# Patient Record
Sex: Male | Born: 1950 | Race: Black or African American | Hispanic: No | Marital: Married | Smoking: Never smoker
Health system: Southern US, Community
[De-identification: ages and names within clinical notes are randomized; demographics above are authoritative.]

## PROBLEM LIST (undated history)

## (undated) DIAGNOSIS — Z86010 Personal history of colonic polyps: Secondary | ICD-10-CM

## (undated) DIAGNOSIS — K5900 Constipation, unspecified: Secondary | ICD-10-CM

## (undated) DIAGNOSIS — I10 Essential (primary) hypertension: Secondary | ICD-10-CM

## (undated) DIAGNOSIS — M549 Dorsalgia, unspecified: Secondary | ICD-10-CM

## (undated) DIAGNOSIS — M1990 Unspecified osteoarthritis, unspecified site: Secondary | ICD-10-CM

## (undated) DIAGNOSIS — R011 Cardiac murmur, unspecified: Secondary | ICD-10-CM

## (undated) DIAGNOSIS — T4145XA Adverse effect of unspecified anesthetic, initial encounter: Secondary | ICD-10-CM

## (undated) DIAGNOSIS — Z860101 Personal history of adenomatous and serrated colon polyps: Secondary | ICD-10-CM

## (undated) DIAGNOSIS — M7989 Other specified soft tissue disorders: Secondary | ICD-10-CM

## (undated) DIAGNOSIS — T7840XA Allergy, unspecified, initial encounter: Secondary | ICD-10-CM

## (undated) DIAGNOSIS — T8859XA Other complications of anesthesia, initial encounter: Secondary | ICD-10-CM

## (undated) DIAGNOSIS — E739 Lactose intolerance, unspecified: Secondary | ICD-10-CM

## (undated) DIAGNOSIS — E785 Hyperlipidemia, unspecified: Secondary | ICD-10-CM

## (undated) DIAGNOSIS — M2550 Pain in unspecified joint: Secondary | ICD-10-CM

## (undated) HISTORY — DX: Essential (primary) hypertension: I10

## (undated) HISTORY — DX: Hyperlipidemia, unspecified: E78.5

## (undated) HISTORY — DX: Other specified soft tissue disorders: M79.89

## (undated) HISTORY — DX: Constipation, unspecified: K59.00

## (undated) HISTORY — DX: Unspecified osteoarthritis, unspecified site: M19.90

## (undated) HISTORY — DX: Lactose intolerance, unspecified: E73.9

## (undated) HISTORY — DX: Allergy, unspecified, initial encounter: T78.40XA

## (undated) HISTORY — DX: Dorsalgia, unspecified: M54.9

## (undated) HISTORY — DX: Pain in unspecified joint: M25.50

## (undated) HISTORY — PX: COLONOSCOPY: SHX174

---

## 2009-01-07 ENCOUNTER — Encounter (INDEPENDENT_AMBULATORY_CARE_PROVIDER_SITE_OTHER): Payer: Self-pay | Admitting: *Deleted

## 2009-01-23 ENCOUNTER — Ambulatory Visit: Payer: Self-pay | Admitting: Family Medicine

## 2009-01-23 DIAGNOSIS — E1165 Type 2 diabetes mellitus with hyperglycemia: Secondary | ICD-10-CM

## 2009-01-23 DIAGNOSIS — E1151 Type 2 diabetes mellitus with diabetic peripheral angiopathy without gangrene: Secondary | ICD-10-CM | POA: Insufficient documentation

## 2009-01-24 ENCOUNTER — Telehealth: Payer: Self-pay | Admitting: Family Medicine

## 2009-01-27 ENCOUNTER — Ambulatory Visit: Payer: Self-pay | Admitting: Family Medicine

## 2009-01-27 LAB — CONVERTED CEMR LAB
ALT: 20 units/L (ref 0–53)
AST: 21 units/L (ref 0–37)
Albumin: 3.7 g/dL (ref 3.5–5.2)
Alkaline Phosphatase: 55 units/L (ref 39–117)
BUN: 11 mg/dL (ref 6–23)
Basophils Absolute: 0.1 10*3/uL (ref 0.0–0.1)
Basophils Relative: 1.8 % (ref 0.0–3.0)
Bilirubin, Direct: 0 mg/dL (ref 0.0–0.3)
CO2: 28 meq/L (ref 19–32)
Calcium: 9.2 mg/dL (ref 8.4–10.5)
Chloride: 104 meq/L (ref 96–112)
Creatinine, Ser: 0.9 mg/dL (ref 0.4–1.5)
Creatinine,U: 126.3 mg/dL
Eosinophils Absolute: 0.1 10*3/uL (ref 0.0–0.7)
Eosinophils Relative: 1.8 % (ref 0.0–5.0)
GFR calc non Af Amer: 92.77 mL/min (ref >60)
Glucose, Bld: 163 mg/dL — ABNORMAL HIGH (ref 70–99)
HCT: 43.7 % (ref 39.0–52.0)
Hemoglobin: 14.4 g/dL (ref 13.0–17.0)
Hgb A1c MFr Bld: 8.3 % — ABNORMAL HIGH (ref 4.6–6.5)
Lymphocytes Relative: 19.2 % (ref 12.0–46.0)
Lymphs Abs: 1.5 10*3/uL (ref 0.7–4.0)
MCHC: 32.9 g/dL (ref 30.0–36.0)
MCV: 91.8 fL (ref 78.0–100.0)
Microalb Creat Ratio: 4 mg/g (ref 0.0–30.0)
Microalb, Ur: 0.5 mg/dL (ref 0.0–1.9)
Monocytes Absolute: 0.6 10*3/uL (ref 0.1–1.0)
Monocytes Relative: 7.3 % (ref 3.0–12.0)
Neutro Abs: 5.5 10*3/uL (ref 1.4–7.7)
Neutrophils Relative %: 69.9 % (ref 43.0–77.0)
PSA: 0.29 ng/mL (ref 0.10–4.00)
Platelets: 223 10*3/uL (ref 150.0–400.0)
Potassium: 4.3 meq/L (ref 3.5–5.1)
RBC: 4.76 M/uL (ref 4.22–5.81)
RDW: 13.4 % (ref 11.5–14.6)
Sodium: 139 meq/L (ref 135–145)
TSH: 0.75 microintl units/mL (ref 0.35–5.50)
Total Bilirubin: 0.6 mg/dL (ref 0.3–1.2)
Total Protein: 7.6 g/dL (ref 6.0–8.3)
WBC: 7.8 10*3/uL (ref 4.5–10.5)

## 2009-01-30 ENCOUNTER — Encounter (INDEPENDENT_AMBULATORY_CARE_PROVIDER_SITE_OTHER): Payer: Self-pay | Admitting: *Deleted

## 2009-01-30 ENCOUNTER — Telehealth (INDEPENDENT_AMBULATORY_CARE_PROVIDER_SITE_OTHER): Payer: Self-pay | Admitting: *Deleted

## 2009-02-03 ENCOUNTER — Encounter (INDEPENDENT_AMBULATORY_CARE_PROVIDER_SITE_OTHER): Payer: Self-pay | Admitting: *Deleted

## 2009-02-07 ENCOUNTER — Encounter: Payer: Self-pay | Admitting: Family Medicine

## 2009-02-13 ENCOUNTER — Ambulatory Visit: Payer: Self-pay | Admitting: Internal Medicine

## 2009-03-21 ENCOUNTER — Ambulatory Visit (HOSPITAL_COMMUNITY): Admission: RE | Admit: 2009-03-21 | Discharge: 2009-03-21 | Payer: Self-pay | Admitting: Internal Medicine

## 2009-03-21 ENCOUNTER — Ambulatory Visit: Payer: Self-pay | Admitting: Internal Medicine

## 2009-03-21 ENCOUNTER — Encounter: Payer: Self-pay | Admitting: Internal Medicine

## 2009-03-25 ENCOUNTER — Encounter: Payer: Self-pay | Admitting: Internal Medicine

## 2009-04-22 ENCOUNTER — Ambulatory Visit: Payer: Self-pay | Admitting: Family Medicine

## 2009-04-22 DIAGNOSIS — R12 Heartburn: Secondary | ICD-10-CM

## 2009-04-22 LAB — CONVERTED CEMR LAB
Bilirubin Urine: NEGATIVE
Blood Glucose, Fingerstick: 270
Blood in Urine, dipstick: NEGATIVE
Glucose, Urine, Semiquant: 250
Ketones, urine, test strip: NEGATIVE
Nitrite: NEGATIVE
Protein, U semiquant: NEGATIVE
Specific Gravity, Urine: 1.015
Urobilinogen, UA: 0.2
WBC Urine, dipstick: NEGATIVE
pH: 6.5

## 2009-05-01 ENCOUNTER — Telehealth: Payer: Self-pay | Admitting: Family Medicine

## 2009-05-05 ENCOUNTER — Ambulatory Visit: Payer: Self-pay | Admitting: Family Medicine

## 2009-05-05 DIAGNOSIS — E785 Hyperlipidemia, unspecified: Secondary | ICD-10-CM | POA: Insufficient documentation

## 2009-05-05 LAB — CONVERTED CEMR LAB
ALT: 26 units/L (ref 0–53)
AST: 22 units/L (ref 0–37)
Albumin: 3.8 g/dL (ref 3.5–5.2)
Alkaline Phosphatase: 69 units/L (ref 39–117)
BUN: 7 mg/dL (ref 6–23)
Bilirubin, Direct: 0 mg/dL (ref 0.0–0.3)
Blood Glucose, Fingerstick: 121
CO2: 28 meq/L (ref 19–32)
Calcium: 9.1 mg/dL (ref 8.4–10.5)
Chloride: 100 meq/L (ref 96–112)
Cholesterol: 188 mg/dL (ref 0–200)
Creatinine, Ser: 0.8 mg/dL (ref 0.4–1.5)
Creatinine,U: 126.6 mg/dL
GFR calc non Af Amer: 128.48 mL/min (ref >60)
Glucose, Bld: 245 mg/dL — ABNORMAL HIGH (ref 70–99)
HDL: 40.9 mg/dL (ref >39.00)
Hgb A1c MFr Bld: 10.2 % — ABNORMAL HIGH (ref 4.6–6.5)
LDL Cholesterol: 125 mg/dL — ABNORMAL HIGH (ref 0–99)
Microalb Creat Ratio: 3.9 mg/g (ref 0.0–30.0)
Microalb, Ur: 0.5 mg/dL (ref 0.0–1.9)
Potassium: 4.3 meq/L (ref 3.5–5.1)
Sodium: 136 meq/L (ref 135–145)
Total Bilirubin: 1 mg/dL (ref 0.3–1.2)
Total CHOL/HDL Ratio: 5
Total Protein: 7.6 g/dL (ref 6.0–8.3)
Triglycerides: 110 mg/dL (ref 0.0–149.0)
VLDL: 22 mg/dL (ref 0.0–40.0)

## 2009-06-18 ENCOUNTER — Encounter: Payer: Self-pay | Admitting: Family Medicine

## 2009-07-04 ENCOUNTER — Encounter: Payer: Self-pay | Admitting: Family Medicine

## 2009-07-04 DIAGNOSIS — I739 Peripheral vascular disease, unspecified: Secondary | ICD-10-CM | POA: Insufficient documentation

## 2009-07-08 ENCOUNTER — Encounter (INDEPENDENT_AMBULATORY_CARE_PROVIDER_SITE_OTHER): Payer: Self-pay | Admitting: *Deleted

## 2009-07-22 ENCOUNTER — Ambulatory Visit: Payer: Self-pay

## 2009-07-22 ENCOUNTER — Encounter: Payer: Self-pay | Admitting: Family Medicine

## 2009-08-06 ENCOUNTER — Telehealth (INDEPENDENT_AMBULATORY_CARE_PROVIDER_SITE_OTHER): Payer: Self-pay | Admitting: *Deleted

## 2009-08-06 ENCOUNTER — Ambulatory Visit: Payer: Self-pay | Admitting: Family Medicine

## 2009-08-06 LAB — CONVERTED CEMR LAB
ALT: 18 units/L (ref 0–53)
AST: 18 units/L (ref 0–37)
Albumin: 3.7 g/dL (ref 3.5–5.2)
Alkaline Phosphatase: 55 units/L (ref 39–117)
BUN: 11 mg/dL (ref 6–23)
Bilirubin, Direct: 0.1 mg/dL (ref 0.0–0.3)
CO2: 29 meq/L (ref 19–32)
Calcium: 9.1 mg/dL (ref 8.4–10.5)
Chloride: 107 meq/L (ref 96–112)
Cholesterol: 147 mg/dL (ref 0–200)
Creatinine, Ser: 0.9 mg/dL (ref 0.4–1.5)
Creatinine,U: 117.2 mg/dL
GFR calc non Af Amer: 112.04 mL/min (ref >60)
Glucose, Bld: 110 mg/dL — ABNORMAL HIGH (ref 70–99)
HDL: 47.5 mg/dL (ref >39.00)
Hgb A1c MFr Bld: 6.8 % — ABNORMAL HIGH (ref 4.6–6.5)
LDL Cholesterol: 85 mg/dL (ref 0–99)
Microalb Creat Ratio: 5.1 mg/g (ref 0.0–30.0)
Microalb, Ur: 0.6 mg/dL (ref 0.0–1.9)
Potassium: 4.3 meq/L (ref 3.5–5.1)
Sodium: 140 meq/L (ref 135–145)
Total Bilirubin: 0.4 mg/dL (ref 0.3–1.2)
Total CHOL/HDL Ratio: 3
Total Protein: 7.7 g/dL (ref 6.0–8.3)
Triglycerides: 75 mg/dL (ref 0.0–149.0)
VLDL: 15 mg/dL (ref 0.0–40.0)

## 2009-09-02 ENCOUNTER — Ambulatory Visit: Payer: Self-pay | Admitting: Family

## 2009-09-02 DIAGNOSIS — M5430 Sciatica, unspecified side: Secondary | ICD-10-CM

## 2009-09-22 ENCOUNTER — Telehealth (INDEPENDENT_AMBULATORY_CARE_PROVIDER_SITE_OTHER): Payer: Self-pay | Admitting: *Deleted

## 2009-11-04 ENCOUNTER — Telehealth (INDEPENDENT_AMBULATORY_CARE_PROVIDER_SITE_OTHER): Payer: Self-pay | Admitting: *Deleted

## 2009-11-19 ENCOUNTER — Encounter: Payer: Self-pay | Admitting: Family Medicine

## 2010-03-16 ENCOUNTER — Telehealth: Payer: Self-pay | Admitting: Family Medicine

## 2010-03-17 ENCOUNTER — Ambulatory Visit: Payer: Self-pay | Admitting: Family Medicine

## 2010-03-20 ENCOUNTER — Encounter: Payer: Self-pay | Admitting: Family Medicine

## 2010-03-20 LAB — CONVERTED CEMR LAB
ALT: 18 units/L (ref 0–53)
AST: 18 units/L (ref 0–37)
Albumin: 3.7 g/dL (ref 3.5–5.2)
Alkaline Phosphatase: 64 units/L (ref 39–117)
BUN: 15 mg/dL (ref 6–23)
Bilirubin, Direct: 0.1 mg/dL (ref 0.0–0.3)
CO2: 27 meq/L (ref 19–32)
Calcium: 9 mg/dL (ref 8.4–10.5)
Chloride: 106 meq/L (ref 96–112)
Cholesterol: 206 mg/dL — ABNORMAL HIGH (ref 0–200)
Creatinine, Ser: 1 mg/dL (ref 0.4–1.5)
Direct LDL: 148.7 mg/dL
GFR calc non Af Amer: 101.33 mL/min (ref >60)
Glucose, Bld: 147 mg/dL — ABNORMAL HIGH (ref 70–99)
HDL: 49.5 mg/dL (ref >39.00)
Hgb A1c MFr Bld: 8.3 % — ABNORMAL HIGH (ref 4.6–6.5)
Potassium: 4.4 meq/L (ref 3.5–5.1)
Sodium: 140 meq/L (ref 135–145)
Total Bilirubin: 0.5 mg/dL (ref 0.3–1.2)
Total CHOL/HDL Ratio: 4
Total Protein: 7.1 g/dL (ref 6.0–8.3)
Triglycerides: 108 mg/dL (ref 0.0–149.0)
VLDL: 21.6 mg/dL (ref 0.0–40.0)

## 2010-04-17 ENCOUNTER — Telehealth: Payer: Self-pay | Admitting: Family Medicine

## 2010-06-02 ENCOUNTER — Telehealth (INDEPENDENT_AMBULATORY_CARE_PROVIDER_SITE_OTHER): Payer: Self-pay | Admitting: *Deleted

## 2010-06-04 ENCOUNTER — Ambulatory Visit: Payer: Self-pay | Admitting: Family Medicine

## 2010-06-04 ENCOUNTER — Telehealth (INDEPENDENT_AMBULATORY_CARE_PROVIDER_SITE_OTHER): Payer: Self-pay | Admitting: *Deleted

## 2010-06-10 ENCOUNTER — Telehealth (INDEPENDENT_AMBULATORY_CARE_PROVIDER_SITE_OTHER): Payer: Self-pay | Admitting: *Deleted

## 2010-06-22 ENCOUNTER — Ambulatory Visit
Admission: RE | Admit: 2010-06-22 | Discharge: 2010-06-22 | Payer: Self-pay | Source: Home / Self Care | Attending: Family Medicine | Admitting: Family Medicine

## 2010-06-22 ENCOUNTER — Other Ambulatory Visit: Payer: Self-pay | Admitting: Family Medicine

## 2010-06-22 ENCOUNTER — Encounter: Payer: Self-pay | Admitting: Family Medicine

## 2010-06-22 LAB — BASIC METABOLIC PANEL
BUN: 9 mg/dL (ref 6–23)
CO2: 28 mEq/L (ref 19–32)
Calcium: 9 mg/dL (ref 8.4–10.5)
Chloride: 103 mEq/L (ref 96–112)
Creatinine, Ser: 0.9 mg/dL (ref 0.4–1.5)
GFR: 114.62 mL/min (ref >60.00)
Glucose, Bld: 146 mg/dL — ABNORMAL HIGH (ref 70–99)
Potassium: 4.3 mEq/L (ref 3.5–5.1)
Sodium: 138 mEq/L (ref 135–145)

## 2010-06-22 LAB — MICROALBUMIN / CREATININE URINE RATIO
Creatinine,U: 188.2 mg/dL
Microalb Creat Ratio: 0.8 mg/g (ref 0.0–30.0)
Microalb, Ur: 1.5 mg/dL (ref 0.0–1.9)

## 2010-07-10 ENCOUNTER — Ambulatory Visit
Admission: RE | Admit: 2010-07-10 | Discharge: 2010-07-10 | Payer: Self-pay | Source: Home / Self Care | Attending: Family Medicine | Admitting: Family Medicine

## 2010-07-10 DIAGNOSIS — S61209A Unspecified open wound of unspecified finger without damage to nail, initial encounter: Secondary | ICD-10-CM | POA: Insufficient documentation

## 2010-07-13 ENCOUNTER — Other Ambulatory Visit: Payer: Self-pay | Admitting: Family Medicine

## 2010-07-13 DIAGNOSIS — E119 Type 2 diabetes mellitus without complications: Secondary | ICD-10-CM

## 2010-07-14 NOTE — Letter (Signed)
Summary: Primary Care Consult Scheduled Letter  [facility] [place]  [address] [place], [place] [address]   Phone: [phone]  Fax: [phone]      07/08/2009 MRN: [id number]  [facility] [address] [place], [place]  [address]    Dear Richard [name],    We have scheduled an appointment for you.  At the recommendation of Dr. Loreen Freud, we have scheduled you with Selena Batten on 07-22-2009 at 10:00am for Lower Extremity Arterial Dopplers.  Their address is [address], [address]rd floor, [place] [address]. The office phone number is [phone].  If this appointment day and time is not convenient for you, please feel free to call the office of the doctor you are being referred to at the number listed above and reschedule the appointment.    It is important for you to keep your scheduled appointments. We are here to make sure you are given good patient care.   Thank you,    Renee, Patient Care Coordinator [facility] [place]    **IF YOU ARE UNABLE TO KEEP THIS APPOINTMENT, OR NEED TO RESCHEDULE, PLEASE GIVE  HEARTCARE A 24 HOUR NOTICE TO AVOID A $50 FEE**

## 2010-07-14 NOTE — Progress Notes (Signed)
Summary: Lab Results   Phone Note Outgoing Call   Call placed by: Army Fossa CMA,  August 06, 2009 3:42 PM Summary of Call: Regarding lab results, LMTCB:  Pravachol not strong enough------ change lipitor 20 mg  #30  1 by mouth at bedtime  2 refills------their are coupons in bin for $4 LDL goal < 70   recheck 3 ZOXWRU------045.40 272.4  lipid, hep, bmp, hgba1c   Signed by Loreen Freud DO on 08/06/2009 at 3:37 PM  Follow-up for Phone Call        Pt is aware. will pick up coupon. Army Fossa CMA  August 06, 2009 3:47 PM     New/Updated Medications: LIPITOR 20 MG TABS (ATORVASTATIN CALCIUM) 1 by mouth at beditme. Prescriptions: LIPITOR 20 MG TABS (ATORVASTATIN CALCIUM) 1 by mouth at beditme.  #30 x 2   Entered by:   Army Fossa CMA   Authorized by:   Loreen Freud DO   Signed by:   Army Fossa CMA on 08/06/2009   Method used:   Electronically to        [facility] #[id number]* (retail)       [address] [place], [place]  [address]       Ph: [phone]       Fax: [phone]   RxID:   [id number]

## 2010-07-14 NOTE — Miscellaneous (Signed)
Summary: Orders Update  Clinical Lists Changes  Problems: Added new problem of UNSPECIFIED PERIPHERAL VASCULAR DISEASE (ICD-443.9) Orders: Added new Test order of Arterial Duplex Lower Extremity (Arterial Duplex Low) - Signed 

## 2010-07-14 NOTE — Progress Notes (Signed)
Summary: refill  Phone Note Refill Request   Refills Requested: Medication #1:  VICTOZA 18 MG/3ML SOLN 1.2  subcutaneously daily  Medication #2:  METFORMIN HCL 500 MG XR24H-TAB 1 by mouth every day.  Medication #3:  CLOTRIMAZOLE-BETAMETHASONE 1-0.05 % CREA as directed [facility]......Marland KitchenFelecia Deloach CMA  March 16, 2010 4:30 PM    Follow-up for Phone Call        pls advise med never filled by you, last ov 09-02-09. pt has pending lab appt ...Marland KitchenMarland KitchenFelecia Deloach CMA  March 16, 2010 4:31 PM   Additional Follow-up for Phone Call Additional follow up Details #1::        ok to refill cream x1  1 refills Additional Follow-up by: Loreen Freud DO,  March 16, 2010 4:34 PM    Prescriptions: METFORMIN HCL 500 MG XR24H-TAB (METFORMIN HCL) 1 by mouth every day.  #30 x 0   Entered by:   Jeremy Johann CMA   Authorized by:   Loreen Freud DO   Signed by:   Jeremy Johann CMA on 03/16/2010   Method used:   Faxed to ...       [id number]* (retail)       [address] [place], [place]  [address]       Ph: [phone]       Fax: [phone]   RxID:   [id number] GMWNUUV 18 MG/3ML SOLN (LIRAGLUTIDE) 1.2  subcutaneously daily  #1 month x 0   Entered by:   Jeremy Johann CMA   Authorized by:   Loreen Freud DO   Signed by:   Jeremy Johann CMA on 03/16/2010   Method used:   Faxed to ...       [facility] #[id number]* (retail)       [address] [place], [place]  [address]       Ph: [phone]       Fax: [phone]   RxID:   [id number]

## 2010-07-14 NOTE — Miscellaneous (Signed)
  Clinical Lists Changes  Medications: Removed medication of NOVOLOG FLEXPEN 100 UNIT/ML SOLN (INSULIN ASPART) sliding scale

## 2010-07-14 NOTE — Assessment & Plan Note (Signed)
Summary: ROB--FOR INSU-PROCE-LABS--PH   Vital Signs:  Patient profile:   60 year old male Weight:      368 pounds Temp:     98.4 degrees F oral Pulse rate:   16 / minute Pulse rhythm:   regular BP sitting:   142 / 80  (left arm) Cuff size:   large  Vitals Entered By: Army Fossa CMA (August 06, 2009 9:10 AM) CC: Pt thinks he just needs labs- unsure of why appt was made.  Comments refills on victoza, pravastatin, metformin   History of Present Illness: pt was just supposed to be a lab visit ---  copay should be credited to accout  Allergies (verified): No Known Drug Allergies   Complete Medication List: 1)  Trital Dm 03-17-14 Mg/69ml Liqd (Phenylephrine-chlorphen-dm) .... Take 1 teasoopful q 4 hrs as needed for cough & congestion 2)  Glucophage Xr 500 Mg Xr24h-tab (Metformin hcl) .... 2 by mouth qpm 3)  Cyclobenzaprine Hcl 10 Mg Tabs (Cyclobenzaprine hcl) .... Take 1/2 tab by mouth two times a day for muscle spasm 4)  Silvadene 1 % Crea (Silver sulfadiazine) .... As directed 5)  Clotrimazole-betamethasone 1-0.05 % Crea (Clotrimazole-betamethasone) .... As directed 6)  Onetouch Ultra Test Strp (Glucose blood) .... Test two times a day 7)  Viagra 100 Mg Tabs (Sildenafil citrate) .... As directed 8)  Victoza 18 Mg/57ml Soln (Liraglutide) .... 1.2  subcutaneously daily 9)  Novolog Flexpen 100 Unit/ml Soln (Insulin aspart) .... Sliding scale 10)  Nexium 40 Mg Cpdr (Esomeprazole magnesium) .Marland Kitchen.. 1 by mouth once daily 11)  Metformin Hcl 500 Mg Xr24h-tab (Metformin hcl) .Marland Kitchen.. 1 by mouth every day. 12)  Novofine 32g X 6 Mm Misc (Insulin pen needle) .... Use as directed. 13)  Pravachol 40 Mg Tabs (Pravastatin sodium) .Marland Kitchen.. 1 by mouth at bedtime  Other Orders: Venipuncture ([address]) TLB-Lipid Panel (80061-LIPID) TLB-BMP (Basic Metabolic Panel-BMET) (80048-METABOL) TLB-Hepatic/Liver Function Pnl (80076-HEPATIC) TLB-A1C / Hgb A1C (Glycohemoglobin) (83036-A1C) TLB-Microalbumin/Creat  Ratio, Urine (82043-MALB) No Charge Patient Arrived (NCPA0) (NCPA0) Prescriptions: VICTOZA 18 MG/3ML SOLN (LIRAGLUTIDE) 1.2  subcutaneously daily  #1 month x 5   Entered and Authorized by:   Loreen Freud DO   Signed by:   Loreen Freud DO on 08/06/2009   Method used:   Electronically to        [facility] #[id number]* (retail)       [address] [place], [place]  [address]       Ph: [phone]       Fax: [phone]   RxID:   [id number] METFORMIN HCL 500 MG XR24H-TAB (METFORMIN HCL) 1 by mouth every day.  #30 x 5   Entered and Authorized by:   Loreen Freud DO   Signed by:   Loreen Freud DO on 08/06/2009   Method used:   Electronically to        [facility] #[id number]* (retail)       [address] [place], [place]  [address]       Ph: [phone]       Fax: [phone]   RxID:   [id number] PRAVACHOL 40 MG TABS (PRAVASTATIN SODIUM) 1 by mouth at bedtime  #30 x 5   Entered and Authorized by:   Loreen Freud DO   Signed by:   Loreen Freud DO on 08/06/2009   Method used:   Electronically to        [facility] [address] [id number]* (retail)       [address] [place], [place]  [address]       Ph: [phone]       Fax: [phone]   RxID:   [id number]

## 2010-07-14 NOTE — Letter (Signed)
Summary: [facility]  [facility]   Imported By: Lanelle Bal 11/27/2009 08:37:10  _____________________________________________________________________  External Attachment:    Type:   Image     Comment:   External Document

## 2010-07-14 NOTE — Assessment & Plan Note (Signed)
Summary: back spasms//lch   Vital Signs:  Patient profile:   60 year old male Weight:      371.0 pounds BMI:     53.43 Temp:     97.6 degrees F oral Pulse rate:   120 / minute Pulse rhythm:   regular Resp:     24 per minute BP sitting:   128 / 70  (left arm) Cuff size:   thigh  Vitals Entered By: Mervin Kung CMA (September 02, 2009 9:03 AM) CC: room 16   Back pain in lower back since yesterday. Pain now radiating down left leg.   Primary Care Provider:  Laury Axon  CC:  room 16   Back pain in lower back since yesterday. Pain now radiating down left leg.Marland Kitchen  History of Present Illness: Richard [name] is a 60 year old male who presents today with c/o low back pain radiating down his left leg.  Symptoms started yesterday, has history of the same about 5 years ago, but notes that he generally does not have low back pain.  Has taken advil without relief of his pain.  Tells me pain is 16/10.  Pain started after he picked some trash out of the trunk.  Allergies (verified): No Known Drug Allergies  Physical Exam  General:  Morbidly obese african [place] male who appears to be in a lot of pain.  Head:  Normocephalic and atraumatic without obvious abnormalities. No apparent alopecia or balding. Msk:  normal strength in bilateral extremities.  Exam limited by patient discomfort.   Extremities:  No clubbing, cyanosis, edema, or deformity noted with normal full range of motion of all joints.     Impression & Recommendations:  Problem # 1:  SCIATICA, LEFT (ICD-724.3) Assessment New Will plan to treat with flexeril, mobic, time.  I considered steroids, but given patient's history of insulin dependent diabetes, this would not be a good choice for him.  If no improvement consider referral to PT His updated medication list for this problem includes:    Cyclobenzaprine Hcl 10 Mg Tabs (Cyclobenzaprine hcl) .Marland Kitchen... Take 1/2 tab by mouth two times a day for muscle spasm    Flexeril 10 Mg Tab  (Cyclobenzaprine hcl) .Marland Kitchen... Take one tablet by mouth three times a day as needed    Mobic 7.5 Mg Tabs (Meloxicam) ..... One tablet by mouth daily as needed for back pain  Complete Medication List: 1)  Trital Dm 03-17-14 Mg/28ml Liqd (Phenylephrine-chlorphen-dm) .... Take 1 teasoopful q 4 hrs as needed for cough & congestion 2)  Glucophage Xr 500 Mg Xr24h-tab (Metformin hcl) .... 2 by mouth qpm 3)  Cyclobenzaprine Hcl 10 Mg Tabs (Cyclobenzaprine hcl) .... Take 1/2 tab by mouth two times a day for muscle spasm 4)  Silvadene 1 % Crea (Silver sulfadiazine) .... As directed 5)  Clotrimazole-betamethasone 1-0.05 % Crea (Clotrimazole-betamethasone) .... As directed 6)  Onetouch Ultra Test Strp (Glucose blood) .... Test two times a day 7)  Viagra 100 Mg Tabs (Sildenafil citrate) .... As directed 8)  Victoza 18 Mg/9ml Soln (Liraglutide) .... 1.2  subcutaneously daily 9)  Novolog Flexpen 100 Unit/ml Soln (Insulin aspart) .... Sliding scale 10)  Nexium 40 Mg Cpdr (Esomeprazole magnesium) .Marland Kitchen.. 1 by mouth once daily 11)  Metformin Hcl 500 Mg Xr24h-tab (Metformin hcl) .Marland Kitchen.. 1 by mouth every day. 12)  Novofine 32g X 6 Mm Misc (Insulin pen needle) .... Use as directed. 13)  Lipitor 20 Mg Tabs (Atorvastatin calcium) .Marland Kitchen.. 1 by mouth at beditme. 14)  Flexeril  10 Mg Tab (Cyclobenzaprine hcl) .... Take one tablet by mouth three times a day as needed 15)  Mobic 7.5 Mg Tabs (Meloxicam) .... One tablet by mouth daily as needed for back pain  Patient Instructions: 1)  Most patients (90%) with low back pain will improve with time (2-6 weeks). Keep active but avoid activities that are painful. Apply moist heat and/or ice to lower back several times a day. 2)  Please schedule a follow-up appointment in 2 weeks. Prescriptions: MOBIC 7.5 MG TABS (MELOXICAM) one tablet by mouth daily as needed for back pain  #30 x 0   Entered and Authorized by:   Lemont Fillers FNP   Signed by:   Lemont Fillers FNP on  09/02/2009   Method used:   Electronically to        [facility] #[id number]* (retail)       [address] [place], [place]  [address]       Ph: [phone]       Fax: [phone]   RxID:   [id number] FLEXERIL 10 MG TAB (CYCLOBENZAPRINE HCL) Take one tablet by mouth three times a day as needed  #30 x 0   Entered and Authorized by:   Lemont Fillers FNP   Signed by:   Lemont Fillers FNP on 09/02/2009   Method used:   Electronically to        [facility] #[id number]* (retail)       [address] [place], [place]  [address]       Ph: [phone]       Fax: [phone]   RxID:   [id number]   Current Allergies (reviewed today): No known allergies

## 2010-07-14 NOTE — Progress Notes (Signed)
Summary: quantity limitation on test strips  Phone Note Refill Request Message from:  Fax from Pharmacy on September 22, 2009 3:17 PM  Refills Requested: Medication #1:  ONETOUCH ULTRA TEST  STRP test two times a day prior author fax [phone] [address]   Method Requested: Fax to Local Pharmacy Next Appointment Scheduled: no appt Initial call taken by: Barb Merino,  September 22, 2009 3:18 PM  Follow-up for Phone Call        spoke with  [employer] who says NO PRIOR AUTH IS REQUIRED pt plan pays for a quantity of 153 in a 90 day period. patient has the option of paying out of pocket for the rest. pt test two times a day.  PHARMACIST INFORMED.Kandice Hams  September 22, 2009 4:03 PM  Follow-up by: Kandice Hams,  September 22, 2009 4:17 PM

## 2010-07-14 NOTE — Letter (Signed)
Summary: Letter Regarding Screening for Peripheral Arterial Disease/Activ  Letter Regarding Screening for Peripheral Arterial Disease/[employer]   Imported By: Lanelle Bal 07/01/2009 14:05:36  _____________________________________________________________________  External Attachment:    Type:   Image     Comment:   External Document  Appended Document: Orders Update Inform pt that ins is requesting ABI secondary to DM   Clinical Lists Changes  Orders: Added new Referral order of LE Arterial Doppler/ABI (Le arterial doppler) - Signed      Appended Document: Letter Regarding Screening for Peripheral Arterial Disease/Activ Pt states he no longer has insurance through [employer] anymore. Marland Kitchen

## 2010-07-14 NOTE — Progress Notes (Signed)
Summary: Refill Request  Phone Note Refill Request Message from:  Patient on April 17, 2010 4:20 PM  Refills Requested: Medication #1:  CLOTRIMAZOLE-BETAMETHASONE 1-0.05 % CREA as directed Never filled here, per pt he uses as needed twice a day.Marland KitchenMarland KitchenPlease advise   Method Requested: Electronic Initial call taken by: Almeta Monas CMA Duncan Dull),  April 17, 2010 4:20 PM  Follow-up for Phone Call        ok to fill 30 g tube  1 refill Follow-up by: Loreen Freud DO,  April 17, 2010 8:00 PM    Prescriptions: CLOTRIMAZOLE-BETAMETHASONE 1-0.05 % CREA (CLOTRIMAZOLE-BETAMETHASONE) as directed  #30gram x 1   Entered by:   Almeta Monas CMA (AAMA)   Authorized by:   Loreen Freud DO   Signed by:   Almeta Monas CMA (AAMA) on 04/20/2010   Method used:   Electronically to        [id number]* (retail)       [address] [place], [place]  [address]       Ph: [phone]       Fax: [phone]   RxID:   [id number]

## 2010-07-14 NOTE — Progress Notes (Signed)
Summary: prior auth denied  testing  changed  Phone Note Refill Request Message from:  Pharmacy on [facility] [address]  Refills Requested: Medication #1:  ONETOUCH ULTRA TEST  STRP test two times a day ph [phone] fax [phone] id:[id number].............Marland KitchenFelecia Deloach CMA  Nov 04, 2009 10:29 AM    Follow-up for Phone Call        prior Berkley Harvey can not be approved for two times a day testing due to pt not on insulin. ok to change direction to once daily testing. pls advise.Marti Sleigh Deloach CMA  Nov 04, 2009 10:28 AM   Additional Follow-up for Phone Call Additional follow up Details #1::        yes Additional Follow-up by: Loreen Freud DO,  Nov 04, 2009 10:52 AM    Additional Follow-up for Phone Call Additional follow up Details #2::    left message to call  office to inform pt on change in test.............Marland KitchenFelecia Deloach CMA  Nov 04, 2009 3:08 PM   DISCUSS WITH PATIENT new rx sent to pharmacy...........Marland KitchenFelecia Deloach CMA  Nov 05, 2009 10:40 AM   New/Updated Medications: ONETOUCH ULTRA TEST  STRP (GLUCOSE BLOOD) test once daily Prescriptions: ONETOUCH ULTRA TEST  STRP (GLUCOSE BLOOD) test once daily  #100 x 0   Entered by:   Jeremy Johann CMA   Authorized by:   Loreen Freud DO   Signed by:   Jeremy Johann CMA on 11/05/2009   Method used:   Faxed to ...       [facility] #[id number]* (retail)       [address] [place], [place]  [address]       Ph: [phone]       Fax: [phone]   RxID:   [id number]

## 2010-07-16 ENCOUNTER — Other Ambulatory Visit: Payer: Self-pay

## 2010-07-16 NOTE — Progress Notes (Signed)
Summary: needs chest xray 12/20  Phone Note Call from Patient   Caller: Patient Call For: Loreen Freud DO Details for Reason: Needs CXR Summary of Call: call from patient and he stated he needed to have a PPD done but has had positive results in the past and would like an order for a chest X-Ray. Pt stated he is due to start a new job and needs to have this done soon. c/b # [phone]. Please advise... Initial call taken by: Almeta Monas CMA Duncan Dull),  June 02, 2010 8:05 AM  Follow-up for Phone Call        order printed Follow-up by: Loreen Freud DO,  June 02, 2010 12:14 PM  Additional Follow-up for Phone Call Additional follow up Details #1::        Left message to call back.... Almeta Monas CMA Duncan Dull)  June 02, 2010 1:23 PM   New Problems: SCREENING EXAMINATION FOR PULMONARY TUBERCULOSIS (ICD-V74.1)   Additional Follow-up for Phone Call Additional follow up Details #2::    spoke with pt and Orders left upfront, He also requested a One touch Glucometer, I left that upfront as well.... Almeta Monas CMA Duncan Dull)  June 03, 2010 11:03 AM   New Problems: SCREENING EXAMINATION FOR PULMONARY TUBERCULOSIS (ICD-V74.1)

## 2010-07-16 NOTE — Progress Notes (Signed)
Summary: Sample request  Phone Note Call from Patient   Caller: Patient Call For: Loreen Freud DO Details for Reason: Wants sample of Victoza Summary of Call: Call from patient and he stated he was out of the Victoza and he will not have any money until the 3rd of Jan. I advised I have one sample avail. He stated he needed it because he did not have enough Victoza to last him this month. Stated he can come by and pick it up this morning. I left in the Fridge for patient. Initial call taken by: Almeta Monas CMA Duncan Dull),  June 10, 2010 9:01 AM

## 2010-07-16 NOTE — Progress Notes (Signed)
Summary: Questions about Victoza--12/30  Phone Note Call from Patient Call back at [phone]   Caller: Patient Call For: Loreen Freud DO Details for Reason: Victoza concerns Summary of Call: spoke with patient when he came in to pick up sample of Victoza. Stated he is not sure if the meds are working as good as they should and not sure if he needs to be on something different. Stating he is taking 1.8 of the Victoza in the morning and Metformin 500mg  in the morning as well. His blood sugars had been running from 135-145 in the am and usually in the evening it is lower normally about 100. He wanted to know if he needed to adjust the meds or start something new. Pt wanted to wait to hear from Dr.lowne and is aware she will not be back until Friday 12/30....  Please advise   Follow-up for Phone Call        He is due for labs in January---- recheck labs first then we will discuss  250.00  272.4  lipid, hep, bmp, hgba1c, microalbumin ----  ov 10-14 days after labs Follow-up by: Loreen Freud DO,  June 12, 2010 8:59 AM  Additional Follow-up for Phone Call Additional follow up Details #1::        Left message to call back... Almeta Monas CMA Duncan Dull)  June 12, 2010 11:26 AM  pt aware, lab appt scheduled already.... Almeta Monas CMA Duncan Dull)  June 12, 2010 11:34 AM'

## 2010-07-16 NOTE — Assessment & Plan Note (Signed)
Summary: Review [facility]//Kp   Vital Signs:  Patient profile:   60 year old male Height:      70 inches Weight:      384.6 pounds BMI:     55.38 BP sitting:   138 / 72  (left arm) Cuff size:   thigh  Vitals Entered By: Almeta Monas CMA Duncan Dull) (July 10, 2010 9:44 AM) CC: Review [facility]--Needs refills   History of Present Illness: Pt here to review labs .Marland Kitchen   He cut his L thumb this am at home on aluminum foil.  No other complaints.    Problems Prior to Update: 1)  Screening Examination For Pulmonary Tuberculosis  (ICD-V74.1) 2)  Sciatica, Left  (ICD-724.3) 3)  Unspecified Peripheral Vascular Disease  (ICD-443.9) 4)  Hyperlipidemia  (ICD-272.4) 5)  Heartburn  (ICD-787.1) 6)  Preventive Health Care  (ICD-V70.0) 7)  Diabetes Mellitus, Type II  (ICD-250.00)  Medications Prior to Update: 1)  Trital Dm 03-17-14 Mg/71ml Liqd (Phenylephrine-Chlorphen-Dm) .... Take 1 Teasoopful Q 4 Hrs As Needed For Cough & Congestion 2)  Glucophage Xr 500 Mg Xr24h-Tab (Metformin Hcl) .... 2 By Mouth Qpm 3)  Cyclobenzaprine Hcl 10 Mg Tabs (Cyclobenzaprine Hcl) .... Take 1/2 Tab By Mouth Two Times A Day For Muscle Spasm 4)  Silvadene 1 % Crea (Silver Sulfadiazine) .... As Directed 5)  Clotrimazole-Betamethasone 1-0.05 % Crea (Clotrimazole-Betamethasone) .... As Directed 6)  Onetouch Ultra Test  Strp (Glucose Blood) .... Test Once Daily 7)  Viagra 100 Mg Tabs (Sildenafil Citrate) .... As Directed 8)  Victoza 18 Mg/27ml Soln (Liraglutide) .... 1.8 Subcutaneously Daily 9)  Nexium 40 Mg Cpdr (Esomeprazole Magnesium) .Marland Kitchen.. 1 By Mouth Once Daily 10)  Metformin Hcl 500 Mg Xr24h-Tab (Metformin Hcl) .Marland Kitchen.. 1 By Mouth Every Day. 11)  Novofine 32g X 6 Mm Misc (Insulin Pen Needle) .... Use As Directed. 12)  Lipitor 40 Mg Tabs (Atorvastatin Calcium) .Marland Kitchen.. 1 By Mouth Daily At Bedtime. 13)  Flexeril 10 Mg Tab (Cyclobenzaprine Hcl) .... Take One Tablet By Mouth Three Times A Day As Needed 14)  Mobic  7.5 Mg Tabs (Meloxicam) .... One Tablet By Mouth Daily As Needed For Back Pain 15)  Onetouch Delica Lancets  Misc (Lancets) .... Use As Directed 16)  Onetouch Ultra Blue  Strp (Glucose Blood) .... Use As Directed  Current Medications (verified): 1)  Trital Dm 03-17-14 Mg/17ml Liqd (Phenylephrine-Chlorphen-Dm) .... Take 1 Teasoopful Q 4 Hrs As Needed For Cough & Congestion 2)  Glucophage Xr 500 Mg Xr24h-Tab (Metformin Hcl) .... 2 By Mouth Qpm 3)  Cyclobenzaprine Hcl 10 Mg Tabs (Cyclobenzaprine Hcl) .... Take 1/2 Tab By Mouth Two Times A Day For Muscle Spasm 4)  Silvadene 1 % Crea (Silver Sulfadiazine) .... As Directed 5)  Clotrimazole-Betamethasone 1-0.05 % Crea (Clotrimazole-Betamethasone) .... As Directed 6)  Onetouch Ultra Test  Strp (Glucose Blood) .... Test Once Daily 7)  Viagra 100 Mg Tabs (Sildenafil Citrate) .... As Directed 8)  Victoza 18 Mg/5ml Soln (Liraglutide) .... 1.8 Subcutaneously Daily 9)  Nexium 40 Mg Cpdr (Esomeprazole Magnesium) .Marland Kitchen.. 1 By Mouth Once Daily 10)  Novofine 32g X 6 Mm Misc (Insulin Pen Needle) .... Use As Directed. 11)  Lipitor 40 Mg Tabs (Atorvastatin Calcium) .Marland Kitchen.. 1 By Mouth Daily At Bedtime. 12)  Flexeril 10 Mg Tab (Cyclobenzaprine Hcl) .... Take One Tablet By Mouth Three Times A Day As Needed 13)  Mobic 7.5 Mg Tabs (Meloxicam) .... One Tablet By Mouth Daily As Needed For Back Pain 14)  Onetouch Delica Lancets  Misc (Lancets) .... Use As Directed 15)  Onetouch Ultra Blue  Strp (Glucose Blood) .... Use As Directed  Allergies (verified): No Known Drug Allergies  Past History:  Past Medical History: Last updated: 05/05/2009 Diabetes mellitus, type II Hyperlipidemia  Past Surgical History: Last updated: 01/23/2009 none  Family History: Last updated: 01/23/2009 DM- MOTHER, SISITER, BROTHER HTN-SISTER STROKE-SISTER  Social History: Last updated: 01/23/2009 Married Never Smoked Alcohol use-yes Drug use-no Regular exercise-no  Risk  Factors: Alcohol Use: <1 (01/23/2009) Caffeine Use: 2 CUPS A DAY (01/23/2009) Exercise: no (01/23/2009)  Risk Factors: Smoking Status: never (01/23/2009)  Family History: Reviewed history from 01/23/2009 and no changes required. DM- MOTHER, SISITER, BROTHER HTN-SISTER STROKE-SISTER  Social History: Reviewed history from 01/23/2009 and no changes required. Married Never Smoked Alcohol use-yes Drug use-no Regular exercise-no  Review of Systems      See HPI  Physical Exam  General:  Well-developed,well-nourished,in no acute distress; alert,appropriate and cooperative throughout examination Psych:  Oriented X3 and normally interactive.     Impression & Recommendations:  Problem # 1:  HYPERLIPIDEMIA (ICD-272.4) see [facility] --scanned in His updated medication list for this problem includes:    Lipitor 40 Mg Tabs (Atorvastatin calcium) .Marland Kitchen... 1 by mouth daily at bedtime.  Labs Reviewed: SGOT: 18 (03/17/2010)   SGPT: 18 (03/17/2010)   HDL:49.50 (03/17/2010), 47.50 (08/06/2009)  LDL:85 (08/06/2009), 125 (16/03/9603)  Chol:206 (03/17/2010), 147 (08/06/2009)  Trig:108.0 (03/17/2010), 75.0 (08/06/2009)  Problem # 2:  DIABETES MELLITUS, TYPE II (ICD-250.00) Increase glucophage 2 by mouth once daily.    The following medications were removed from the medication list:    Metformin Hcl 500 Mg Xr24h-tab (Metformin hcl) .Marland Kitchen... 1 by mouth every day. His updated medication list for this problem includes:    Glucophage Xr 500 Mg Xr24h-tab (Metformin hcl) .Marland Kitchen... 2 by mouth qpm    Victoza 18 Mg/51ml Soln (Liraglutide) .Marland Kitchen... 1.8 subcutaneously daily  Orders: Radiology Referral (Radiology)  Labs Reviewed: Creat: 0.9 (06/22/2010)    Reviewed HgBA1c results: 8.3 (03/17/2010)  6.8 (08/06/2009)  Problem # 3:  Family Hx of SOLITARY KIDNEY (ICD-753.0)  Orders: Radiology Referral (Radiology)  Problem # 4:  WOUND, FINGER (ICD-883.0)  Complete Medication List: 1)  Trital Dm  03-17-14 Mg/39ml Liqd (Phenylephrine-chlorphen-dm) .... Take 1 teasoopful q 4 hrs as needed for cough & congestion 2)  Glucophage Xr 500 Mg Xr24h-tab (Metformin hcl) .... 2 by mouth qpm 3)  Cyclobenzaprine Hcl 10 Mg Tabs (Cyclobenzaprine hcl) .... Take 1/2 tab by mouth two times a day for muscle spasm 4)  Silvadene 1 % Crea (Silver sulfadiazine) .... As directed 5)  Clotrimazole-betamethasone 1-0.05 % Crea (Clotrimazole-betamethasone) .... As directed 6)  Onetouch Ultra Test Strp (Glucose blood) .... Test once daily 7)  Viagra 100 Mg Tabs (Sildenafil citrate) .... As directed 8)  Victoza 18 Mg/3ml Soln (Liraglutide) .... 1.8 subcutaneously daily 9)  Nexium 40 Mg Cpdr (Esomeprazole magnesium) .Marland Kitchen.. 1 by mouth once daily 10)  Novofine 32g X 6 Mm Misc (Insulin pen needle) .... Use as directed. 11)  Lipitor 40 Mg Tabs (Atorvastatin calcium) .Marland Kitchen.. 1 by mouth daily at bedtime. 12)  Flexeril 10 Mg Tab (Cyclobenzaprine hcl) .... Take one tablet by mouth three times a day as needed 13)  Mobic 7.5 Mg Tabs (Meloxicam) .... One tablet by mouth daily as needed for back pain 14)  Onetouch Delica Lancets Misc (Lancets) .... Use as directed 15)  Onetouch Ultra Blue Strp (Glucose blood) .... Use as directed  Patient Instructions: 1)  f/u with dietician -- that is coming here 2)  recheck labs 3 months----  272.4  [facility], bmp Prescriptions: LIPITOR 40 MG TABS (ATORVASTATIN CALCIUM) 1 by mouth daily at bedtime.  #30 x 2   Entered by:   Almeta Monas CMA (AAMA)   Authorized by:   Loreen Freud DO   Signed by:   Almeta Monas CMA (AAMA) on 07/10/2010   Method used:   Faxed to ...       [facility] #[id number]* (retail)       [address] [place], [place]  [address]       Ph: [phone]       Fax: [phone]   RxID:   [id number] VOZDGUY 18 MG/3ML SOLN (LIRAGLUTIDE) 1.8 subcutaneously daily  #1 month x 2   Entered by:   Almeta Monas CMA (AAMA)   Authorized by:   Loreen Freud  DO   Signed by:   Almeta Monas CMA (AAMA) on 07/10/2010   Method used:   Faxed to ...       [facility] #[id number]* (retail)       [address] [place], [place]  [address]       Ph: [phone]       Fax: [phone]   RxID:   [id number] GLUCOPHAGE XR 500 MG XR24H-TAB (METFORMIN HCL) 2 by mouth qpm  #60 x 2   Entered and Authorized by:   Loreen Freud DO   Signed by:   Loreen Freud DO on 07/10/2010   Method used:   Electronically to        [facility] #[id number]* (retail)       [address] [place], [place]  [address]       Ph: [phone]       Fax: [phone]   RxID:   [id number]    Orders Added: 1)  Radiology Referral [Radiology] 2)  Est. Patient Level III [[address]]

## 2010-07-16 NOTE — Progress Notes (Signed)
Summary: Rx fof lancets and test strips  Phone Note Outgoing Call Call back at [phone] wife cell   Caller: Patient Call placed by: Almeta Monas CMA Duncan Dull),  June 04, 2010 3:14 PM Call placed to: Patient Summary of Call: Advise of chext Xray results.Marland KitchenMarland KitchenMarland KitchenPt voiced understanding. Stated that he needed lancet and test strips faxed to [facility] [address].    New/Updated Medications: ONETOUCH DELICA LANCETS  MISC (LANCETS) use as directed ONETOUCH ULTRA BLUE  STRP (GLUCOSE BLOOD) use as directed Prescriptions: ONETOUCH ULTRA BLUE  STRP (GLUCOSE BLOOD) use as directed  #100 x 1   Entered by:   Almeta Monas CMA (AAMA)   Authorized by:   Loreen Freud DO   Signed by:   Almeta Monas CMA (AAMA) on 06/04/2010   Method used:   Faxed to ...       [facility] #[id number]* (retail)       [address] [place], [place]  [address]       Ph: [phone]       Fax: [phone]   RxID:   [id number] Dola Argyle LANCETS  MISC (LANCETS) use as directed  #100 x 1   Entered by:   Almeta Monas CMA (AAMA)   Authorized by:   Loreen Freud DO   Signed by:   Almeta Monas CMA (AAMA) on 06/04/2010   Method used:   Faxed to ...       [facility] #[id number]* (retail)       [address] [place], [place]  [address]       Ph: [phone]       Fax: [phone]   RxID:   [id number]

## 2010-07-17 ENCOUNTER — Encounter: Payer: Self-pay | Admitting: Family Medicine

## 2010-07-17 ENCOUNTER — Ambulatory Visit
Admission: RE | Admit: 2010-07-17 | Discharge: 2010-07-17 | Disposition: A | Discharge status: Home / Self Care | Payer: BC Managed Care – PPO | Source: Ambulatory Visit | Attending: Family Medicine | Admitting: Family Medicine

## 2010-07-17 DIAGNOSIS — E119 Type 2 diabetes mellitus without complications: Secondary | ICD-10-CM

## 2010-08-17 ENCOUNTER — Ambulatory Visit (INDEPENDENT_AMBULATORY_CARE_PROVIDER_SITE_OTHER): Payer: BC Managed Care – PPO | Admitting: Family Medicine

## 2010-08-17 ENCOUNTER — Encounter: Payer: Self-pay | Admitting: Family Medicine

## 2010-08-17 DIAGNOSIS — J019 Acute sinusitis, unspecified: Secondary | ICD-10-CM

## 2010-08-17 DIAGNOSIS — J209 Acute bronchitis, unspecified: Secondary | ICD-10-CM

## 2010-08-25 NOTE — Assessment & Plan Note (Signed)
Summary: Sinus Infection//KP   Vital Signs:  Patient profile:   60 year old male Weight:      380.6 pounds Temp:     98.6 degrees F oral BP sitting:   130 / 70  (left arm) Cuff size:   thigh  Vitals Entered By: Almeta Monas CMA Duncan Dull) (August 17, 2010 10:23 AM) CC: x4days c/o sinus inf, URI symptoms   History of Present Illness:       This is a 60 year old man who presents with URI symptoms.  b/l sinus pressure.   Using dayquil and nyquil with no relief.  The patient complains of nasal congestion, purulent nasal discharge, productive cough, and sick contacts.  The patient also reports headache.  The patient denies itchy watery eyes, itchy throat, sneezing, seasonal symptoms, response to antihistamine, muscle aches, and severe fatigue.  The patient denies the following risk factors for Strep sinusitis: unilateral facial pain, unilateral nasal discharge, poor response to decongestant, double sickening, tooth pain, Strep exposure, tender adenopathy, and absence of cough.    Current Medications (verified): 1)  Trital Dm 03-17-14 Mg/57ml Liqd (Phenylephrine-Chlorphen-Dm) .... Take 1 Teasoopful Q 4 Hrs As Needed For Cough & Congestion 2)  Glucophage Xr 500 Mg Xr24h-Tab (Metformin Hcl) .... 2 By Mouth Qpm 3)  Cyclobenzaprine Hcl 10 Mg Tabs (Cyclobenzaprine Hcl) .... Take 1/2 Tab By Mouth Two Times A Day For Muscle Spasm 4)  Silvadene 1 % Crea (Silver Sulfadiazine) .... As Directed 5)  Clotrimazole-Betamethasone 1-0.05 % Crea (Clotrimazole-Betamethasone) .... As Directed 6)  Onetouch Ultra Test  Strp (Glucose Blood) .... Test Once Daily 7)  Viagra 100 Mg Tabs (Sildenafil Citrate) .... As Directed 8)  Victoza 18 Mg/35ml Soln (Liraglutide) .... 1.8 Subcutaneously Daily 9)  Nexium 40 Mg Cpdr (Esomeprazole Magnesium) .Marland Kitchen.. 1 By Mouth Once Daily 10)  Novofine 32g X 6 Mm Misc (Insulin Pen Needle) .... Use As Directed. 11)  Lipitor 40 Mg Tabs (Atorvastatin Calcium) .Marland Kitchen.. 1 By Mouth Daily At Bedtime. 12)   Flexeril 10 Mg Tab (Cyclobenzaprine Hcl) .... Take One Tablet By Mouth Three Times A Day As Needed 13)  Mobic 7.5 Mg Tabs (Meloxicam) .... One Tablet By Mouth Daily As Needed For Back Pain 14)  Onetouch Delica Lancets  Misc (Lancets) .... Use As Directed 15)  Onetouch Ultra Blue  Strp (Glucose Blood) .... Use As Directed  Allergies (verified): No Known Drug Allergies  Physical Exam  General:  Well-developed,well-nourished,in no acute distress; alert,appropriate and cooperative throughout examination Ears:  External ear exam shows no significant lesions or deformities.  Otoscopic examination reveals clear canals, tympanic membranes are intact bilaterally without bulging, retraction, inflammation or discharge. Hearing is grossly normal bilaterally. Nose:  L frontal sinus tenderness, L maxillary sinus tenderness, R frontal sinus tenderness, and R maxillary sinus tenderness.   Mouth:  Oral mucosa and oropharynx without lesions or exudates.  Teeth in good repair. Neck:  No deformities, masses, or tenderness noted. Lungs:  R decreased breath sounds and L decreased breath sounds.   Heart:  normal rate and no murmur.   Extremities:  No clubbing, cyanosis, edema, or deformity noted with normal full range of motion of all joints.   Psych:  Oriented X3 and good eye contact.     Impression & Recommendations:  Problem # 1:  SINUSITIS - ACUTE-NOS (ICD-461.9)  His updated medication list for this problem includes:    Trital Dm 03-17-14 Mg/14ml Liqd (Phenylephrine-chlorphen-dm) .Marland Kitchen... Take 1 teasoopful q 4 hrs as needed for  cough & congestion    Ceftin 500 Mg Tabs (Cefuroxime axetil) .Marland Kitchen... 1 by mouth two times a day    Cheratussin Ac 100-10 Mg/58ml Syrp (Guaifenesin-codeine) .Marland Kitchen... 1-2 tsp by mouth at bedtime as needed cough    Veramyst 27.5 Mcg/spray Susp (Fluticasone furoate) .Marland Kitchen... 2 sprays each nostril once daily  Problem # 2:  BRONCHITIS- ACUTE (ICD-466.0)  His updated medication list for this  problem includes:    Trital Dm 03-17-14 Mg/33ml Liqd (Phenylephrine-chlorphen-dm) .Marland Kitchen... Take 1 teasoopful q 4 hrs as needed for cough & congestion    Ceftin 500 Mg Tabs (Cefuroxime axetil) .Marland Kitchen... 1 by mouth two times a day    Cheratussin Ac 100-10 Mg/33ml Syrp (Guaifenesin-codeine) .Marland Kitchen... 1-2 tsp by mouth at bedtime as needed cough    Mucinex for daytime  Complete Medication List: 1)  Trital Dm 03-17-14 Mg/36ml Liqd (Phenylephrine-chlorphen-dm) .... Take 1 teasoopful q 4 hrs as needed for cough & congestion 2)  Glucophage Xr 500 Mg Xr24h-tab (Metformin hcl) .... 2 by mouth qpm 3)  Cyclobenzaprine Hcl 10 Mg Tabs (Cyclobenzaprine hcl) .... Take 1/2 tab by mouth two times a day for muscle spasm 4)  Silvadene 1 % Crea (Silver sulfadiazine) .... As directed 5)  Clotrimazole-betamethasone 1-0.05 % Crea (Clotrimazole-betamethasone) .... As directed 6)  Onetouch Ultra Test Strp (Glucose blood) .... Test once daily 7)  Viagra 100 Mg Tabs (Sildenafil citrate) .... As directed 8)  Victoza 18 Mg/78ml Soln (Liraglutide) .... 1.8 subcutaneously daily 9)  Nexium 40 Mg Cpdr (Esomeprazole magnesium) .Marland Kitchen.. 1 by mouth once daily 10)  Novofine 32g X 6 Mm Misc (Insulin pen needle) .... Use as directed. 11)  Lipitor 40 Mg Tabs (Atorvastatin calcium) .Marland Kitchen.. 1 by mouth daily at bedtime. 12)  Flexeril 10 Mg Tab (Cyclobenzaprine hcl) .... Take one tablet by mouth three times a day as needed 13)  Mobic 7.5 Mg Tabs (Meloxicam) .... One tablet by mouth daily as needed for back pain 14)  Onetouch Delica Lancets Misc (Lancets) .... Use as directed 15)  Onetouch Ultra Blue Strp (Glucose blood) .... Use as directed 16)  Ceftin 500 Mg Tabs (Cefuroxime axetil) .Marland Kitchen.. 1 by mouth two times a day 17)  Cheratussin Ac 100-10 Mg/79ml Syrp (Guaifenesin-codeine) .Marland Kitchen.. 1-2 tsp by mouth at bedtime as needed cough 18)  Veramyst 27.5 Mcg/spray Susp (Fluticasone furoate) .... 2 sprays each nostril once daily Prescriptions: CHERATUSSIN AC 100-10 MG/5ML  SYRP (GUAIFENESIN-CODEINE) 1-2 tsp by mouth at bedtime as needed cough  #6 oz x 0   Entered and Authorized by:   Loreen Freud DO   Signed by:   Loreen Freud DO on 08/17/2010   Method used:   Print then Give to Patient   RxID:   [id number] CEFTIN 500 MG TABS (CEFUROXIME AXETIL) 1 by mouth two times a day  #20 x 0   Entered and Authorized by:   Loreen Freud DO   Signed by:   Loreen Freud DO on 08/17/2010   Method used:   Electronically to        [facility] #[id number]* (retail)       [address] [place], [place]  [address]       Ph: [phone]       Fax: [phone]   RxID:   [id number]    Orders Added: 1)  Est. Patient Level III [[address]]

## 2010-09-02 ENCOUNTER — Telehealth (INDEPENDENT_AMBULATORY_CARE_PROVIDER_SITE_OTHER): Payer: Self-pay | Admitting: *Deleted

## 2010-09-04 ENCOUNTER — Other Ambulatory Visit: Payer: Self-pay | Admitting: *Deleted

## 2010-09-04 MED ORDER — MOMETASONE FUROATE 50 MCG/ACT NA SUSP: 2.0000 | Freq: Every day | NASAL | Status: DC

## 2010-09-04 MED ORDER — FLUTICASONE FUROATE 27.5 MCG/SPRAY NA SUSP: 2.0000 | Freq: Every day | NASAL | Status: DC

## 2010-09-04 NOTE — Telephone Encounter (Signed)
Preferred medications fluticasone, flunisolide, nasonex, triamcinolone. Please advise on prior auth or med changed.Felecia Gian Ybarra CMA

## 2010-09-04 NOTE — Telephone Encounter (Signed)
Left message to call office to inform Pt that insurance will not pay for veramyst. Per dr Laury Axon ok to change to nasonex.Felecia Elnore Cosens CMA

## 2010-09-04 NOTE — Telephone Encounter (Signed)
Spoke with patient and I advised his Rx was denied because not covered under ins and advised he will need to try something else. Ok with trying Nasonex again.Marland KitchenMarland KitchenMarland KitchenFaxed to [facility] [phone]    KP

## 2010-09-10 NOTE — Progress Notes (Signed)
Summary: Veramyst refill  Phone Note Refill Request Message from:  Patient on September 02, 2010 8:12 AM  Refills Requested: Medication #1:  VERAMYST 27.5 MCG/SPRAY SUSP 2 sprays each nostril once daily.  Method Requested: Electronic Initial call taken by: Almeta Monas CMA Duncan Dull),  September 02, 2010 8:12 AM    Prescriptions: VERAMYST 27.5 MCG/SPRAY SUSP (FLUTICASONE FUROATE) 2 sprays each nostril once daily  #1 x 2   Entered by:   Almeta Monas CMA (AAMA)   Authorized by:   Loreen Freud DO   Signed by:   Almeta Monas CMA (AAMA) on 09/02/2010   Method used:   Faxed to ...       [facility] #[id number]* (retail)       [address] [place], [place]  [address]       Ph: [phone]       Fax: [phone]   RxID:   [id number]

## 2010-09-17 LAB — GLUCOSE, CAPILLARY: Glucose-Capillary: 165 mg/dL — ABNORMAL HIGH (ref 70–99)

## 2010-10-07 ENCOUNTER — Other Ambulatory Visit: Payer: Self-pay

## 2010-10-07 MED ORDER — METFORMIN HCL ER 500 MG PO TB24: 1000.0000 mg | ORAL_TABLET | Freq: Every evening | ORAL | Status: DC

## 2010-10-07 MED ORDER — ATORVASTATIN CALCIUM 40 MG PO TABS: 40.0000 mg | ORAL_TABLET | Freq: Every day | ORAL | Status: DC

## 2010-10-07 MED ORDER — LIRAGLUTIDE 18 MG/3ML ~~LOC~~ SOLN: 1.8000 mg | Freq: Every day | SUBCUTANEOUS | Status: DC

## 2010-10-15 ENCOUNTER — Other Ambulatory Visit (INDEPENDENT_AMBULATORY_CARE_PROVIDER_SITE_OTHER): Payer: BC Managed Care – PPO

## 2010-10-15 DIAGNOSIS — E785 Hyperlipidemia, unspecified: Secondary | ICD-10-CM

## 2010-10-15 LAB — BASIC METABOLIC PANEL
CO2: 26 mEq/L (ref 19–32)
Calcium: 9.2 mg/dL (ref 8.4–10.5)
Chloride: 102 mEq/L (ref 96–112)
Creatinine, Ser: 1 mg/dL (ref 0.4–1.5)
Glucose, Bld: 106 mg/dL — ABNORMAL HIGH (ref 70–99)

## 2010-11-10 ENCOUNTER — Other Ambulatory Visit: Payer: Self-pay

## 2010-11-10 MED ORDER — GLUCOSE BLOOD VI STRP: ORAL_STRIP | Status: DC

## 2010-11-10 MED ORDER — LIRAGLUTIDE 18 MG/3ML ~~LOC~~ SOLN: 1.8000 mg | Freq: Every day | SUBCUTANEOUS | Status: DC

## 2010-11-11 ENCOUNTER — Ambulatory Visit (INDEPENDENT_AMBULATORY_CARE_PROVIDER_SITE_OTHER): Payer: BC Managed Care – PPO | Admitting: Family Medicine

## 2010-11-11 ENCOUNTER — Encounter: Payer: Self-pay | Admitting: Family Medicine

## 2010-11-11 DIAGNOSIS — E119 Type 2 diabetes mellitus without complications: Secondary | ICD-10-CM

## 2010-11-11 DIAGNOSIS — E785 Hyperlipidemia, unspecified: Secondary | ICD-10-CM

## 2010-11-11 NOTE — Assessment & Plan Note (Signed)
con't meds con't diet and exercise

## 2010-11-11 NOTE — Assessment & Plan Note (Signed)
Controlled con't meds Recheck labs 3 months

## 2010-11-11 NOTE — Progress Notes (Signed)
  Subjective:    Patient ID: Richard [name], male    DOB: [birth year], 60 y.o.   MRN: [id number]  HPI Pt here review [facility].  No new complaints.    Review of Systems As above    Objective:   Physical Exam  Constitutional: He appears well-developed and well-nourished.  Psychiatric: He has a normal mood and affect. His behavior is normal.          Assessment & Plan:

## 2010-11-12 ENCOUNTER — Encounter: Payer: Self-pay | Admitting: Family Medicine

## 2010-12-06 ENCOUNTER — Other Ambulatory Visit: Payer: Self-pay | Admitting: Family Medicine

## 2010-12-11 ENCOUNTER — Ambulatory Visit (INDEPENDENT_AMBULATORY_CARE_PROVIDER_SITE_OTHER): Payer: BC Managed Care – PPO | Admitting: Family Medicine

## 2010-12-11 ENCOUNTER — Other Ambulatory Visit: Payer: Self-pay | Admitting: Family Medicine

## 2010-12-11 ENCOUNTER — Encounter: Payer: Self-pay | Admitting: Family Medicine

## 2010-12-11 VITALS — BP 140/96 | HR 83 | Temp 97.9°F | Wt 379.0 lb

## 2010-12-11 DIAGNOSIS — S91209A Unspecified open wound of unspecified toe(s) with damage to nail, initial encounter: Secondary | ICD-10-CM | POA: Insufficient documentation

## 2010-12-11 DIAGNOSIS — S91109A Unspecified open wound of unspecified toe(s) without damage to nail, initial encounter: Secondary | ICD-10-CM

## 2010-12-11 MED ORDER — HYDROCODONE-ACETAMINOPHEN 7.5-750 MG PO TABS: 30.0000 | ORAL_TABLET | Freq: Four times a day (QID) | ORAL | Status: DC | PRN

## 2010-12-11 MED ORDER — HYDROCODONE-ACETAMINOPHEN 7.5-750 MG PO TABS: 1.0000 | ORAL_TABLET | Freq: Four times a day (QID) | ORAL | Status: DC | PRN

## 2010-12-11 NOTE — Progress Notes (Signed)
  Subjective:    Patient ID: Richard [name], male    DOB: 28-Apr-1951, 60 y.o.   MRN: [id number]  HPI  Pt here c/o toenail hanging off after stubbing toe this am.  No other complaints.  Review of Systems    as above Objective:   Physical Exam  Constitutional: He appears well-developed and well-nourished.  Skin:         in place before rubber band cut off.  Post op shoe also given to pt.      Assessment & Plan:

## 2010-12-11 NOTE — Assessment & Plan Note (Signed)
Keep bandage on 24 hours then may remove Can clean with H2O2 if needed.  rto mon or tues to check toe

## 2010-12-11 NOTE — Patient Instructions (Signed)
Nail Avulsion Injury You have lost a fingernail or toenail. The nail will usually grow back normally in 1-3 months. If your injury damaged the growth center of the nail, the nail may be deformed, split, or not stuck to the nail bed. Sometimes the avulsed nail is stitched back in place. This provides temporary protection to the nail bed until the new nail grows in.  HOME CARE INSTRUCTIONS  Keep your injury elevated as much as possible.   Protect the injury covered with dressings or splints as recommended.   Change dressings as instructed.  SEEK MEDICAL CARE IF:  There is increasing pain, redness, or swelling.   You have can not move your fingers or toes.  Document Released: 07/08/2004 Document Re-Released: 05/19/2009 [facility] Patient Information 2011 [place], [place].

## 2010-12-14 ENCOUNTER — Ambulatory Visit: Payer: BC Managed Care – PPO | Admitting: Family Medicine

## 2010-12-14 ENCOUNTER — Encounter: Payer: Self-pay | Admitting: Family Medicine

## 2010-12-14 DIAGNOSIS — S91209A Unspecified open wound of unspecified toe(s) with damage to nail, initial encounter: Secondary | ICD-10-CM

## 2010-12-14 NOTE — Progress Notes (Signed)
  Subjective:    Patient ID: Richard [name], male    DOB: 27-Apr-1951, 60 y.o.   MRN: [id number]  HPI  Pt here to have [place] look at his toe.  No complaints.    Review of Systems As above.    Objective:   Physical Exam  Constitutional: He is oriented to person, place, and time. He appears well-developed and well-nourished.  Musculoskeletal:       L big toe--- still weeping Bandage put back in place  Neurological: He is alert and oriented to person, place, and time.          Assessment & Plan:

## 2010-12-14 NOTE — Assessment & Plan Note (Signed)
Wound check----  Keflex qid for 10 days rto 1 week

## 2010-12-17 ENCOUNTER — Encounter: Payer: Self-pay | Admitting: Family Medicine

## 2010-12-17 ENCOUNTER — Ambulatory Visit (INDEPENDENT_AMBULATORY_CARE_PROVIDER_SITE_OTHER): Payer: BC Managed Care – PPO | Admitting: Family Medicine

## 2010-12-17 VITALS — BP 130/70 | HR 85 | Temp 98.9°F | Wt 381.4 lb

## 2010-12-17 DIAGNOSIS — S91109A Unspecified open wound of unspecified toe(s) without damage to nail, initial encounter: Secondary | ICD-10-CM

## 2010-12-17 DIAGNOSIS — S91209A Unspecified open wound of unspecified toe(s) with damage to nail, initial encounter: Secondary | ICD-10-CM

## 2010-12-17 MED ORDER — CEPHALEXIN 500 MG PO CAPS: 500.0000 mg | ORAL_CAPSULE | Freq: Four times a day (QID) | ORAL | Status: AC

## 2010-12-17 NOTE — Assessment & Plan Note (Signed)
Finish abx Keep covered Will refer to podiatry if symptoms persist

## 2010-12-17 NOTE — Progress Notes (Signed)
  Subjective:    Patient ID: Richard [name], male    DOB: 22-Apr-1951, 60 y.o.   MRN: [id number]  HPI Pt here c/o toe starting to bleed Tuesday and he had difficulty getting it to stop --it did finally.  Pt also c/o heaviness in toe at night only.  Also Abx were not at pharmacy when he got there.    Review of Systems    as above Objective:   Physical Exam  Constitutional: He is oriented to person, place, and time. He appears well-developed and well-nourished.  Musculoskeletal:       L first toe---  No oozing today,  Still slightly red No swelling  Neurological: He is alert and oriented to person, place, and time.  Psychiatric: He has a normal mood and affect. His behavior is normal. Judgment and thought content normal.          Assessment & Plan:

## 2011-01-14 ENCOUNTER — Other Ambulatory Visit: Payer: BC Managed Care – PPO

## 2011-01-18 ENCOUNTER — Other Ambulatory Visit: Payer: Self-pay | Admitting: Family Medicine

## 2011-01-18 DIAGNOSIS — E785 Hyperlipidemia, unspecified: Secondary | ICD-10-CM

## 2011-01-18 DIAGNOSIS — E119 Type 2 diabetes mellitus without complications: Secondary | ICD-10-CM

## 2011-01-19 ENCOUNTER — Other Ambulatory Visit (INDEPENDENT_AMBULATORY_CARE_PROVIDER_SITE_OTHER): Payer: BC Managed Care – PPO

## 2011-01-19 DIAGNOSIS — E785 Hyperlipidemia, unspecified: Secondary | ICD-10-CM

## 2011-01-19 DIAGNOSIS — E119 Type 2 diabetes mellitus without complications: Secondary | ICD-10-CM

## 2011-01-19 LAB — BASIC METABOLIC PANEL
BUN: 14 mg/dL (ref 6–23)
Calcium: 8.9 mg/dL (ref 8.4–10.5)
Creatinine, Ser: 0.9 mg/dL (ref 0.4–1.5)
GFR: 115.91 mL/min (ref >60.00)
Glucose, Bld: 118 mg/dL — ABNORMAL HIGH (ref 70–99)

## 2011-01-19 LAB — HEPATIC FUNCTION PANEL
ALT: 16 U/L (ref 0–53)
AST: 18 U/L (ref 0–37)
Alkaline Phosphatase: 60 U/L (ref 39–117)
Bilirubin, Direct: 0 mg/dL (ref 0.0–0.3)
Total Bilirubin: 0.6 mg/dL (ref 0.3–1.2)

## 2011-01-19 LAB — LIPID PANEL: HDL: 48.7 mg/dL (ref >39.00)

## 2011-01-19 NOTE — Progress Notes (Signed)
Labs only

## 2011-01-25 ENCOUNTER — Telehealth: Payer: Self-pay | Admitting: *Deleted

## 2011-01-25 DIAGNOSIS — E785 Hyperlipidemia, unspecified: Secondary | ICD-10-CM

## 2011-01-25 NOTE — Telephone Encounter (Signed)
Pt aware of results. And orders in epic.

## 2011-01-28 ENCOUNTER — Telehealth: Payer: Self-pay | Admitting: *Deleted

## 2011-01-28 MED ORDER — METFORMIN HCL ER 750 MG PO TB24: ORAL_TABLET | ORAL | Status: DC

## 2011-01-28 NOTE — Telephone Encounter (Signed)
Discuss with patient rx sent, Letter Mail

## 2011-01-28 NOTE — Telephone Encounter (Signed)
Message copied by Verdene Rio on Thu Jan 28, 2011  4:04 PM ------      Message from: Lelon Perla      Created: Wed Jan 20, 2011  6:15 PM       DM not controlled------ should increase victoza to 1.8 mg -----if already doing that -----increase Glucophage xr 750 mg  2 po qd  #60,  2 refills      Cholesterol is great--con't all other meds      Repeat 3 months    250.00  272.4  Lipid, hep, bmp, hgba1c

## 2011-02-08 ENCOUNTER — Telehealth: Payer: Self-pay

## 2011-02-08 MED ORDER — LIRAGLUTIDE 18 MG/3ML ~~LOC~~ SOLN: 1.8000 mg | Freq: Every day | SUBCUTANEOUS | Status: DC

## 2011-02-08 MED ORDER — ATORVASTATIN CALCIUM 40 MG PO TABS: 40.0000 mg | ORAL_TABLET | Freq: Every day | ORAL | Status: DC

## 2011-02-08 NOTE — Telephone Encounter (Signed)
He can have the one ---tell him we have not been getting much and we can call in a rx

## 2011-02-08 NOTE — Telephone Encounter (Signed)
Patient called and requested a samples of Viagra. Only 1 sample left...Marland KitchenMarland KitchenPlease advise      KP

## 2011-02-08 NOTE — Telephone Encounter (Signed)
Refill called in and sample given to patient     KP

## 2011-04-30 ENCOUNTER — Other Ambulatory Visit: Payer: Self-pay

## 2011-04-30 MED ORDER — METFORMIN HCL ER 750 MG PO TB24: ORAL_TABLET | ORAL | Status: DC

## 2011-04-30 NOTE — Telephone Encounter (Signed)
Refill request----Rx in the system             KP

## 2011-05-10 ENCOUNTER — Other Ambulatory Visit: Payer: Self-pay | Admitting: Family Medicine

## 2011-05-10 DIAGNOSIS — E785 Hyperlipidemia, unspecified: Secondary | ICD-10-CM

## 2011-05-10 DIAGNOSIS — E119 Type 2 diabetes mellitus without complications: Secondary | ICD-10-CM

## 2011-05-11 ENCOUNTER — Other Ambulatory Visit (INDEPENDENT_AMBULATORY_CARE_PROVIDER_SITE_OTHER): Payer: BC Managed Care – PPO

## 2011-05-11 ENCOUNTER — Other Ambulatory Visit: Payer: Self-pay

## 2011-05-11 DIAGNOSIS — E785 Hyperlipidemia, unspecified: Secondary | ICD-10-CM

## 2011-05-11 DIAGNOSIS — E119 Type 2 diabetes mellitus without complications: Secondary | ICD-10-CM

## 2011-05-11 LAB — BASIC METABOLIC PANEL
Chloride: 104 mEq/L (ref 96–112)
Potassium: 3.9 mEq/L (ref 3.5–5.1)
Sodium: 138 mEq/L (ref 135–145)

## 2011-05-11 LAB — LIPID PANEL
HDL: 42.7 mg/dL (ref >39.00)
LDL Cholesterol: 59 mg/dL (ref 0–99)
Total CHOL/HDL Ratio: 3
Triglycerides: 99 mg/dL (ref 0.0–149.0)

## 2011-05-11 LAB — HEPATIC FUNCTION PANEL
Albumin: 3.7 g/dL (ref 3.5–5.2)
Bilirubin, Direct: 0 mg/dL (ref 0.0–0.3)
Total Protein: 7.5 g/dL (ref 6.0–8.3)

## 2011-05-11 MED ORDER — SILDENAFIL CITRATE 100 MG PO TABS: 100.0000 mg | ORAL_TABLET | ORAL | Status: DC | PRN

## 2011-05-11 NOTE — Telephone Encounter (Signed)
Patient requested Viagra 100 mg Hard script so he can take it to the pharmacy. Please advise    KP

## 2011-05-11 NOTE — Telephone Encounter (Signed)
Ok to refill 

## 2011-05-11 NOTE — Telephone Encounter (Signed)
Patient aware Rx ready for pick up.      KP 

## 2011-05-11 NOTE — Progress Notes (Signed)
12  

## 2011-05-24 ENCOUNTER — Other Ambulatory Visit: Payer: Self-pay

## 2011-05-24 MED ORDER — INSULIN PEN NEEDLE 32G X 6 MM MISC: 1.0000 "pen " | Freq: Every day | Status: DC

## 2011-05-24 MED ORDER — ATORVASTATIN CALCIUM 40 MG PO TABS: 40.0000 mg | ORAL_TABLET | Freq: Every day | ORAL | Status: DC

## 2011-05-24 MED ORDER — LIRAGLUTIDE 18 MG/3ML ~~LOC~~ SOLN: 1.8000 mg | Freq: Every day | SUBCUTANEOUS | Status: DC

## 2011-05-24 MED ORDER — METFORMIN HCL ER 750 MG PO TB24: ORAL_TABLET | ORAL | Status: DC

## 2011-05-24 MED ORDER — GLUCOSE BLOOD VI STRP: ORAL_STRIP | Status: AC

## 2011-06-27 ENCOUNTER — Other Ambulatory Visit: Payer: Self-pay | Admitting: Family Medicine

## 2011-06-28 ENCOUNTER — Other Ambulatory Visit: Payer: Self-pay

## 2011-06-28 MED ORDER — LIRAGLUTIDE 18 MG/3ML ~~LOC~~ SOLN: 1.8000 mg | Freq: Every day | SUBCUTANEOUS | Status: DC

## 2011-06-28 NOTE — Telephone Encounter (Signed)
RX filled on 05/24/11 with 5 refills.  Verified with Megan at pharmacy.  RX denied.

## 2011-08-10 ENCOUNTER — Encounter: Payer: Self-pay | Admitting: Family Medicine

## 2011-08-10 ENCOUNTER — Ambulatory Visit (INDEPENDENT_AMBULATORY_CARE_PROVIDER_SITE_OTHER): Payer: BC Managed Care – PPO | Admitting: Family Medicine

## 2011-08-10 VITALS — BP 132/74 | HR 76 | Temp 98.4°F | Wt 375.0 lb

## 2011-08-10 DIAGNOSIS — J329 Chronic sinusitis, unspecified: Secondary | ICD-10-CM

## 2011-08-10 MED ORDER — GUAIFENESIN-CODEINE 100-10 MG/5ML PO SYRP: ORAL_SOLUTION | ORAL | Status: DC

## 2011-08-10 MED ORDER — CLOTRIMAZOLE-BETAMETHASONE 1-0.05 % EX CREA: TOPICAL_CREAM | Freq: Two times a day (BID) | CUTANEOUS | Status: DC

## 2011-08-10 MED ORDER — CEFUROXIME AXETIL 500 MG PO TABS: 500.0000 mg | ORAL_TABLET | Freq: Two times a day (BID) | ORAL | Status: AC

## 2011-08-10 NOTE — Progress Notes (Signed)
  Subjective:     Richard [name] is a 61 y.o. male who presents for evaluation of sinus pain. Symptoms include: congestion, cough, nasal congestion, sinus pressure and sore throat. Onset of symptoms was 4 days ago. Symptoms have been gradually worsening since that time. Past history is significant for no history of pneumonia or bronchitis. Patient is a non-smoker.  The following portions of the patient's history were reviewed and updated as appropriate: allergies, current medications, past family history, past medical history, past social history, past surgical history and problem list.  Review of Systems Pertinent items are noted in HPI.   Objective:    BP 132/74  Pulse 76  Temp(Src) 98.4 F (36.9 C) (Oral)  Wt 375 lb (170.099 kg)  SpO2 97% General appearance: alert, cooperative, appears stated age and no distress Ears: normal TM's and external ear canals both ears Nose: green discharge, mild congestion, sinus tenderness bilateral Throat: abnormal findings: mild oropharyngeal erythema and PND Neck: mild anterior cervical adenopathy and thyroid not enlarged, symmetric, no tenderness/mass/nodules Lungs: clear to auscultation bilaterally Heart: S1, S2 normal Extremities: extremities normal, atraumatic, no cyanosis or edema Lymph nodes: Cervical adenopathy: b/l    Assessment:    Acute bacterial sinusitis.    Plan:    Nasal steroids per medication orders. Ceftin per medication orders. f/u prn

## 2011-08-10 NOTE — Patient Instructions (Signed)

## 2011-12-02 ENCOUNTER — Other Ambulatory Visit: Payer: Self-pay

## 2011-12-02 MED ORDER — LIRAGLUTIDE 18 MG/3ML ~~LOC~~ SOLN: 1.8000 mg | Freq: Every day | SUBCUTANEOUS | Status: DC

## 2011-12-03 ENCOUNTER — Other Ambulatory Visit (INDEPENDENT_AMBULATORY_CARE_PROVIDER_SITE_OTHER): Payer: BC Managed Care – PPO

## 2011-12-03 DIAGNOSIS — IMO0002 Reserved for concepts with insufficient information to code with codable children: Secondary | ICD-10-CM

## 2011-12-03 DIAGNOSIS — E785 Hyperlipidemia, unspecified: Secondary | ICD-10-CM

## 2011-12-03 LAB — LIPID PANEL
Cholesterol: 111 mg/dL (ref 0–200)
LDL Cholesterol: 51 mg/dL (ref 0–99)
Triglycerides: 91 mg/dL (ref 0.0–149.0)
VLDL: 18.2 mg/dL (ref 0.0–40.0)

## 2011-12-03 LAB — HEPATIC FUNCTION PANEL
ALT: 15 U/L (ref 0–53)
Albumin: 3.7 g/dL (ref 3.5–5.2)
Alkaline Phosphatase: 64 U/L (ref 39–117)
Bilirubin, Direct: 0 mg/dL (ref 0.0–0.3)
Total Protein: 7.2 g/dL (ref 6.0–8.3)

## 2011-12-03 LAB — HEMOGLOBIN A1C: Hgb A1c MFr Bld: 6.8 % — ABNORMAL HIGH (ref 4.6–6.5)

## 2011-12-03 LAB — BASIC METABOLIC PANEL
BUN: 13 mg/dL (ref 6–23)
Chloride: 104 mEq/L (ref 96–112)
Creatinine, Ser: 1.1 mg/dL (ref 0.4–1.5)
GFR: 91.01 mL/min (ref >60.00)

## 2011-12-03 LAB — MICROALBUMIN / CREATININE URINE RATIO: Microalb, Ur: 0.8 mg/dL (ref 0.0–1.9)

## 2011-12-06 MED ORDER — ATORVASTATIN CALCIUM 40 MG PO TABS: 40.0000 mg | ORAL_TABLET | Freq: Every day | ORAL | Status: DC

## 2011-12-06 MED ORDER — METFORMIN HCL ER 750 MG PO TB24: ORAL_TABLET | ORAL | Status: DC

## 2011-12-24 ENCOUNTER — Other Ambulatory Visit: Payer: Self-pay

## 2011-12-24 MED ORDER — SILDENAFIL CITRATE 100 MG PO TABS: 100.0000 mg | ORAL_TABLET | ORAL | Status: DC | PRN

## 2011-12-24 MED ORDER — METFORMIN HCL ER 750 MG PO TB24: ORAL_TABLET | ORAL | Status: DC

## 2011-12-24 MED ORDER — ATORVASTATIN CALCIUM 40 MG PO TABS: 40.0000 mg | ORAL_TABLET | Freq: Every day | ORAL | Status: DC

## 2011-12-24 NOTE — Telephone Encounter (Signed)
Patient is aware to pick up the Rx today.   KP

## 2011-12-24 NOTE — Telephone Encounter (Signed)
patient would like an RX for Viagra with refills. Please advise     KP

## 2011-12-24 NOTE — Telephone Encounter (Signed)
Message copied by Arnette Norris on Fri Dec 24, 2011  8:41 AM ------      Message from: Lelon Perla      Created: Fri Dec 24, 2011  8:29 AM       Ok to put 3 refills on viagra

## 2012-01-27 ENCOUNTER — Ambulatory Visit (INDEPENDENT_AMBULATORY_CARE_PROVIDER_SITE_OTHER): Payer: BC Managed Care – PPO | Admitting: Family Medicine

## 2012-01-27 ENCOUNTER — Encounter: Payer: Self-pay | Admitting: Family Medicine

## 2012-01-27 VITALS — BP 120/72 | HR 88 | Temp 98.3°F | Wt 375.0 lb

## 2012-01-27 DIAGNOSIS — J329 Chronic sinusitis, unspecified: Secondary | ICD-10-CM

## 2012-01-27 DIAGNOSIS — J019 Acute sinusitis, unspecified: Secondary | ICD-10-CM

## 2012-01-27 DIAGNOSIS — L853 Xerosis cutis: Secondary | ICD-10-CM

## 2012-01-27 DIAGNOSIS — J40 Bronchitis, not specified as acute or chronic: Secondary | ICD-10-CM

## 2012-01-27 MED ORDER — AMMONIUM LACTATE 12 % EX CREA: TOPICAL_CREAM | CUTANEOUS | Status: AC | PRN

## 2012-01-27 MED ORDER — GUAIFENESIN-CODEINE 100-10 MG/5ML PO SYRP
ORAL_SOLUTION | ORAL | Status: DC
Start: 2012-01-27 — End: 2013-02-06

## 2012-01-27 MED ORDER — AMOXICILLIN-POT CLAVULANATE 875-125 MG PO TABS
1.0000 | ORAL_TABLET | Freq: Two times a day (BID) | ORAL | Status: AC
Start: 2012-01-27 — End: 2012-02-06

## 2012-01-27 MED ORDER — PREDNISONE 10 MG PO TABS: ORAL_TABLET | ORAL | Status: DC

## 2012-01-27 NOTE — Patient Instructions (Signed)
Sinusitis  Sinuses are air pockets within the bones of your face. The growth of bacteria within a sinus leads to infection. The infection prevents the sinuses from draining. This infection is called sinusitis.  SYMPTOMS   There will be different areas of pain depending on which sinuses have become infected.   The maxillary sinuses often produce pain beneath the eyes.   Frontal sinusitis may cause pain in the middle of the forehead and above the eyes.  Other problems (symptoms) include:   Toothaches.   Colored, pus-like (purulent) drainage from the nose.   Swelling, warmth, and tenderness over the sinus areas may be signs of infection.  TREATMENT   Sinusitis is most often determined by an exam.X-rays may be taken. If x-rays have been taken, make sure you obtain your results or find out how you are to obtain them. Your caregiver may give you medications (antibiotics). These are medications that will help kill the bacteria causing the infection. You may also be given a medication (decongestant) that helps to reduce sinus swelling.   HOME CARE INSTRUCTIONS    Only take over-the-counter or prescription medicines for pain, discomfort, or fever as directed by your caregiver.   Drink extra fluids. Fluids help thin the mucus so your sinuses can drain more easily.   Applying either moist heat or ice packs to the sinus areas may help relieve discomfort.   Use saline nasal sprays to help moisten your sinuses. The sprays can be found at your local drugstore.  SEEK IMMEDIATE MEDICAL CARE IF:   You have a fever.   You have increasing pain, severe headaches, or toothache.   You have nausea, vomiting, or drowsiness.   You develop unusual swelling around the face or trouble seeing.  MAKE SURE YOU:    Understand these instructions.   Will watch your condition.   Will get help right away if you are not doing well or get worse.  Document Released: 05/31/2005 Document Revised: 05/20/2011 Document Reviewed:  12/28/2006  ExitCare Patient Information 2012 [employer]    Bronchitis  Bronchitis is the body's way of reacting to injury and/or infection (inflammation) of the bronchi. Bronchi are the air tubes that extend from the windpipe into the lungs. If the inflammation becomes severe, it may cause shortness of breath.  CAUSES   Inflammation may be caused by:   A virus.   Germs (bacteria).   Dust.   Allergens.   Pollutants and many other irritants.  The cells lining the bronchial tree are covered with tiny hairs (cilia). These constantly beat upward, away from the lungs, toward the mouth. This keeps the lungs free of pollutants. When these cells become too irritated and are unable to do their job, mucus begins to develop. This causes the characteristic cough of bronchitis. The cough clears the lungs when the cilia are unable to do their job. Without either of these protective mechanisms, the mucus would settle in the lungs. Then you would develop pneumonia.  Smoking is a common cause of bronchitis and can contribute to pneumonia. Stopping this habit is the single most important thing you can do to help yourself.  TREATMENT    Your caregiver may prescribe an antibiotic if the cough is caused by bacteria. Also, medicines that open up your airways make it easier to breathe. Your caregiver may also recommend or prescribe an expectorant. It will loosen the mucus to be coughed up. Only take over-the-counter or prescription medicines for pain, discomfort, or fever as directed   by your caregiver.   Removing whatever causes the problem (smoking, for example) is critical to preventing the problem from getting worse.   Cough suppressants may be prescribed for relief of cough symptoms.   Inhaled medicines may be prescribed to help with symptoms now and to help prevent problems from returning.   For those with recurrent (chronic) bronchitis, there may be a need for steroid medicines.  SEEK IMMEDIATE MEDICAL CARE IF:     During treatment, you develop more pus-like mucus (purulent sputum).   You have a fever.   Your baby is older than 3 months with a rectal temperature of 102 F (38.9 C) or higher.   Your baby is 3 months old or younger with a rectal temperature of 100.4 F (38 C) or higher.   You become progressively more ill.   You have increased difficulty breathing, wheezing, or shortness of breath.  It is necessary to seek immediate medical care if you are elderly or sick from any other disease.  MAKE SURE YOU:    Understand these instructions.   Will watch your condition.   Will get help right away if you are not doing well or get worse.  Document Released: 05/31/2005 Document Revised: 05/20/2011 Document Reviewed: 04/09/2008  [employer] Patient Information 2012 [employer]

## 2012-01-27 NOTE — Progress Notes (Signed)
  Subjective:     Richard [name] is a 61 y.o. male who presents for evaluation of sinus pain. Symptoms include: congestion, fevers, nasal congestion and sinus pressure and prod cough. Onset of symptoms was 6 days ago. Symptoms have been gradually worsening since that time. Past history is significant for no history of pneumonia or bronchitis. Patient is a non-smoker. Took cheratussin and mucinex with some relief.    The following portions of the patient's history were reviewed and updated as appropriate: allergies, current medications, past family history, past medical history, past social history, past surgical history and problem list.  Review of Systems Pertinent items are noted in HPI.   Objective:    BP 120/72  Pulse 88  Temp 98.3 F (36.8 C) (Oral)  Wt 375 lb (170.099 kg)  SpO2 95% General appearance: alert, cooperative, appears stated age and no distress Ears: normal TM's and external ear canals both ears Nose: green discharge, moderate congestion, turbinates red, swollen, sinus tenderness bilateral Throat: lips, mucosa, and tongue normal; teeth and gums normal Neck: mild anterior cervical adenopathy, supple, symmetrical, trachea midline and thyroid not enlarged, symmetric, no tenderness/mass/nodules Lungs: diminished breath sounds bilaterally Heart: S1, S2 normal    Assessment:    Acute bacterial sinusitis.    Plan:    Nasal steroids per medication orders. Augmentin per medication orders. pred taper--- reg insulin sliding scale given to be used prn  cheratussin prn

## 2012-02-24 ENCOUNTER — Ambulatory Visit (INDEPENDENT_AMBULATORY_CARE_PROVIDER_SITE_OTHER): Payer: BC Managed Care – PPO | Admitting: Family Medicine

## 2012-02-24 ENCOUNTER — Encounter: Payer: Self-pay | Admitting: Family Medicine

## 2012-02-24 VITALS — BP 126/92 | HR 103 | Temp 98.4°F | Wt 371.4 lb

## 2012-02-24 DIAGNOSIS — S139XXA Sprain of joints and ligaments of unspecified parts of neck, initial encounter: Secondary | ICD-10-CM

## 2012-02-24 DIAGNOSIS — S161XXA Strain of muscle, fascia and tendon at neck level, initial encounter: Secondary | ICD-10-CM

## 2012-02-24 MED ORDER — NONFORMULARY OR COMPOUNDED ITEM: Status: DC

## 2012-02-24 NOTE — Patient Instructions (Addendum)
Cervical Sprain  A cervical sprain is when the ligaments in the neck stretch or tear. The ligaments are the tissues that hold the neck bones in place.  HOME CARE    Put ice on the injured area.   Put ice in a plastic bag.   Place a towel between your skin and the bag.   Leave the ice on for 15 to 20 minutes, 3 to 4 times a day.   Only take medicine as told by your doctor.   Keep all doctor visits as told.   Keep all physical therapy visits as told.   If your doctor gives you a neck collar, wear it as told.   Do not drive while wearing a neck collar.   Adjust your work station so that you have good posture while you work.   Avoid positions and activities that make your problems worse.   Warm up and stretch before being active.  GET HELP RIGHT AWAY IF:    You are bleeding or your stomach is upset.   You have an allergic reaction to your medicine.   Your problems (symptoms) get worse.   You develop new problems.   You lose feeling (numbness) or you cannot move (paralysis) any part of your body.   You have tingling or weakness in any part of your body.   Your pain is not controlled with medicine.   You cannot take less pain medicine over time as planned.   Your activity level does not improve as expected.  MAKE SURE YOU:    Understand these instructions.   Will watch your condition.   Will get help right away if you are not doing well or get worse.  Document Released: 11/17/2007 Document Revised: 05/20/2011 Document Reviewed: 03/04/2011  [employer] Patient Information 2012 [employer]

## 2012-02-25 ENCOUNTER — Other Ambulatory Visit: Payer: Self-pay

## 2012-02-25 MED ORDER — CYCLOBENZAPRINE HCL 10 MG PO TABS: 10.0000 mg | ORAL_TABLET | Freq: Three times a day (TID) | ORAL | Status: AC | PRN

## 2012-02-25 NOTE — Telephone Encounter (Signed)
Call from patient and the flexeril did not make it to the pharmacy. Rx refaxed       KP

## 2012-02-27 ENCOUNTER — Encounter: Payer: Self-pay | Admitting: Family Medicine

## 2012-02-27 NOTE — Progress Notes (Signed)
  Subjective:     Richard [name] is a 61 y.o. male who presents for evaluation of neck pain. Event that precipitated these symptoms: not sure. Onset of sy ago, and have been unchanged since that time.  . Patient denies numbness tingling, cp, sob.. Patient has had no prior problems.. Previous treatments: none   Review of Systems as above    Objective:    BP 126/92  Pulse 103  Temp 98.4 F (36.9 C) (Oral)  Wt 371 lb 6.4 oz (168.466 kg)  SpO2 97% General:   AAOx3  NAD  External Deformity:  absent  ROM Cervical Spine:   full ROM  Midline Tenderness:  none  Paraspinous tenderness:  R side  UE Neurologic Exam:  normal   X-ray of the cervical spine:not donel    Assessment:     cervical strain/sprain  Plan:     warm compresses Muscle relaxer Chiropractor rto prn

## 2012-03-15 IMAGING — US US RENAL
1 series · 14 of 25 positions shown · non-contrast
Comparison: None.

CLINICAL DATA: Diabetes, family history of solitary kidney into
brothers

RENAL/URINARY TRACT ULTRASOUND COMPLETE

[Series 1: us renal · 0.35mm/px · 14 of 27 slices shown]
[im 1/27]
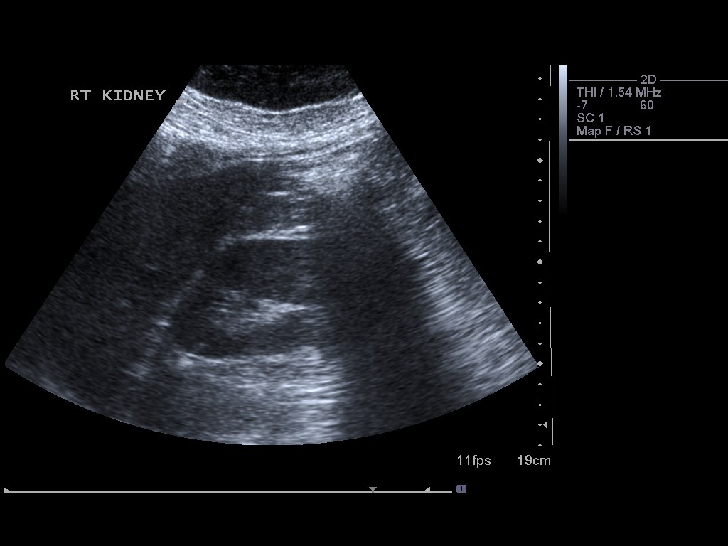
[im 3/27]
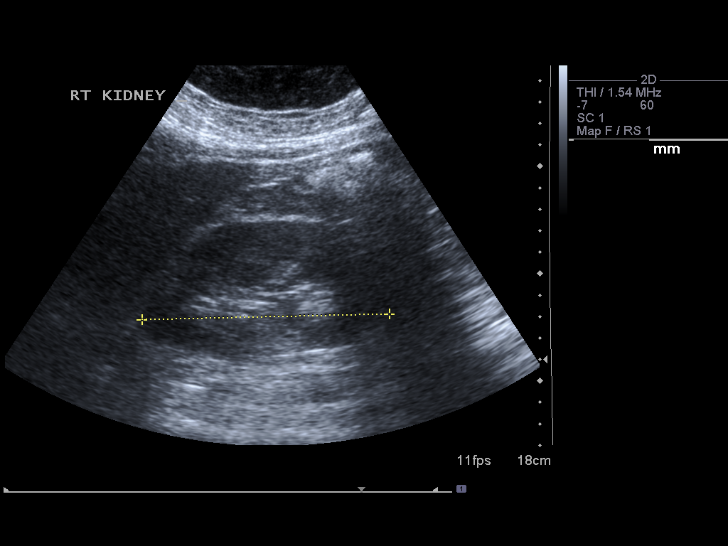
[im 5/27]
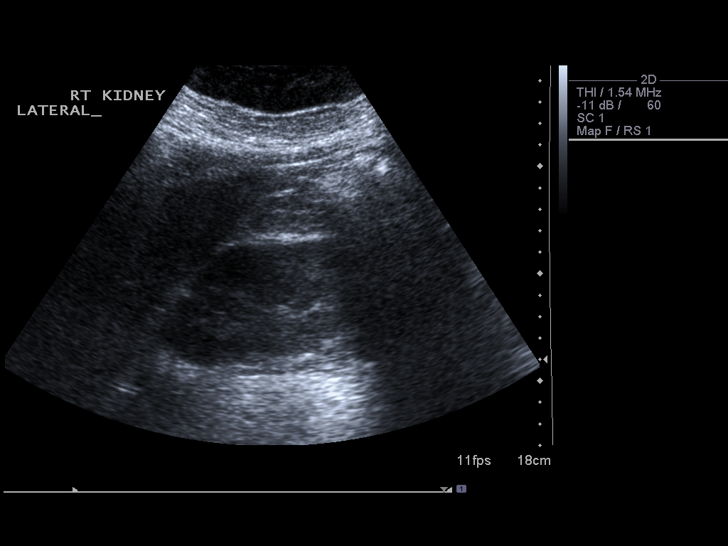
[im 7/27]
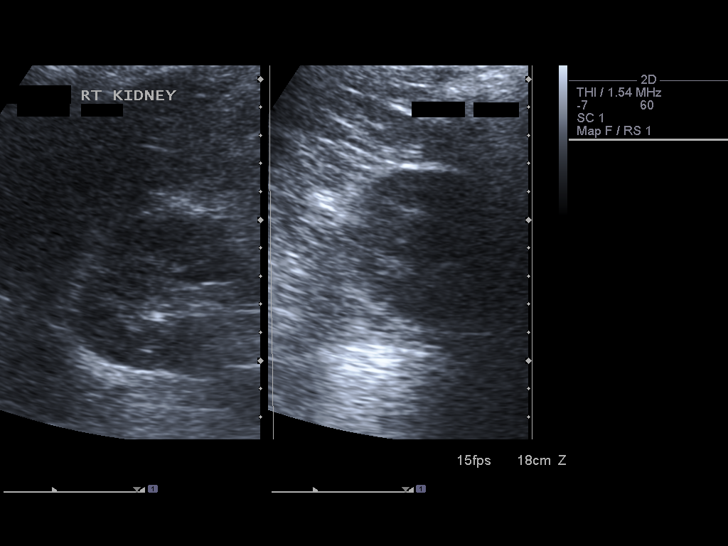
[im 9/27]
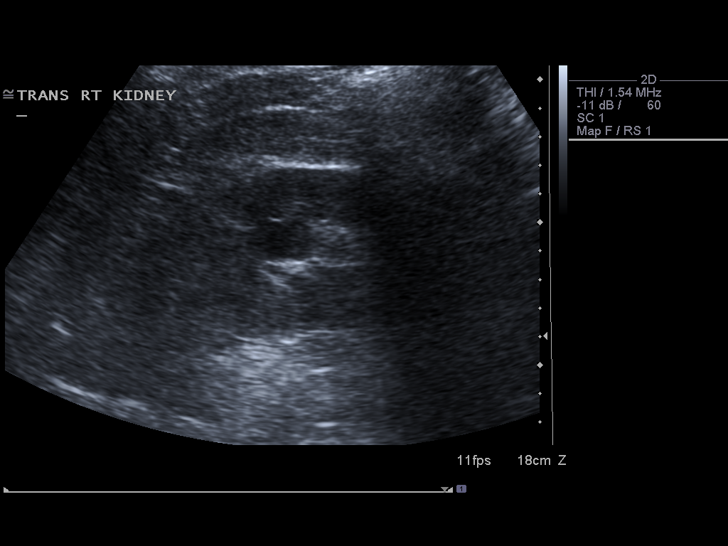
[im 10/27]
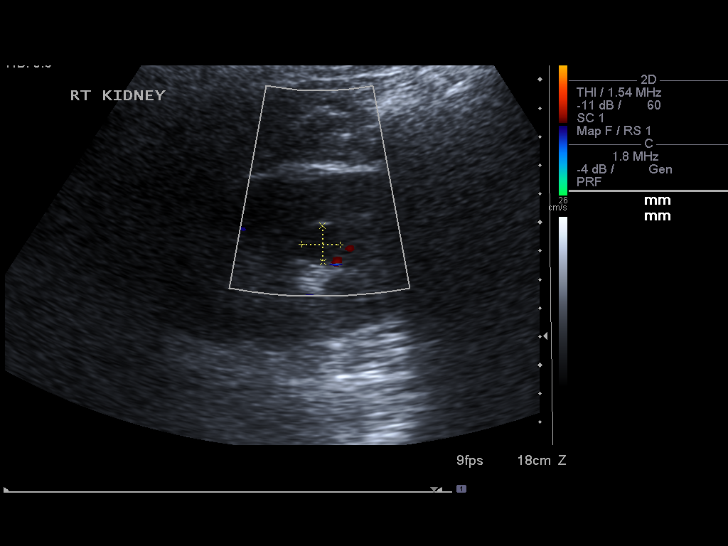
[im 12/27]
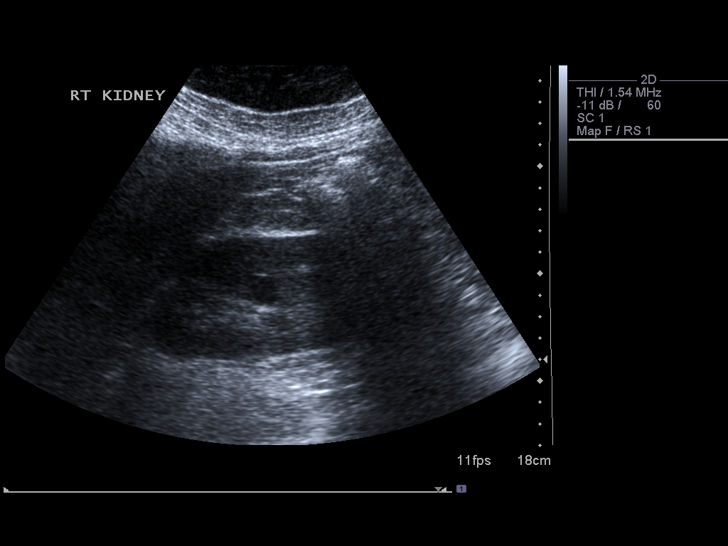
[im 15/27]
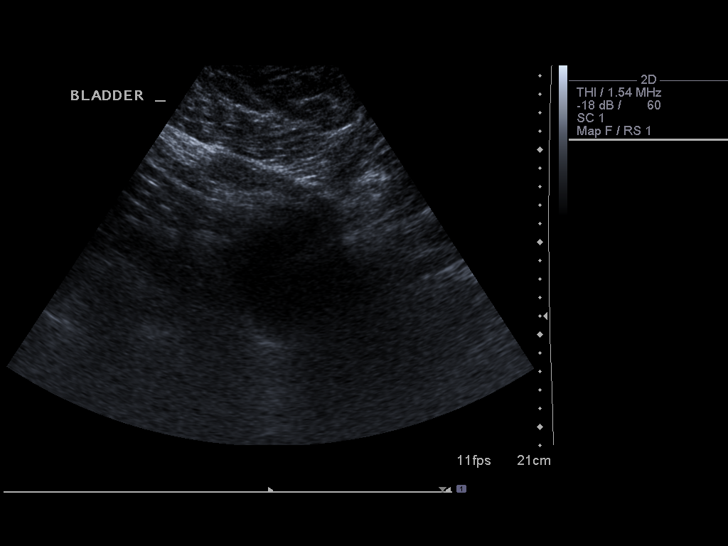
[im 17/27]
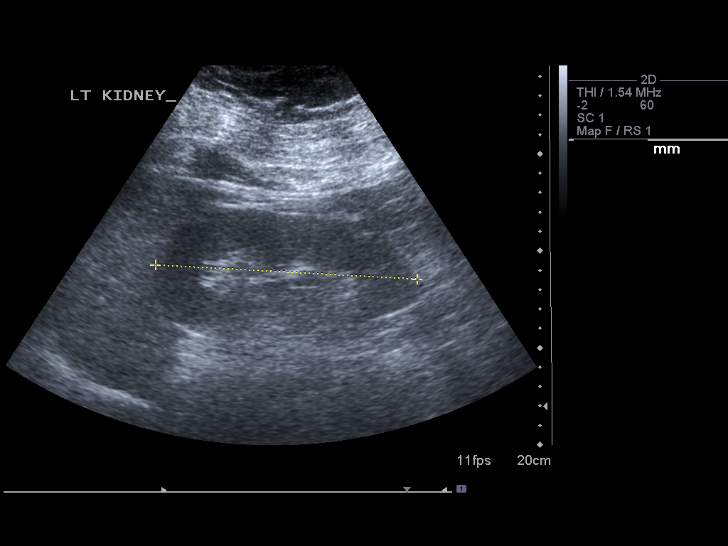
[im 18/27]
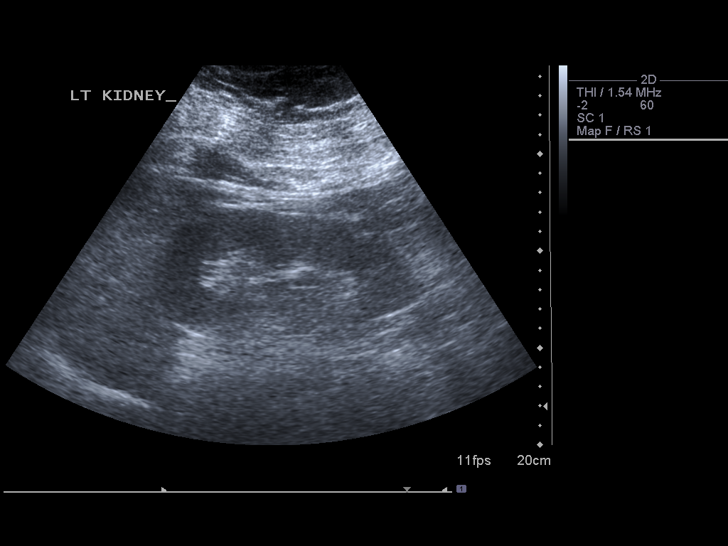
[im 20/27]
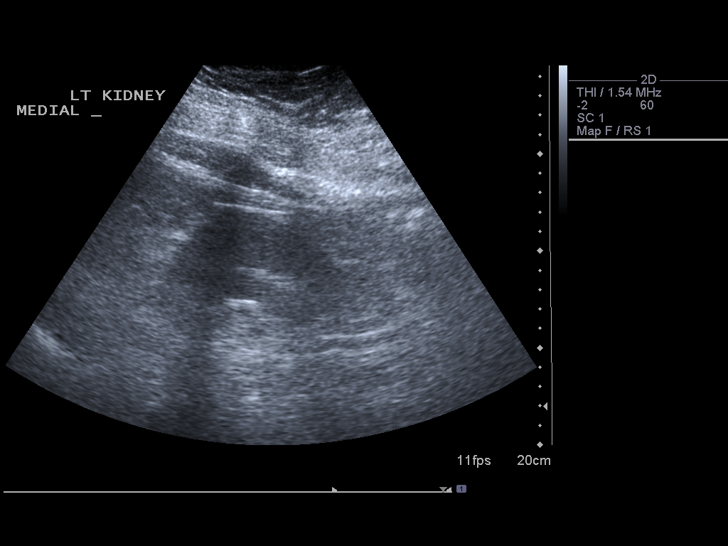
[im 22/27]
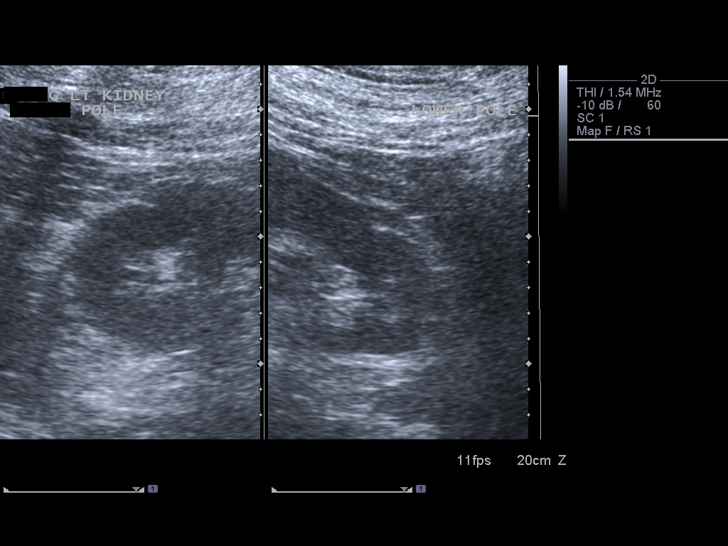
[im 24/27]
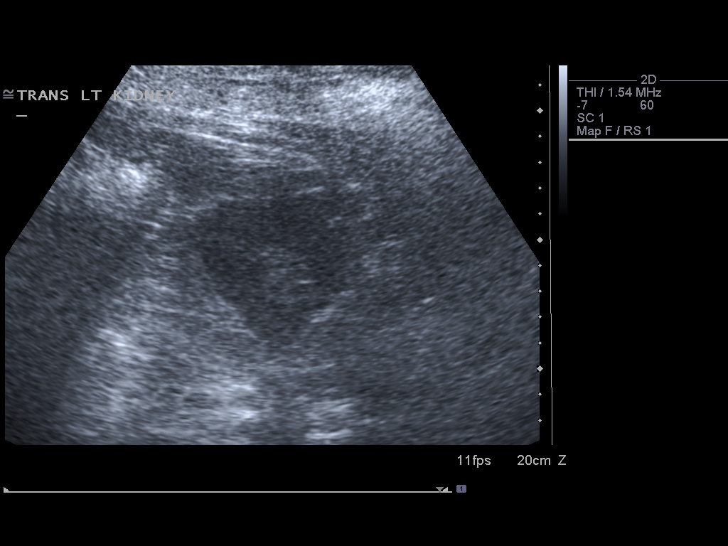
[im 27/27]
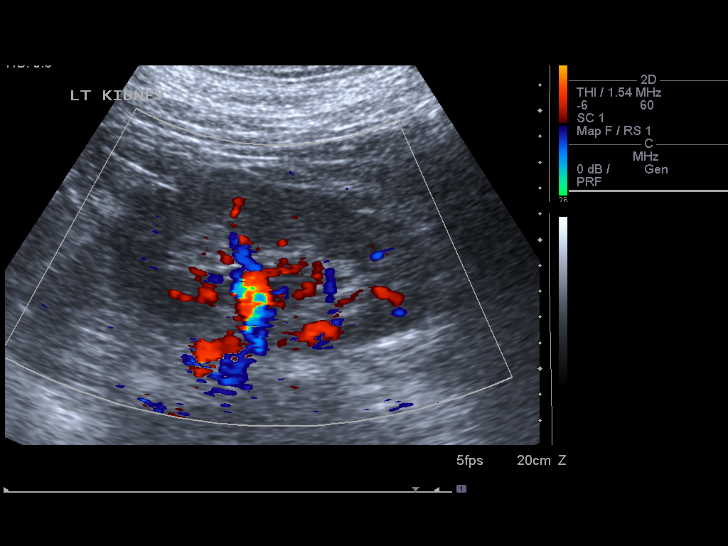

[14 of 25 positions shown; findings below may reference images not displayed]

FINDINGS: Right Kidney:  No hydronephrosis is seen.  The right kidney
measures 11.5 cm sagittally.  There does appear to be a parapelvic
cyst of 6 x 13 x 13 mm.  Assessment is somewhat limited due to body
habitus.

Left Kidney:  No hydronephrosis.  The left kidney measures 13.5 cm.
Evaluation is again limited by body habitus.

Bladder:  The urinary bladder is not well seen and cannot be
assessed.
IMPRESSION: 1.  No hydronephrosis.
2.  Two kidneys are present with the left being slightly larger
than the right, both of which are partially obscured by body
habitus.

## 2012-06-12 ENCOUNTER — Encounter: Payer: Self-pay | Admitting: Family Medicine

## 2012-06-12 ENCOUNTER — Ambulatory Visit (INDEPENDENT_AMBULATORY_CARE_PROVIDER_SITE_OTHER): Payer: BC Managed Care – PPO | Admitting: Family Medicine

## 2012-06-12 VITALS — BP 116/60 | HR 97 | Temp 98.8°F | Wt 377.8 lb

## 2012-06-12 DIAGNOSIS — E119 Type 2 diabetes mellitus without complications: Secondary | ICD-10-CM

## 2012-06-12 DIAGNOSIS — J40 Bronchitis, not specified as acute or chronic: Secondary | ICD-10-CM

## 2012-06-12 DIAGNOSIS — J329 Chronic sinusitis, unspecified: Secondary | ICD-10-CM

## 2012-06-12 MED ORDER — GLUCOSE BLOOD VI STRP: ORAL_STRIP | Status: DC

## 2012-06-12 MED ORDER — AMOXICILLIN-POT CLAVULANATE 875-125 MG PO TABS: 1.0000 | ORAL_TABLET | Freq: Two times a day (BID) | ORAL | Status: DC

## 2012-06-12 MED ORDER — PREDNISONE 10 MG PO TABS: ORAL_TABLET | ORAL | Status: DC

## 2012-06-12 MED ORDER — GUAIFENESIN-CODEINE 100-10 MG/5ML PO SYRP: ORAL_SOLUTION | ORAL | Status: DC

## 2012-06-12 NOTE — Progress Notes (Signed)
  Subjective:     Richard [name] is a 61 y.o. male who presents for evaluation of sinus pain. Symptoms include: congestion, facial pain, nasal congestion and sinus pressure. Onset of symptoms was 1 week ago. Symptoms have been gradually worsening since that time. Past history is significant for no history of pneumonia or bronchitis. Patient is a non-smoker.  The following portions of the patient's history were reviewed and updated as appropriate: allergies, current medications, past family history, past medical history, past social history, past surgical history and problem list.  Review of Systems Pertinent items are noted in HPI.   Objective:    BP 116/60  Pulse 97  Temp 98.8 F (37.1 C) (Oral)  Wt 377 lb 12.8 oz (171.369 kg)  SpO2 96% General appearance: alert, cooperative, appears stated age and no distress Ears: normal TM's and external ear canals both ears Nose: green discharge, moderate congestion, turbinates red, swollen, sinus tenderness bilateral Throat: abnormal findings: pnd  Neck: mild anterior cervical adenopathy, supple, symmetrical, trachea midline and thyroid not enlarged, symmetric, no tenderness/mass/nodules Lungs: clear to auscultation bilaterally Heart: S1, S2 normal    Assessment:    Acute bacterial sinusitis.    Plan:    Nasal steroids per medication orders. Antihistamines per medication orders. Augmentin per medication orders.  pred taper Cough meds rto prn Pt has insulin pen and sliding scale if needed

## 2012-06-12 NOTE — Patient Instructions (Addendum)

## 2012-06-29 ENCOUNTER — Other Ambulatory Visit: Payer: Self-pay

## 2012-06-29 MED ORDER — LIRAGLUTIDE 18 MG/3ML ~~LOC~~ SOLN: 1.8000 mg | Freq: Every day | SUBCUTANEOUS | Status: DC

## 2012-06-30 ENCOUNTER — Other Ambulatory Visit (INDEPENDENT_AMBULATORY_CARE_PROVIDER_SITE_OTHER): Payer: BC Managed Care – PPO

## 2012-06-30 DIAGNOSIS — E785 Hyperlipidemia, unspecified: Secondary | ICD-10-CM

## 2012-06-30 DIAGNOSIS — E119 Type 2 diabetes mellitus without complications: Secondary | ICD-10-CM

## 2012-06-30 LAB — HEPATIC FUNCTION PANEL
ALT: 19 U/L (ref 0–53)
Bilirubin, Direct: 0 mg/dL (ref 0.0–0.3)
Total Bilirubin: 0.7 mg/dL (ref 0.3–1.2)

## 2012-06-30 LAB — LIPID PANEL
LDL Cholesterol: 123 mg/dL — ABNORMAL HIGH (ref 0–99)
Total CHOL/HDL Ratio: 5
VLDL: 23.4 mg/dL (ref 0.0–40.0)

## 2012-06-30 LAB — HEMOGLOBIN A1C: Hgb A1c MFr Bld: 7.1 % — ABNORMAL HIGH (ref 4.6–6.5)

## 2012-06-30 LAB — MICROALBUMIN / CREATININE URINE RATIO: Microalb Creat Ratio: 0.4 mg/g (ref 0.0–30.0)

## 2012-07-20 MED ORDER — METFORMIN HCL ER 750 MG PO TB24: ORAL_TABLET | ORAL | Status: DC

## 2012-08-30 ENCOUNTER — Other Ambulatory Visit: Payer: Self-pay | Admitting: *Deleted

## 2012-08-30 MED ORDER — CLOTRIMAZOLE-BETAMETHASONE 1-0.05 % EX CREA: TOPICAL_CREAM | Freq: Two times a day (BID) | CUTANEOUS | Status: DC

## 2012-08-30 MED ORDER — INSULIN PEN NEEDLE 32G X 6 MM MISC: Status: DC

## 2012-08-30 MED ORDER — METFORMIN HCL ER 750 MG PO TB24: ORAL_TABLET | ORAL | Status: DC

## 2012-08-30 NOTE — Telephone Encounter (Signed)
Rx sent 

## 2012-09-20 ENCOUNTER — Telehealth: Payer: Self-pay

## 2012-09-20 NOTE — Telephone Encounter (Signed)
2000mg  is the max dose -----hes taking 1500

## 2012-09-20 NOTE — Telephone Encounter (Signed)
DM uncontrolled---- glucophage xr 500 mg 2 po bid #120 5 refills Cholesterol--- LDL goal < 100, HDL >40, TG < 150. Diet and exercise will increase HDL and decrease LDL and TG. Fish, Fish Oil, Flaxseed oil will also help increase the HDL and decrease Triglycerides. Recheck labs in 3 months--------[id number] [id number] 250.00 Lipid hep, bmp, hgba1   Rx was sent to the pharmacy for Metformin XR 750 2 tab by mouth twice a day #120 with 5 refills instead of the above new Rx. Rx was sent for the incorrect dose and the patient is concerned about the dose being too much. I advised for now just take the medication Metformin XR 750 2 tab once daily until I gave him a call back. Would you like for him to take it once a day as directed in the past since he already has the medication?  Please advise   KP

## 2012-09-21 MED ORDER — METFORMIN HCL ER 500 MG PO TB24: 1000.0000 mg | ORAL_TABLET | Freq: Two times a day (BID) | ORAL | Status: DC

## 2012-09-21 NOTE — Addendum Note (Signed)
Addended by: Arnette Norris on: 09/21/2012 08:58 AM   Modules accepted: Orders, Medications

## 2012-09-21 NOTE — Telephone Encounter (Signed)
Discussed with patient and he will continue taking 750 XR 2 po qd and change therapy when the medication is complete. I have sent the new Rx to the pharmacy.     KP

## 2012-09-21 NOTE — Telephone Encounter (Signed)
The wrong Rx was sent to he pharmacy it was sent for 750 Xr 2 tab twice a day. The patient was previously on 750 xr 2 once a day. He already has the medication and did not want to change. Is it okay for him to continue the 750 xr 2 once daily until he has completed his Rx. Please advise     KP

## 2012-09-21 NOTE — Telephone Encounter (Signed)
Rx was sent to the pharmacy for Metformin XR 750 2 tab by mouth twice a day #120 with 5 refills instead of the above new Rx. Rx was sent for the incorrect dose and the patient is concerned about the dose being too much. I advised for now just take the medication Metformin XR 750 2 tab once daily until I gave him a call back. Would you like for him to take it once a day as directed in the past since he already has the medication? Please advise KP

## 2012-09-21 NOTE — Telephone Encounter (Signed)
See below---2000mg  is max dose---- I wrote 500 mg 2 tab bid

## 2012-10-13 ENCOUNTER — Telehealth: Payer: Self-pay | Admitting: General Practice

## 2012-10-13 ENCOUNTER — Other Ambulatory Visit: Payer: Self-pay | Admitting: General Practice

## 2012-10-13 MED ORDER — LIRAGLUTIDE 18 MG/3ML ~~LOC~~ SOPN: 0.3000 mL | PEN_INJECTOR | Freq: Every day | SUBCUTANEOUS | Status: DC

## 2012-10-13 NOTE — Telephone Encounter (Signed)
Per [facility] pt does not need a PA for Victoza pens and there are not any limitations to this rx. JLT

## 2013-01-29 ENCOUNTER — Telehealth: Payer: Self-pay

## 2013-01-29 DIAGNOSIS — L602 Onychogryphosis: Secondary | ICD-10-CM

## 2013-01-29 DIAGNOSIS — E119 Type 2 diabetes mellitus without complications: Secondary | ICD-10-CM

## 2013-01-29 MED ORDER — ATORVASTATIN CALCIUM 40 MG PO TABS: 40.0000 mg | ORAL_TABLET | Freq: Every day | ORAL | Status: DC

## 2013-01-29 NOTE — Telephone Encounter (Signed)
Spoke with patient and he stated he needed his toenails cut. He has DM and he said they were long. Per Dr.Lowne ok to do a Podiatry referral.      KP

## 2013-02-01 ENCOUNTER — Ambulatory Visit: Payer: BC Managed Care – PPO | Admitting: Family Medicine

## 2013-02-06 ENCOUNTER — Ambulatory Visit (INDEPENDENT_AMBULATORY_CARE_PROVIDER_SITE_OTHER): Payer: BC Managed Care – PPO | Admitting: Family Medicine

## 2013-02-06 ENCOUNTER — Encounter: Payer: Self-pay | Admitting: Family Medicine

## 2013-02-06 VITALS — BP 132/80 | HR 114 | Temp 97.8°F | Wt 378.4 lb

## 2013-02-06 DIAGNOSIS — M65341 Trigger finger, right ring finger: Secondary | ICD-10-CM

## 2013-02-06 DIAGNOSIS — E119 Type 2 diabetes mellitus without complications: Secondary | ICD-10-CM

## 2013-02-06 DIAGNOSIS — M6530 Trigger finger, unspecified finger: Secondary | ICD-10-CM

## 2013-02-06 DIAGNOSIS — E785 Hyperlipidemia, unspecified: Secondary | ICD-10-CM

## 2013-02-06 DIAGNOSIS — E1159 Type 2 diabetes mellitus with other circulatory complications: Secondary | ICD-10-CM

## 2013-02-06 DIAGNOSIS — I10 Essential (primary) hypertension: Secondary | ICD-10-CM

## 2013-02-06 DIAGNOSIS — D492 Neoplasm of unspecified behavior of bone, soft tissue, and skin: Secondary | ICD-10-CM

## 2013-02-06 DIAGNOSIS — D229 Melanocytic nevi, unspecified: Secondary | ICD-10-CM

## 2013-02-06 LAB — MICROALBUMIN / CREATININE URINE RATIO: Microalb, Ur: 1.6 mg/dL (ref 0.0–1.9)

## 2013-02-06 LAB — CBC WITH DIFFERENTIAL/PLATELET
Basophils Absolute: 0 10*3/uL (ref 0.0–0.1)
Hemoglobin: 15.4 g/dL (ref 13.0–17.0)
Lymphocytes Relative: 21.8 % (ref 12.0–46.0)
Monocytes Relative: 8.8 % (ref 3.0–12.0)
Neutro Abs: 4.8 10*3/uL (ref 1.4–7.7)
Neutrophils Relative %: 68.1 % (ref 43.0–77.0)
RBC: 5.05 Mil/uL (ref 4.22–5.81)
RDW: 13.9 % (ref 11.5–14.6)

## 2013-02-06 LAB — HEPATIC FUNCTION PANEL
Alkaline Phosphatase: 58 U/L (ref 39–117)
Bilirubin, Direct: 0 mg/dL (ref 0.0–0.3)
Total Bilirubin: 0.6 mg/dL (ref 0.3–1.2)
Total Protein: 7.5 g/dL (ref 6.0–8.3)

## 2013-02-06 LAB — LIPID PANEL
HDL: 43.7 mg/dL (ref >39.00)
LDL Cholesterol: 83 mg/dL (ref 0–99)
Total CHOL/HDL Ratio: 3
Triglycerides: 85 mg/dL (ref 0.0–149.0)
VLDL: 17 mg/dL (ref 0.0–40.0)

## 2013-02-06 LAB — POCT URINALYSIS DIPSTICK
Bilirubin, UA: NEGATIVE
Blood, UA: NEGATIVE
Ketones, UA: NEGATIVE
Protein, UA: 0.2
Spec Grav, UA: 1.02
pH, UA: 6

## 2013-02-06 LAB — BASIC METABOLIC PANEL
Calcium: 9 mg/dL (ref 8.4–10.5)
Creatinine, Ser: 0.8 mg/dL (ref 0.4–1.5)
GFR: 123.23 mL/min (ref >60.00)
Sodium: 136 mEq/L (ref 135–145)

## 2013-02-06 NOTE — Assessment & Plan Note (Signed)
Refer to hand surgery

## 2013-02-06 NOTE — Assessment & Plan Note (Signed)
Check labs Cont' meds 

## 2013-02-06 NOTE — Patient Instructions (Signed)
Trigger Finger  Trigger finger (digital tendinitis and stenosing tenosynovitis) is a common disorder that causes an often painful catching of the fingers or thumb. It occurs as a clicking, snapping or locking of a finger in the palm of the hand. The reason for this is that there is a problem with the tendons which flex the fingers sliding smoothly through their sheaths. The cause of this may be inflammation of the tendon and sheath, or from a thickening or nodule in the tendon. The condition may occur in any finger or a couple fingers at the same time. The cause may be overuse while doing the same activity over and over again with your hands.   Tendons are the tough cords that connect the muscles to bones. Muscles and tendons are part of the system which allows your body to move. When muscles contract in the forearm on the palm side, they pull the tendons toward the elbow and cause the fingers and thumb to bend (flex) toward the palm. These are the flexor tendons. The tendons slide through a slippery smooth membrane (synovium) which is called the tendon sheath. The sheaths have areas of tough fibrous tissues surrounding them which hold the tendons close to the bone. These are called pulleys because they work like a pulley. The first pulley is in the palm of the hand near the crease which runs across your palm. If the area of the tendon thickening is near the pulley, the tendon cannot slide smoothly through the pulley and this causes the trigger finger.  The finger may lock with the finger curled or suddenly straighten out with a snap. This is more common in patients with rheumatoid arthritis and diabetes. Left untreated, the condition may get worse to the point where the finger becomes locked in flexion, like making a fist, or less commonly locked with the finger straightened out.  DIAGNOSIS   Your caregiver will easily make this diagnosis on examination.  TREATMENT   · Splinting for 6 to 8 weeks of time may be  helpful. Use the splints as your caregiver suggests.  · Heat used for twenty minutes at least four times a day followed by ice packs for twenty minutes unless directed otherwise by your caregiver may be helpful. If you find either heat or cold seems to be making the problem worse, quit using them and ask your caregiver for directions.  · Cortisone injections along with splinting may speed up recovery. Several injections may be required. Cortisone may give relief after one injection.  · Only take over-the-counter or prescription medicines for pain, discomfort, or fever as directed by your caregiver.  · Surgery is another treatment that may be used if conservative treatments using injection and splinting does not work. Surgery can be minor without incisions (a cut does not have to be made) and can be done with a needle through the skin. No stitches are needed and most patients may return to work the same day.  · Other surgical choices involve an open procedure where the surgeon opens the hand through a small incision (cut) and cuts the pulley so the tendon can again slide smoothly. Your hand will still work fine. This small operation requires stitches and the recovery will be a little longer and the incisions will need to be protected until completely healed. You may have to limit your activities for up to 6 months.  · Occupational or hand therapy may be required if there is stiffness remaining in the finger.    RISKS AND COMPLICATIONS  Complications are uncommon but some problems that may occur are:  · Recurrence of the trigger finger. This does not mean that the surgery was not well done. It simply means that you may have formed scar tissue following surgery that causes the problem to reoccur.  · Infection which could ruin the results of the surgery and can result in a finger which is frozen and can not move normally.  · Nerve injury is possible which could result in permanent numbness of one or more fingers.  CARE  AFTER SURGERY  · Elevate your hand above your heart and use ice as instructed.  · Follow instructions regarding finger motion/exercise.  · Keep the surgical wound dry for at least 48 hrs or longer if instructed.  · Keep your follow-up appointments.  · Return to work and normal activities as instructed.  SEEK IMMEDIATE MEDICAL CARE IF:   Your problems are getting worse or you do not obtain relief from the treatment.  Document Released: 03/20/2004 Document Revised: 08/23/2011 Document Reviewed: 11/12/2008  [employer]® Patient Information ©2014 [employer].

## 2013-02-06 NOTE — Progress Notes (Signed)
  Subjective:    Patient ID: Richard [name], male    DOB: 1950/10/31, 62 y.o.   MRN: [id number]  HPI   DIABETES Disease Monitoring Blood Sugar ranges-90-118 Polyuria- no New Visual problems- no Medications Compliance- no Hypoglycemic symptoms- no   HYPERLIPIDEMIA Disease Monitoring See symptoms for Hypertension Medications Compliance- good RUQ pain- no  Muscle aches- no  Pt also c/o lesion on L ear that will itch and he will scratch it off and then it goes away.  Pt uses alcohol to dry it up when it does flare.  There is a small bump there now. Pt also c/o trigger finger r hand ring finger --  Pt states it is starting to bother him and would like some relief. ROS See HPI above   PMH Smoking Status noted     Review of Systems As above    Objective:   Physical Exam  BP 132/80  Pulse 114  Temp(Src) 97.8 F (36.6 C) (Oral)  Wt 378 lb 6.4 oz (171.641 kg)  BMI 54.29 kg/m2  SpO2 95% General appearance: alert, cooperative, appears stated age and no distress Ears: TMI b/l  , L ear- + papule ext sup ear lope,  no drainage Nose: Nares normal. Septum midline. Mucosa normal. No drainage or sinus tenderness. Throat: lips, mucosa, and tongue normal; teeth and gums normal Neck: no adenopathy, no carotid bruit, no JVD, supple, symmetrical, trachea midline and thyroid not enlarged, symmetric, no tenderness/mass/nodules Lungs: clear to auscultation bilaterally Heart: S1, S2 normal Extremities: extremities normal, atraumatic, no cyanosis or edema   Sensory exam of the foot is normal, tested with the monofilament. Good pulses, no lesions or ulcers, good peripheral pulses.   L ring finger--- + trigger finger       Assessment & Plan:

## 2013-02-06 NOTE — Assessment & Plan Note (Signed)
con't meds  Check labs 

## 2013-02-06 NOTE — Assessment & Plan Note (Signed)
almosts completely gone now Pt to rto if it flares again

## 2013-03-26 ENCOUNTER — Encounter: Payer: Self-pay | Admitting: Family Medicine

## 2013-03-26 ENCOUNTER — Ambulatory Visit (INDEPENDENT_AMBULATORY_CARE_PROVIDER_SITE_OTHER): Payer: BC Managed Care – PPO | Admitting: Family Medicine

## 2013-03-26 VITALS — BP 136/62 | HR 77 | Temp 98.9°F | Wt 383.0 lb

## 2013-03-26 DIAGNOSIS — J019 Acute sinusitis, unspecified: Secondary | ICD-10-CM

## 2013-03-26 MED ORDER — ATORVASTATIN CALCIUM 40 MG PO TABS: 40.0000 mg | ORAL_TABLET | Freq: Every day | ORAL | Status: DC

## 2013-03-26 MED ORDER — CEFUROXIME AXETIL 500 MG PO TABS: 500.0000 mg | ORAL_TABLET | Freq: Two times a day (BID) | ORAL | Status: AC

## 2013-03-26 MED ORDER — AZELASTINE-FLUTICASONE 137-50 MCG/ACT NA SUSP: 1.0000 | Freq: Two times a day (BID) | NASAL | Status: DC

## 2013-03-26 NOTE — Patient Instructions (Signed)

## 2013-03-26 NOTE — Progress Notes (Signed)
  Subjective:     Richard [name] is a 62 y.o. male who presents for evaluation of sinus pain. Symptoms include: congestion, facial pain, nasal congestion, post nasal drip, sinus pressure and sore throat. Onset of symptoms was 6 days ago. Symptoms have been gradually worsening since that time. Past history is significant for no history of pneumonia or bronchitis. Patient is a non-smoker.  The following portions of the patient's history were reviewed and updated as appropriate: allergies, current medications, past family history, past medical history, past social history, past surgical history and problem list.  Review of Systems Pertinent items are noted in HPI.   Objective:    BP 136/62  Pulse 77  Temp(Src) 98.9 F (37.2 C) (Oral)  Wt 383 lb (173.728 kg)  BMI 54.95 kg/m2  SpO2 95% General appearance: alert, cooperative, appears stated age and no distress Ears: + dull, L bulging Nose: green discharge, moderate congestion, turbinates red, swollen, sinus tenderness bilateral Throat: abnormal findings: mild oropharyngeal erythema and pnd Neck: moderate anterior cervical adenopathy, supple, symmetrical, trachea midline and thyroid not enlarged, symmetric, no tenderness/mass/nodules Lungs: clear to auscultation bilaterally Heart: S1, S2 normal Extremities: extremities normal, atraumatic, no cyanosis or edema    Assessment:    Acute bacterial sinusitis.    Plan:    Nasal steroids per medication orders. Antihistamines per medication orders. Ceftin per medication orders. f/u prn

## 2013-06-21 ENCOUNTER — Other Ambulatory Visit: Payer: Self-pay

## 2013-06-21 DIAGNOSIS — E119 Type 2 diabetes mellitus without complications: Secondary | ICD-10-CM

## 2013-06-21 DIAGNOSIS — E785 Hyperlipidemia, unspecified: Secondary | ICD-10-CM

## 2013-06-21 MED ORDER — LIRAGLUTIDE 18 MG/3ML ~~LOC~~ SOPN: 0.3000 mL | PEN_INJECTOR | Freq: Every day | SUBCUTANEOUS | Status: DC

## 2013-06-21 MED ORDER — METFORMIN HCL ER 500 MG PO TB24: 1000.0000 mg | ORAL_TABLET | Freq: Two times a day (BID) | ORAL | Status: DC

## 2013-06-21 NOTE — Telephone Encounter (Signed)
Med's sent.      KP 

## 2013-06-22 ENCOUNTER — Other Ambulatory Visit: Payer: Self-pay

## 2013-06-22 ENCOUNTER — Other Ambulatory Visit (INDEPENDENT_AMBULATORY_CARE_PROVIDER_SITE_OTHER): Payer: BC Managed Care – PPO

## 2013-06-22 DIAGNOSIS — E119 Type 2 diabetes mellitus without complications: Secondary | ICD-10-CM

## 2013-06-22 DIAGNOSIS — E785 Hyperlipidemia, unspecified: Secondary | ICD-10-CM

## 2013-06-22 LAB — LIPID PANEL
CHOL/HDL RATIO: 5
Cholesterol: 179 mg/dL (ref 0–200)
HDL: 39.3 mg/dL (ref >39.00)
LDL CALC: 122 mg/dL — AB (ref 0–99)
TRIGLYCERIDES: 88 mg/dL (ref 0.0–149.0)
VLDL: 17.6 mg/dL (ref 0.0–40.0)

## 2013-06-22 LAB — HEPATIC FUNCTION PANEL
ALBUMIN: 3.7 g/dL (ref 3.5–5.2)
ALT: 20 U/L (ref 0–53)
AST: 19 U/L (ref 0–37)
Alkaline Phosphatase: 55 U/L (ref 39–117)
Bilirubin, Direct: 0 mg/dL (ref 0.0–0.3)
Total Bilirubin: 0.7 mg/dL (ref 0.3–1.2)
Total Protein: 7.4 g/dL (ref 6.0–8.3)

## 2013-06-22 LAB — BASIC METABOLIC PANEL
BUN: 10 mg/dL (ref 6–23)
CHLORIDE: 102 meq/L (ref 96–112)
CO2: 26 mEq/L (ref 19–32)
Calcium: 9.2 mg/dL (ref 8.4–10.5)
Creatinine, Ser: 0.9 mg/dL (ref 0.4–1.5)
GFR: 118.07 mL/min (ref >60.00)
Glucose, Bld: 120 mg/dL — ABNORMAL HIGH (ref 70–99)
Potassium: 4.2 mEq/L (ref 3.5–5.1)
SODIUM: 136 meq/L (ref 135–145)

## 2013-06-22 LAB — HEMOGLOBIN A1C: Hgb A1c MFr Bld: 8.9 % — ABNORMAL HIGH (ref 4.6–6.5)

## 2013-06-22 MED ORDER — INSULIN PEN NEEDLE 32G X 6 MM MISC: 1.0000 "pen " | Freq: Every day | Status: DC

## 2013-06-25 DIAGNOSIS — E119 Type 2 diabetes mellitus without complications: Secondary | ICD-10-CM

## 2013-07-24 ENCOUNTER — Telehealth: Payer: Self-pay

## 2013-07-24 NOTE — Telephone Encounter (Signed)
Diabetes not controlled-----refer to Endo Cholesterol--- LDL goal < 70, HDL >40, TG < 150. Diet and exercise will increase HDL and decrease LDL and TG. Fish, Fish Oil, Flaxseed oil will also help increase the HDL and decrease Triglycerides. Recheck labs in 3 months------ 272.4 250.01 Lipid, hep, bmp, hgba1c.    Spoke with patient and he was concerned about going to the Endocrinologist. I explained the reason along with discuss his history and advised the regimen Dr.Lowne is doing for him is no longer working for him and it is time that he see's a specialist. He wanted to know why he was not sent in the past and I made him aware we were able to manage it however the jump over the last 5 months shows [place] that what we are doing is not enough. He voiced understanding and thanked me for discussing with him.       KP

## 2013-07-27 ENCOUNTER — Encounter: Payer: Self-pay | Admitting: Family Medicine

## 2013-07-27 ENCOUNTER — Ambulatory Visit (INDEPENDENT_AMBULATORY_CARE_PROVIDER_SITE_OTHER): Payer: BC Managed Care – PPO | Admitting: Family Medicine

## 2013-07-27 VITALS — BP 124/72 | HR 76 | Temp 98.3°F | Wt 376.0 lb

## 2013-07-27 DIAGNOSIS — J019 Acute sinusitis, unspecified: Secondary | ICD-10-CM

## 2013-07-27 MED ORDER — ATORVASTATIN CALCIUM 40 MG PO TABS: 40.0000 mg | ORAL_TABLET | Freq: Every day | ORAL | Status: DC

## 2013-07-27 MED ORDER — CEFUROXIME AXETIL 500 MG PO TABS: 500.0000 mg | ORAL_TABLET | Freq: Two times a day (BID) | ORAL | Status: AC

## 2013-07-27 MED ORDER — LIRAGLUTIDE 18 MG/3ML ~~LOC~~ SOPN: 0.3000 mL | PEN_INJECTOR | Freq: Every day | SUBCUTANEOUS | Status: DC

## 2013-07-27 MED ORDER — METFORMIN HCL ER 500 MG PO TB24: 1000.0000 mg | ORAL_TABLET | Freq: Two times a day (BID) | ORAL | Status: DC

## 2013-07-27 NOTE — Progress Notes (Signed)
Pre visit review using our clinic review tool, if applicable. No additional management support is needed unless otherwise documented below in the visit note. 

## 2013-07-27 NOTE — Patient Instructions (Signed)

## 2013-07-27 NOTE — Progress Notes (Signed)
  Subjective:     Richard [name] is a 63 y.o. male who presents for evaluation of sinus pain. Symptoms include: congestion, cough, facial pain, nasal congestion, purulent rhinorrhea and sinus pressure. Onset of symptoms was 3 days ago. Symptoms have been gradually worsening since that time. Past history is significant for no history of pneumonia or bronchitis. Patient is a non-smoker.  The following portions of the patient's history were reviewed and updated as appropriate: allergies, current medications, past family history, past medical history, past social history, past surgical history and problem list.  Review of Systems Pertinent items are noted in HPI.   Objective:    BP 124/72  Pulse 76  Temp(Src) 98.3 F (36.8 C) (Oral)  Wt 376 lb (170.552 kg)  SpO2 96% General appearance: alert, cooperative, appears stated age and no distress Ears: normal TM's and external ear canals both ears Nose: green discharge, moderate congestion, turbinates red, swollen, sinus tenderness bilateral Throat: abnormal findings: moderate oropharyngeal erythema Neck: moderate anterior cervical adenopathy, supple, symmetrical, trachea midline and thyroid not enlarged, symmetric, no tenderness/mass/nodules Lungs: clear to auscultation bilaterally Heart: S1, S2 normal Extremities: extremities normal, atraumatic, no cyanosis or edema    Assessment:    Acute bacterial sinusitis.    Plan:    Nasal steroids per medication orders. Antihistamines per medication orders. Ceftin per medication orders.

## 2013-08-20 ENCOUNTER — Encounter: Payer: Self-pay | Admitting: Endocrinology

## 2013-08-20 ENCOUNTER — Ambulatory Visit (INDEPENDENT_AMBULATORY_CARE_PROVIDER_SITE_OTHER): Payer: BC Managed Care – PPO | Admitting: Endocrinology

## 2013-08-20 VITALS — BP 138/78 | HR 110 | Temp 98.7°F | Resp 16 | Ht 71.75 in | Wt 370.6 lb

## 2013-08-20 DIAGNOSIS — E669 Obesity, unspecified: Secondary | ICD-10-CM

## 2013-08-20 DIAGNOSIS — E1165 Type 2 diabetes mellitus with hyperglycemia: Principal | ICD-10-CM

## 2013-08-20 DIAGNOSIS — IMO0001 Reserved for inherently not codable concepts without codable children: Secondary | ICD-10-CM

## 2013-08-20 DIAGNOSIS — E785 Hyperlipidemia, unspecified: Secondary | ICD-10-CM

## 2013-08-20 NOTE — Progress Notes (Signed)
Patient ID: Richard [name], male   DOB: 07/19/1950, 63 y.o.   MRN: [id number]   Reason for Appointment : Consultation for Type 2 Diabetes  History of Present Illness          Diagnosis: Type 2 diabetes mellitus, date of diagnosis: 2005       Past history: His initial symptoms at diagnosis were excessive thirst and his glucose was 414. It is not known what medications he was treated with initially but he thinks he was started on metformin about 5 years ago He was subsequently put on Victoza about 2-3 years ago for improved control. He thinks he may have lost about 6 pounds initially with this but subsequently did not lose any weight  Recent history:  With his regimen of metformin and Victoza his A1c has been around 7% in the last year. However he thinks his blood sugars have previously been fairly close to normal He has been referred for evaluation of his diabetes since his A1c had increased to 8.9% in 1/15  Review of his home glucose readings from November and December 2014 shows that he had readings as high as 212 but was also checking very sporadically in December. At that time he was not following any diet and eating a lot of sweets and desserts Since January he has been more particular about his diet and has eliminated desserts that he was having Recently his blood sugars at home are excellent although he is usually not checking readings after dinner        Oral hypoglycemic drugs the patient is taking are: Metformin ER 2 g daily     Side effects from medications have been: None   Glucose monitoring:  done one time a day         Glucometer: One Touch.      Blood Glucose readings from meter download:   PREMEAL Breakfast Lunch  afternoon   evening  Overall  Glucose range:  83-134  69-110   58-169   92, 85    Mean 109     100    Hypoglycemia: He will occasionally have readings in the 60s accompanied by a feeling of hunger and a strange feeling, does not feel weak or shaky but is  relieved by simple sugars. Mostly having the symptoms in the afternoons  Glycemic control:  Lab Results  Component Value Date   HGBA1C 8.9* 06/22/2013   HGBA1C 7.2* 02/06/2013   HGBA1C 7.1* 06/30/2012   Lab Results  Component Value Date   MICROALBUR 1.6 02/06/2013   LDLCALC 122* 06/22/2013   CREATININE 0.9 06/22/2013    Self-care: Compliance with the medical regimen: Inconsistent The diet that the patient has been following is: tries to limit carbohydrates.      Meals: 3 meals per day. Smaller lunch usually. Usually has some meat at each meal, not restricting fat intake consistently          Exercise: none, not motivated. He does have a membership for a gym        Dietician visit: Most recent: 2005.               Retinal exam: Most recent: 7/14 .    Weight history: Wt Readings from Last 3 Encounters:  08/20/13 370 lb 9.6 oz (168.103 kg)  07/27/13 376 lb (170.552 kg)  03/26/13 383 lb (173.728 kg)      Medication List       This list is accurate as of: 08/20/13 11:59  PM.  Always use your most recent med list.               atorvastatin 40 MG tablet  Commonly known as:  LIPITOR  Take 1 tablet (40 mg total) by mouth at bedtime.     glucose blood test strip  Use as instructed     Insulin Pen Needle 32G X 6 MM Misc  Commonly known as:  NOVOFINE  Inject 1 pen into the skin daily.     Liraglutide 18 MG/3ML Sopn  Commonly known as:  VICTOZA  Inject 0.3 mLs into the skin daily.     metFORMIN 500 MG 24 hr tablet  Commonly known as:  GLUCOPHAGE XR  Take 2 tablets (1,000 mg total) by mouth 2 (two) times daily.        Allergies:  Allergies  Allergen Reactions  . Vicodin [Hydrocodone-Acetaminophen]     Past Medical History  Diagnosis Date  . Hyperlipidemia   . Diabetes mellitus     No past surgical history on file.  Family History  Problem Relation Age of Onset  . Diabetes Mother   . Diabetes Sister   . Hypertension Sister   . Stroke Sister   . Diabetes  Brother     Social History:  reports that he has never smoked. He has never used smokeless tobacco. He reports that he drinks alcohol. He reports that he does not use illicit drugs.    Review of Systems       Lipids:  On Rx 1 year with Lipitor. However with not taking the medication regularly when Lipitor done in 1/15  Lab Results  Component Value Date   CHOL 179 06/22/2013   HDL 39.30 06/22/2013   LDLCALC 122* 06/22/2013   LDLDIRECT 148.7 03/17/2010   TRIG 88.0 06/22/2013   CHOLHDL 5 06/22/2013        No unusual headaches.                  Skin: No rash or infections     Thyroid:  No  unusual fatigue.     The blood pressure has been normal without any medications      No swelling of feet.     No shortness of breath on exertion.     Bowel habits: Normal.       No frequency of urination or nocturia       No joint  pains.         No history of Numbness, tingling or burning in  feet     LABS:  No visits with results within 1 Week(s) from this visit. Latest known visit with results is:  Appointment on 06/22/2013  Component Date Value Ref Range Status  . Cholesterol 06/22/2013 179  0 - 200 mg/dL Final   ATP III Classification       Desirable:  < 200 mg/dL               Borderline High:  200 - 239 mg/dL          High:  > = 240 mg/dL  . Triglycerides 06/22/2013 88.0  0.0 - 149.0 mg/dL Final   Normal:  <150 mg/dLBorderline High:  150 - 199 mg/dL  . HDL 06/22/2013 39.30  >39.00 mg/dL Final  . VLDL 06/22/2013 17.6  0.0 - 40.0 mg/dL Final  . LDL Cholesterol 06/22/2013 122* 0 - 99 mg/dL Final  . Total CHOL/HDL Ratio 06/22/2013 5   Final  Men          Women1/2 Average Risk     3.4          3.3Average Risk          5.0          4.42X Average Risk          9.6          7.13X Average Risk          15.0          11.0                      . Total Bilirubin 06/22/2013 0.7  0.3 - 1.2 mg/dL Final  . Bilirubin, Direct 06/22/2013 0.0  0.0 - 0.3 mg/dL Final  . Alkaline  Phosphatase 06/22/2013 55  39 - 117 U/L Final  . AST 06/22/2013 19  0 - 37 U/L Final  . ALT 06/22/2013 20  0 - 53 U/L Final  . Total Protein 06/22/2013 7.4  6.0 - 8.3 g/dL Final  . Albumin 06/22/2013 3.7  3.5 - 5.2 g/dL Final  . Sodium 06/22/2013 136  135 - 145 mEq/L Final  . Potassium 06/22/2013 4.2  3.5 - 5.1 mEq/L Final  . Chloride 06/22/2013 102  96 - 112 mEq/L Final  . CO2 06/22/2013 26  19 - 32 mEq/L Final  . Glucose, Bld 06/22/2013 120* 70 - 99 mg/dL Final  . BUN 06/22/2013 10  6 - 23 mg/dL Final  . Creatinine, Ser 06/22/2013 0.9  0.4 - 1.5 mg/dL Final  . Calcium 06/22/2013 9.2  8.4 - 10.5 mg/dL Final  . GFR 06/22/2013 118.07  >60.00 mL/min Final  . Hemoglobin A1C 06/22/2013 8.9* 4.6 - 6.5 % Final   Glycemic Control Guidelines for People with Diabetes:Non Diabetic:  <6%Goal of Therapy: <7%Additional Action Suggested:  >8%     Physical Examination:  BP 138/78  Pulse 110  Temp(Src) 98.7 F (37.1 C)  Resp 16  Ht 5' 11.75" (1.822 m)  Wt 370 lb 9.6 oz (168.103 kg)  BMI 50.64 kg/m2  SpO2 95%  GENERAL:         Patient has marked generalized obesity.   HEENT:         Eye exam shows normal external appearance. Fundus exam shows no retinopathy. Oral exam shows normal mucosa .  NECK:         General:  Neck exam shows no lymphadenopathy. Carotids are normal to palpation and no bruit heard.  Thyroid is not enlarged and no nodules felt.   LUNGS:         Chest is symmetrical. Lungs are clear to auscultation.Marland Kitchen   HEART:         Heart sounds:  S1 and S2 are normal. No murmurs or clicks heard., no S3 or S4.   ABDOMEN:         General:  There is no distention present. Liver and spleen are not palpable. No other mass or tenderness present.  EXTREMITIES:     There is no edema. No skin lesions present.Marland Kitchen  NEUROLOGICAL:        Vibration sense is mildly reduced in toes. Ankle jerks are absent bilaterally.          Diabetic foot exam:  as in the foot exam section MUSCULOSKELETAL:       There is  no enlargement or deformity of the joints. Spine is normal to inspection.Marland Kitchen   PEDAL pulses:  Normal SKIN:       No rash or lesions of concern.        ASSESSMENT/PLAN:   Diabetes type 2 His blood sugars recently are excellent and his high A1c in January was related to poor diet in the prior 2 months Although he has excellent readings during the day he has not checked readings consistently after dinner and not clear if he may be still have high readings Discussed day-to-day management of diabetes and the importance of weight loss He has not been motivated to exercise and stressed the importance of regular aerobic activity He does have a membership to a gym and encouraged him to start going there at least 3 days a week  He will also check more readings after dinner Because of his tendency to mild hypoglycemia in the afternoons he can reduce his morning metformin to 500 mg Will need to have an A1c done in the next couple of months to confirm that he has better control  Complications: None evident, discussed importance of regular complete eye exams and also regular foot exams  Hypercholesterolemia:  He has been reminded to be regular with his Lipitor for hypercholesterolemia and will have followup done with PCP Stressed importance of taking statin drugs because of his diabetes and history of high lipids   Saylah Ketner 08/21/2013, 11:43 AM

## 2013-08-20 NOTE — Patient Instructions (Addendum)
Gradually Work up exercise to 4 times a week, 30-40 min each time  Reduce am Metformin to 1  Low fat and low carb high fiber diet

## 2013-09-03 ENCOUNTER — Other Ambulatory Visit: Payer: Self-pay | Admitting: Family Medicine

## 2013-09-11 ENCOUNTER — Ambulatory Visit (INDEPENDENT_AMBULATORY_CARE_PROVIDER_SITE_OTHER): Payer: BC Managed Care – PPO | Admitting: Family Medicine

## 2013-09-11 ENCOUNTER — Encounter: Payer: Self-pay | Admitting: Family Medicine

## 2013-09-11 VITALS — BP 132/68 | HR 105 | Temp 98.4°F | Wt 370.4 lb

## 2013-09-11 DIAGNOSIS — R52 Pain, unspecified: Secondary | ICD-10-CM

## 2013-09-11 DIAGNOSIS — J111 Influenza due to unidentified influenza virus with other respiratory manifestations: Secondary | ICD-10-CM

## 2013-09-11 DIAGNOSIS — J189 Pneumonia, unspecified organism: Secondary | ICD-10-CM

## 2013-09-11 LAB — POCT INFLUENZA A/B
Influenza A, POC: POSITIVE
Influenza B, POC: NEGATIVE

## 2013-09-11 MED ORDER — CLARITHROMYCIN ER 500 MG PO TB24: 1000.0000 mg | ORAL_TABLET | Freq: Every day | ORAL | Status: AC

## 2013-09-11 MED ORDER — GUAIFENESIN-CODEINE 100-10 MG/5ML PO SYRP: ORAL_SOLUTION | ORAL | Status: DC

## 2013-09-11 MED ORDER — OSELTAMIVIR PHOSPHATE 75 MG PO CAPS: 75.0000 mg | ORAL_CAPSULE | Freq: Two times a day (BID) | ORAL | Status: DC

## 2013-09-11 NOTE — Patient Instructions (Signed)

## 2013-09-11 NOTE — Progress Notes (Signed)
Pre visit review using our clinic review tool, if applicable. No additional management support is needed unless otherwise documented below in the visit note. 

## 2013-09-11 NOTE — Progress Notes (Signed)
  Subjective:     Richard [name] is a 63 y.o. male who presents for evaluation of symptoms of a URI. Symptoms include achiness, congestion, fever 101, nasal congestion, post nasal drip, productive cough with  green colored sputum, shortness of breath and sinus pressure. Onset of symptoms was 3 days ago, and has been gradually worsening since that time. Treatment to date: none.  The following portions of the patient's history were reviewed and updated as appropriate: allergies, current medications, past family history, past medical history, past social history, past surgical history and problem list.  Review of Systems Pertinent items are noted in HPI.   Objective:    BP 132/68  Pulse 105  Temp(Src) 98.4 F (36.9 C) (Oral)  Wt 370 lb 6.4 oz (168.012 kg)  SpO2 95% General appearance: alert, cooperative, appears stated age and no distress Ears: normal TM's and external ear canals both ears Nose: Nares normal. Septum midline. Mucosa normal. No drainage or sinus tenderness. Throat: lips, mucosa, and tongue normal; teeth and gums normal Neck: moderate anterior cervical adenopathy, no JVD, supple, symmetrical, trachea midline and thyroid not enlarged, symmetric, no tenderness/mass/nodules Lungs: diminished breath sounds bilaterally Heart: S1, S2 normal Extremities: extremities normal, atraumatic, no cyanosis or edema   Assessment:    bronchitis and influenza   Plan:    Suggested symptomatic OTC remedies. Nasal saline spray for congestion. biaxin per orders. Nasal steroids per orders. Follow up as needed. tamiflu for 5 days  F/u prn

## 2013-09-12 ENCOUNTER — Ambulatory Visit
Admission: RE | Admit: 2013-09-12 | Discharge: 2013-09-12 | Disposition: A | Discharge status: Home / Self Care | Payer: BC Managed Care – PPO | Source: Ambulatory Visit | Attending: Family Medicine | Admitting: Family Medicine

## 2013-09-12 DIAGNOSIS — J189 Pneumonia, unspecified organism: Secondary | ICD-10-CM

## 2013-10-11 ENCOUNTER — Telehealth: Payer: Self-pay

## 2013-10-11 DIAGNOSIS — E109 Type 1 diabetes mellitus without complications: Secondary | ICD-10-CM

## 2013-10-11 DIAGNOSIS — E785 Hyperlipidemia, unspecified: Secondary | ICD-10-CM

## 2013-10-11 NOTE — Telephone Encounter (Signed)
272.4 250.01 Lipid, hep, bmp, hgba1c. Patient called to have labs scheduled. He stated that he also had congestion and I advised to take the allegra-d for 3 days and if still symptomatic he needs an appointment. He asked for pain med's for his tooth and I advised he would need apt. Apt scheduled for tomorrow at 1pm.     KP

## 2013-10-12 ENCOUNTER — Encounter: Payer: Self-pay | Admitting: Family Medicine

## 2013-10-12 ENCOUNTER — Other Ambulatory Visit (INDEPENDENT_AMBULATORY_CARE_PROVIDER_SITE_OTHER): Payer: BC Managed Care – PPO

## 2013-10-12 ENCOUNTER — Ambulatory Visit (INDEPENDENT_AMBULATORY_CARE_PROVIDER_SITE_OTHER): Payer: BC Managed Care – PPO | Admitting: Family Medicine

## 2013-10-12 VITALS — BP 144/82 | HR 63 | Temp 98.3°F | Wt 367.0 lb

## 2013-10-12 DIAGNOSIS — E109 Type 1 diabetes mellitus without complications: Secondary | ICD-10-CM

## 2013-10-12 DIAGNOSIS — R059 Cough, unspecified: Secondary | ICD-10-CM

## 2013-10-12 DIAGNOSIS — R05 Cough: Secondary | ICD-10-CM

## 2013-10-12 DIAGNOSIS — J019 Acute sinusitis, unspecified: Secondary | ICD-10-CM

## 2013-10-12 DIAGNOSIS — E785 Hyperlipidemia, unspecified: Secondary | ICD-10-CM

## 2013-10-12 DIAGNOSIS — K047 Periapical abscess without sinus: Secondary | ICD-10-CM

## 2013-10-12 DIAGNOSIS — E1159 Type 2 diabetes mellitus with other circulatory complications: Secondary | ICD-10-CM

## 2013-10-12 LAB — LIPID PANEL
CHOLESTEROL: 106 mg/dL (ref 0–200)
HDL: 38.7 mg/dL — ABNORMAL LOW (ref >39.00)
LDL Cholesterol: 49 mg/dL (ref 0–99)
TRIGLYCERIDES: 91 mg/dL (ref 0.0–149.0)
Total CHOL/HDL Ratio: 3
VLDL: 18.2 mg/dL (ref 0.0–40.0)

## 2013-10-12 LAB — BASIC METABOLIC PANEL
BUN: 13 mg/dL (ref 6–23)
CALCIUM: 8.8 mg/dL (ref 8.4–10.5)
CO2: 27 meq/L (ref 19–32)
CREATININE: 0.8 mg/dL (ref 0.4–1.5)
Chloride: 103 mEq/L (ref 96–112)
GFR: 132.2 mL/min (ref >60.00)
Glucose, Bld: 116 mg/dL — ABNORMAL HIGH (ref 70–99)
Potassium: 4.1 mEq/L (ref 3.5–5.1)
SODIUM: 138 meq/L (ref 135–145)

## 2013-10-12 LAB — HEPATIC FUNCTION PANEL
ALK PHOS: 61 U/L (ref 39–117)
ALT: 18 U/L (ref 0–53)
AST: 18 U/L (ref 0–37)
Albumin: 3.7 g/dL (ref 3.5–5.2)
BILIRUBIN DIRECT: 0 mg/dL (ref 0.0–0.3)
TOTAL PROTEIN: 7.4 g/dL (ref 6.0–8.3)
Total Bilirubin: 0.6 mg/dL (ref 0.3–1.2)

## 2013-10-12 LAB — HEMOGLOBIN A1C: Hgb A1c MFr Bld: 6.5 % (ref 4.6–6.5)

## 2013-10-12 MED ORDER — LEVOFLOXACIN 500 MG PO TABS: 500.0000 mg | ORAL_TABLET | Freq: Every day | ORAL | Status: DC

## 2013-10-12 MED ORDER — FLUTICASONE PROPIONATE 50 MCG/ACT NA SUSP: 2.0000 | Freq: Every day | NASAL | Status: DC

## 2013-10-12 MED ORDER — GUAIFENESIN-CODEINE 100-10 MG/5ML PO SYRP: ORAL_SOLUTION | ORAL | Status: DC

## 2013-10-12 MED ORDER — HYDROCODONE-ACETAMINOPHEN 10-325 MG PO TABS: 1.0000 | ORAL_TABLET | Freq: Three times a day (TID) | ORAL | Status: DC | PRN

## 2013-10-12 NOTE — Progress Notes (Signed)
Pre visit review using our clinic review tool, if applicable. No additional management support is needed unless otherwise documented below in the visit note. 

## 2013-10-12 NOTE — Progress Notes (Signed)
  Subjective:     Richard [name] is a 63 y.o. male who presents for evaluation of sinus pain. Symptoms include: congestion, cough, nasal congestion and sinus pressure. Onset of symptoms was 3 weeks ago. Symptoms have been gradually worsening since that time. Past history is significant for recent bronchitis and flu. Patient is a non-smoker.  The following portions of the patient's history were reviewed and updated as appropriate: allergies, current medications, past family history, past medical history, past social history, past surgical history and problem list.  Review of Systems Pertinent items are noted in HPI.   Objective:    BP 144/82  Pulse 63  Temp(Src) 98.3 F (36.8 C) (Oral)  Wt 367 lb (166.47 kg)  SpO2 96% General appearance: alert, cooperative, appears stated age and no distress Ears: normal TM's and external ear canals both ears Nose: green discharge, moderate congestion, turbinates red, swollen, sinus tenderness bilateral Throat: lips, mucosa, and tongue normal; teeth and gums normal Neck: moderate anterior cervical adenopathy, supple, symmetrical, trachea midline and thyroid not enlarged, symmetric, no tenderness/mass/nodules Lungs: clear to auscultation bilaterally Heart: regular rate and rhythm, S1, S2 normal, no murmur, click, rub or gallop Extremities: extremities normal, atraumatic, no cyanosis or edema    Assessment:    Acute bacterial sinusitis.    Plan:    Nasal steroids per medication orders. Antihistamines per medication orders. levaquin per medication orders.   1. Sinusitis, acute  - fluticasone (FLONASE) 50 MCG/ACT nasal spray; Place 2 sprays into both nostrils daily.  Dispense: 16 g; Refill: 5 - levofloxacin (LEVAQUIN) 500 MG tablet; Take 1 tablet (500 mg total) by mouth daily.  Dispense: 10 tablet; Refill: 0 - HYDROcodone-acetaminophen (NORCO) 10-325 MG per tablet; Take 1 tablet by mouth every 8 (eight) hours as needed.  Dispense: 30 tablet;  Refill: 0  2. Cough  - guaiFENesin-codeine (ROBITUSSIN AC) 100-10 MG/5ML syrup; 1-2 tsp po qhs prn cough  Dispense: 120 mL; Refill: 0  3. Type II or unspecified type diabetes mellitus with peripheral circulatory disorders, uncontrolled(250.72)   4. Tooth abscess levaquin 500 mg qd  Dentist app  - HYDROcodone-acetaminophen (NORCO) 10-325 MG per tablet; Take 1 tablet by mouth every 8 (eight) hours as needed.  Dispense: 30 tablet; Refill: 0

## 2013-10-12 NOTE — Patient Instructions (Signed)

## 2014-01-11 ENCOUNTER — Telehealth: Payer: Self-pay

## 2014-01-11 NOTE — Telephone Encounter (Signed)
Diabetic Bundle. Pt no longer seen by Dr. Dwyane Dee. PCP Dr. Etter Sjogren is follow Diabetes

## 2014-02-04 ENCOUNTER — Encounter: Payer: Self-pay | Admitting: General Practice

## 2014-02-25 ENCOUNTER — Telehealth: Payer: Self-pay | Admitting: *Deleted

## 2014-02-25 NOTE — Telephone Encounter (Signed)
VM left for pt in regards to DB f/u for a BP check  

## 2014-03-09 ENCOUNTER — Other Ambulatory Visit: Payer: Self-pay | Admitting: Family Medicine

## 2014-04-12 ENCOUNTER — Telehealth: Payer: Self-pay | Admitting: Family Medicine

## 2014-04-12 NOTE — Telephone Encounter (Signed)
Spoke with the patient and made him aware that he is due for an office visit, apt scheduled 11/16.     KP

## 2014-04-12 NOTE — Telephone Encounter (Signed)
Pt states he would like for dr. Etter Sjogren to let him know when she wants to see him again. Pt states he wanting an appt next week however if she can not see him next week to just let him know when he should come back in for a follow up

## 2014-04-24 ENCOUNTER — Encounter: Payer: Self-pay | Admitting: Internal Medicine

## 2014-04-29 ENCOUNTER — Ambulatory Visit (INDEPENDENT_AMBULATORY_CARE_PROVIDER_SITE_OTHER): Payer: BC Managed Care – PPO | Admitting: Family Medicine

## 2014-04-29 ENCOUNTER — Encounter: Payer: Self-pay | Admitting: Family Medicine

## 2014-04-29 VITALS — BP 140/76 | HR 77 | Temp 98.7°F | Wt 386.8 lb

## 2014-04-29 DIAGNOSIS — K047 Periapical abscess without sinus: Secondary | ICD-10-CM

## 2014-04-29 DIAGNOSIS — E785 Hyperlipidemia, unspecified: Secondary | ICD-10-CM

## 2014-04-29 DIAGNOSIS — J019 Acute sinusitis, unspecified: Secondary | ICD-10-CM

## 2014-04-29 DIAGNOSIS — E119 Type 2 diabetes mellitus without complications: Secondary | ICD-10-CM

## 2014-04-29 LAB — POCT URINALYSIS DIPSTICK
Bilirubin, UA: NEGATIVE
Blood, UA: NEGATIVE
Glucose, UA: NEGATIVE
KETONES UA: NEGATIVE
Leukocytes, UA: NEGATIVE
Nitrite, UA: NEGATIVE
Protein, UA: NEGATIVE
Urobilinogen, UA: 2
pH, UA: 5.5

## 2014-04-29 LAB — HEPATIC FUNCTION PANEL
ALT: 16 U/L (ref 0–53)
AST: 19 U/L (ref 0–37)
Albumin: 4 g/dL (ref 3.5–5.2)
Alkaline Phosphatase: 50 U/L (ref 39–117)
BILIRUBIN TOTAL: 1 mg/dL (ref 0.2–1.2)
Bilirubin, Direct: 0 mg/dL (ref 0.0–0.3)
Total Protein: 7.5 g/dL (ref 6.0–8.3)

## 2014-04-29 LAB — BASIC METABOLIC PANEL
BUN: 13 mg/dL (ref 6–23)
CALCIUM: 8.7 mg/dL (ref 8.4–10.5)
CO2: 24 mEq/L (ref 19–32)
Chloride: 104 mEq/L (ref 96–112)
Creatinine, Ser: 0.8 mg/dL (ref 0.4–1.5)
GFR: 128.12 mL/min (ref >60.00)
Glucose, Bld: 108 mg/dL — ABNORMAL HIGH (ref 70–99)
Potassium: 4.1 mEq/L (ref 3.5–5.1)
Sodium: 139 mEq/L (ref 135–145)

## 2014-04-29 LAB — LIPID PANEL
CHOLESTEROL: 125 mg/dL (ref 0–200)
HDL: 41.3 mg/dL (ref >39.00)
LDL CALC: 61 mg/dL (ref 0–99)
NonHDL: 83.7
TRIGLYCERIDES: 116 mg/dL (ref 0.0–149.0)
Total CHOL/HDL Ratio: 3
VLDL: 23.2 mg/dL (ref 0.0–40.0)

## 2014-04-29 LAB — HEMOGLOBIN A1C: Hgb A1c MFr Bld: 6.7 % — ABNORMAL HIGH (ref 4.6–6.5)

## 2014-04-29 MED ORDER — METFORMIN HCL ER 500 MG PO TB24: ORAL_TABLET | ORAL | Status: DC

## 2014-04-29 MED ORDER — GLUCOSE BLOOD VI STRP: ORAL_STRIP | Status: DC

## 2014-04-29 MED ORDER — FLUTICASONE PROPIONATE 50 MCG/ACT NA SUSP: 2.0000 | Freq: Every day | NASAL | Status: DC

## 2014-04-29 MED ORDER — HYDROCODONE-ACETAMINOPHEN 10-325 MG PO TABS: 1.0000 | ORAL_TABLET | Freq: Three times a day (TID) | ORAL | Status: DC | PRN

## 2014-04-29 MED ORDER — LIRAGLUTIDE 18 MG/3ML ~~LOC~~ SOPN: 0.3000 mL | PEN_INJECTOR | Freq: Every day | SUBCUTANEOUS | Status: DC

## 2014-04-29 MED ORDER — ATORVASTATIN CALCIUM 40 MG PO TABS: 40.0000 mg | ORAL_TABLET | Freq: Every day | ORAL | Status: DC

## 2014-04-29 MED ORDER — INSULIN PEN NEEDLE 32G X 6 MM MISC: 1.0000 "pen " | Freq: Every day | Status: DC

## 2014-04-29 NOTE — Progress Notes (Signed)
   Subjective:    Patient ID: Richard [name], male    DOB: [birth year], 63 y.o.   MRN: [id number]  HPI  DIABETES-- per endo  Blood Sugar ranges-good per pt  Polyuria- no New Visual problems- no Hypoglycemic symptoms- no Other side effects-no Medication compliance - good Last eye exam- 01/2013 Foot exam- today  HYPERLIPIDEMIA  Medication compliance- good RUQ pain- no  Muscle aches- no Other side effects-no     Review of Systems As above     Objective:   Physical Exam . BP 140/76 mmHg  Pulse 77  Temp(Src) 98.7 F (37.1 C) (Oral)  Wt 386 lb 12.8 oz (175.451 kg)  SpO2 95% General appearance: alert, cooperative, appears stated age and no distress Neck: no adenopathy, supple, symmetrical, trachea midline and thyroid not enlarged, symmetric, no tenderness/mass/nodules Lungs: clear to auscultation bilaterally Heart: regular rate and rhythm, S1, S2 normal, no murmur, click, rub or gallop Extremities: extremities normal, atraumatic, no cyanosis or edema  Sensory exam of the foot is normal, tested with the monofilament. Good pulses, no lesions or ulcers, good peripheral pulses.-- toenails on both big toes permanently removed by podiatry        Assessment & Plan:  1. Diabetes type 2, controlled Check labs, con't meds,  Pt refused vaccines - glucose blood (ONE TOUCH ULTRA TEST) test strip; Check blood sugar once daily  Dispense: 100 each; Refill: 5 - Insulin Pen Needle (NOVOFINE) 32G X 6 MM MISC; Inject 1 pen into the skin daily.  Dispense: 100 each; Refill: 5 - Liraglutide (VICTOZA) 18 MG/3ML SOPN; Inject 0.3 mLs into the skin daily.  Dispense: 9 mL; Refill: 5 - metFORMIN (GLUCOPHAGE-XR) 500 MG 24 hr tablet; TAKE 2 TABLETS BY MOUTH TWICE DAILY  Dispense: 360 tablet; Refill: 1 - Basic metabolic panel - Hemoglobin A1c - Microalbumin / creatinine urine ratio - POCT urinalysis dipstick  2. Hyperlipidemia Check labs, con't meds - atorvastatin (LIPITOR) 40 MG tablet; Take 1  tablet (40 mg total) by mouth at bedtime.  Dispense: 90 tablet; Refill: 1 - Hepatic function panel - Lipid panel

## 2014-04-29 NOTE — Progress Notes (Signed)
Pre visit review using our clinic review tool, if applicable. No additional management support is needed unless otherwise documented below in the visit note. 

## 2014-04-29 NOTE — Patient Instructions (Signed)
Diabetes and Sick Day Management Blood sugar (glucose) can be more difficult to control when you are sick. Colds, fever, flu, nausea, vomiting, and diarrhea are all examples of common illnesses that can cause problems for people with diabetes. Loss of body fluids (dehydration) from fever, vomiting, diarrhea, infection, and the stress of a sickness can all cause blood glucose levels to increase. Because of this, it is very important to take your diabetes medicines and to eat some form of carbohydrate food when you are sick. Liquid or soft foods are often tolerated, and they help to replace fluids. HOME CARE INSTRUCTIONS These main guidelines are intended for managing a short-term (24 hours or less) sickness:  Take your usual dose of insulin or oral diabetes medicine. An exception would be if you take any form of metformin. If you cannot eat or drink, you can become dehydrated and should not take this medicine.  Continue to take your insulin even if you are unable to eat solid foods or are vomiting. Your insulin dose may stay the same, or it may need to be increased when you are sick.  You will need to test your blood glucose more often, generally every 2-4 hours. If you have type 1 diabetes, test your urine for ketones every 4 hours. If you have type 2 diabetes, test your urine for ketones as directed by your health care provider.  Eat some form of food that contains carbohydrates. The carbohydrates can be in solid or liquid form. You should eat 45-50 g of carbohydrates every 3-4 hours.  Replace fluids if you have a fever, vomit, or have diarrhea. Ask your health care provider for specific rehydration instructions.  Watch carefully for the signs of ketoacidosis if you have type 1 diabetes. Call your health care provider if any of the following symptoms are present, especially in children:  Moderate to large ketones in the urine along with a high blood glucose level.  Severe  nausea.  Vomiting.  Diarrhea.  Abdominal pain.  Rapid breathing.  Drink extra liquids that do not contain sugar such as water.  Be careful with over-the-counter medicines. Read the labels. They may contain sugar or types of sugars that can increase your blood glucose level. Food Choices for Illness All of the food choices below contain about 15 g of carbohydrates. Plan ahead and keep some of these foods around.    to  cup carbonated beverage containing sugar. Carbonated beverages will usually be better tolerated if they are opened and left at room temperature for a few minutes.   of a twin frozen ice pop.   cup regular gelatin.   cup juice.   cup ice cream or frozen yogurt.   cup cooked cereal.   cup sherbet.  1 cup clear broth or soup.  1 cup cream soup.   cup regular custard.   cup regular pudding.  1 cup sports drink.  1 cup plain yogurt.  1 slice toast.  6 squares saltine crackers.  5 vanilla wafers. SEEK MEDICAL CARE IF:   You are unable to drink fluids, even small amounts.  You have nausea and vomiting for more than 6 hours.  You have diarrhea for more than 6 hours.  Your blood glucose level is more than 240 mg/dL, even with additional insulin.  There is a change in mental status.  You develop an additional serious sickness.  You have been sick for 2 days and are not getting better.  You have a fever. SEEK IMMEDIATE  MEDICAL CARE IF:  You have difficulty breathing.  You have moderate to large ketone levels. MAKE SURE YOU:  Understand these instructions.  Will watch your condition.  Will get help right away if you are not doing well or get worse. Document Released: 06/03/2003 Document Revised: 10/15/2013 Document Reviewed: 11/07/2012 [facility] Patient Information 2015 [place], [place]. This information is not intended to replace advice given to you by your health care provider. Make sure you discuss any questions you have with  your health care provider.

## 2014-04-30 LAB — MICROALBUMIN / CREATININE URINE RATIO
CREATININE, U: 180.2 mg/dL
MICROALB UR: 1 mg/dL (ref 0.0–1.9)
MICROALB/CREAT RATIO: 0.6 mg/g (ref 0.0–30.0)

## 2014-05-02 ENCOUNTER — Encounter: Payer: Self-pay | Admitting: Family Medicine

## 2014-08-15 ENCOUNTER — Encounter: Payer: Self-pay | Admitting: Internal Medicine

## 2014-10-28 ENCOUNTER — Ambulatory Visit (INDEPENDENT_AMBULATORY_CARE_PROVIDER_SITE_OTHER): Payer: BC Managed Care – PPO | Admitting: Family Medicine

## 2014-10-28 ENCOUNTER — Encounter: Payer: Self-pay | Admitting: Family Medicine

## 2014-10-28 VITALS — BP 130/82 | HR 99 | Temp 99.7°F | Resp 18 | Ht 71.75 in | Wt 367.0 lb

## 2014-10-28 DIAGNOSIS — R05 Cough: Secondary | ICD-10-CM | POA: Diagnosis not present

## 2014-10-28 DIAGNOSIS — J0110 Acute frontal sinusitis, unspecified: Secondary | ICD-10-CM | POA: Diagnosis not present

## 2014-10-28 DIAGNOSIS — J019 Acute sinusitis, unspecified: Secondary | ICD-10-CM | POA: Insufficient documentation

## 2014-10-28 DIAGNOSIS — E119 Type 2 diabetes mellitus without complications: Secondary | ICD-10-CM

## 2014-10-28 DIAGNOSIS — R059 Cough, unspecified: Secondary | ICD-10-CM

## 2014-10-28 LAB — LIPID PANEL
Cholesterol: 179 mg/dL (ref 0–200)
HDL: 55.4 mg/dL (ref >39.00)
LDL CALC: 106 mg/dL — AB (ref 0–99)
NonHDL: 123.6
TRIGLYCERIDES: 89 mg/dL (ref 0.0–149.0)
Total CHOL/HDL Ratio: 3
VLDL: 17.8 mg/dL (ref 0.0–40.0)

## 2014-10-28 LAB — HEPATIC FUNCTION PANEL
ALK PHOS: 56 U/L (ref 39–117)
ALT: 17 U/L (ref 0–53)
AST: 18 U/L (ref 0–37)
Albumin: 3.8 g/dL (ref 3.5–5.2)
BILIRUBIN TOTAL: 0.6 mg/dL (ref 0.2–1.2)
Bilirubin, Direct: 0.1 mg/dL (ref 0.0–0.3)
TOTAL PROTEIN: 7.4 g/dL (ref 6.0–8.3)

## 2014-10-28 LAB — HEMOGLOBIN A1C: Hgb A1c MFr Bld: 6.9 % — ABNORMAL HIGH (ref 4.6–6.5)

## 2014-10-28 LAB — BASIC METABOLIC PANEL
BUN: 9 mg/dL (ref 6–23)
CHLORIDE: 100 meq/L (ref 96–112)
CO2: 31 meq/L (ref 19–32)
Calcium: 9.3 mg/dL (ref 8.4–10.5)
Creatinine, Ser: 0.96 mg/dL (ref 0.40–1.50)
GFR: 102.14 mL/min (ref >60.00)
GLUCOSE: 131 mg/dL — AB (ref 70–99)
POTASSIUM: 4.4 meq/L (ref 3.5–5.1)
Sodium: 134 mEq/L — ABNORMAL LOW (ref 135–145)

## 2014-10-28 MED ORDER — GLUCOSE BLOOD VI STRP: ORAL_STRIP | Status: DC

## 2014-10-28 MED ORDER — AMOXICILLIN-POT CLAVULANATE 875-125 MG PO TABS: 1.0000 | ORAL_TABLET | Freq: Two times a day (BID) | ORAL | Status: DC

## 2014-10-28 MED ORDER — INSULIN PEN NEEDLE 32G X 6 MM MISC: 1.0000 "pen " | Freq: Every day | Status: DC

## 2014-10-28 MED ORDER — GUAIFENESIN-CODEINE 100-10 MG/5ML PO SYRP: ORAL_SOLUTION | ORAL | Status: DC

## 2014-10-28 MED ORDER — METFORMIN HCL ER 500 MG PO TB24: ORAL_TABLET | ORAL | Status: DC

## 2014-10-28 NOTE — Assessment & Plan Note (Signed)
See orders for abx con't cough meds prn And flonase rto prn

## 2014-10-28 NOTE — Patient Instructions (Signed)

## 2014-10-28 NOTE — Progress Notes (Signed)
Patient ID: Richard [name], male    DOB: Nov 24, 1950  Age: 64 y.o. MRN: [id number]    Subjective:  Subjective HPI Richard [name] presents for f/u dm and c/o sinus congestion.  He took advil and flonase with little relief.  Mucous is now green.  He took 1 dose of mucinex with no relief.    Review of Systems  Constitutional: Negative for fever and chills.  HENT: Positive for congestion, postnasal drip, rhinorrhea, sinus pressure and sore throat. Negative for ear discharge and ear pain.   Respiratory: Positive for cough, chest tightness, shortness of breath and wheezing.   Cardiovascular: Negative for chest pain, palpitations and leg swelling.  Allergic/Immunologic: Negative for environmental allergies.    History Past Medical History  Diagnosis Date  . Hyperlipidemia   . Diabetes mellitus     He has no past surgical history on file.   His family history includes Diabetes in his brother, mother, and sister; Hypertension in his sister; Stroke in his sister.He reports that he has never smoked. He has never used smokeless tobacco. He reports that he drinks alcohol. He reports that he does not use illicit drugs.  Current Outpatient Prescriptions on File Prior to Visit  Medication Sig Dispense Refill  . atorvastatin (LIPITOR) 40 MG tablet Take 1 tablet (40 mg total) by mouth at bedtime. 90 tablet 1  . fexofenadine (ALLEGRA) 180 MG tablet Take 180 mg by mouth daily.    . fluticasone (FLONASE) 50 MCG/ACT nasal spray Place 2 sprays into both nostrils daily. 16 g 5  . HYDROcodone-acetaminophen (NORCO) 10-325 MG per tablet Take 1 tablet by mouth every 8 (eight) hours as needed. 30 tablet 0  . ibuprofen (ADVIL,MOTRIN) 100 MG tablet Take 100 mg by mouth every 6 (six) hours as needed for fever.    Marland Kitchen levofloxacin (LEVAQUIN) 500 MG tablet Take 1 tablet (500 mg total) by mouth daily. 10 tablet 0  . Liraglutide (VICTOZA) 18 MG/3ML SOPN Inject 0.3 mLs into the skin daily. 9 mL 5   No current  facility-administered medications on file prior to visit.     Objective:  Objective Physical Exam  Constitutional: He is oriented to person, place, and time. Vital signs are normal. He appears well-developed and well-nourished. He is sleeping.  HENT:  Head: Normocephalic and atraumatic.  Right Ear: External ear normal.  Left Ear: External ear normal.  Mouth/Throat: Oropharynx is clear and moist.  + PND + errythema  Eyes: Conjunctivae and EOM are normal. Pupils are equal, round, and reactive to light. Right eye exhibits no discharge. Left eye exhibits no discharge.  Neck: Normal range of motion. Neck supple. No thyromegaly present.  Cardiovascular: Normal rate, regular rhythm and normal heart sounds.   No murmur heard. Pulmonary/Chest: Effort normal and breath sounds normal. No respiratory distress. He has no wheezes. He has no rales. He exhibits no tenderness.  Musculoskeletal: He exhibits no edema or tenderness.  Lymphadenopathy:    He has cervical adenopathy.  Neurological: He is alert and oriented to person, place, and time.  Skin: Skin is warm and dry.  Psychiatric: He has a normal mood and affect. His behavior is normal. Judgment and thought content normal.   BP 130/82 mmHg  Pulse 99  Temp(Src) 99.7 F (37.6 C) (Oral)  Resp 18  Ht 5' 11.75" (1.822 m)  Wt 367 lb (166.47 kg)  BMI 50.15 kg/m2  SpO2 96% Wt Readings from Last 3 Encounters:  10/28/14 367 lb (166.47 kg)  04/29/14 386 lb 12.8  oz (175.451 kg)  10/12/13 367 lb (166.47 kg)     Lab Results  Component Value Date   WBC 7.0 02/06/2013   HGB 15.4 02/06/2013   HCT 45.3 02/06/2013   PLT 234.0 02/06/2013   GLUCOSE 108* 04/29/2014   CHOL 125 04/29/2014   TRIG 116.0 04/29/2014   HDL 41.30 04/29/2014   LDLDIRECT 148.7 03/17/2010   LDLCALC 61 04/29/2014   ALT 16 04/29/2014   AST 19 04/29/2014   NA 139 04/29/2014   K 4.1 04/29/2014   CL 104 04/29/2014   CREATININE 0.8 04/29/2014   BUN 13 04/29/2014   CO2 24  04/29/2014   TSH 0.75 01/27/2009   PSA 0.29 01/27/2009   HGBA1C 6.7* 04/29/2014   MICROALBUR 1.0 04/29/2014    Dg Chest 2 View  09/12/2013   CLINICAL DATA:  Cough, congestion, chest pain and fatigue  EXAM: CHEST  2 VIEW  COMPARISON:  Chest x-ray of 06/04/2010  FINDINGS: No active infiltrate or effusion is seen. There is mild peribronchial thickening which may indicate bronchitis. The heart is within normal limits in size. There are degenerative changes throughout the thoracic spine.  IMPRESSION: No pneumonia.  Peribronchial thickening may indicate bronchitis.   Electronically Signed   By: Ivar Drape M.D.   On: 09/12/2013 13:03     Assessment & Plan:  Plan I am having Richard [name] start on amoxicillin-clavulanate. I am also having him maintain his fexofenadine, ibuprofen, levofloxacin, atorvastatin, fluticasone, HYDROcodone-acetaminophen, Liraglutide, glucose blood, guaiFENesin-codeine, Insulin Pen Needle, and metFORMIN.  Meds ordered this encounter  Medications  . glucose blood (ONE TOUCH ULTRA TEST) test strip    Sig: Check blood sugar once daily    Dispense:  100 each    Refill:  5  . guaiFENesin-codeine (ROBITUSSIN AC) 100-10 MG/5ML syrup    Sig: 1-2 tsp po qhs prn cough    Dispense:  120 mL    Refill:  0  . Insulin Pen Needle (NOVOFINE) 32G X 6 MM MISC    Sig: Inject 1 pen into the skin daily.    Dispense:  100 each    Refill:  5  . metFORMIN (GLUCOPHAGE-XR) 500 MG 24 hr tablet    Sig: TAKE 2 TABLETS BY MOUTH TWICE DAILY    Dispense:  360 tablet    Refill:  1  . amoxicillin-clavulanate (AUGMENTIN) 875-125 MG per tablet    Sig: Take 1 tablet by mouth 2 (two) times daily.    Dispense:  20 tablet    Refill:  0    Problem List Items Addressed This Visit    Acute sinusitis    See orders for abx con't cough meds prn And flonase rto prn      Relevant Medications   guaiFENesin-codeine (ROBITUSSIN AC) 100-10 MG/5ML syrup   amoxicillin-clavulanate (AUGMENTIN) 875-125 MG per  tablet    Other Visit Diagnoses    Diabetes type 2, controlled    -  Primary    Relevant Medications    glucose blood (ONE TOUCH ULTRA TEST) test strip    Insulin Pen Needle (NOVOFINE) 32G X 6 MM MISC    metFORMIN (GLUCOPHAGE-XR) 500 MG 24 hr tablet    Other Relevant Orders    Lipid Profile    Hepatic function panel    HgB Y4I    Basic metabolic panel    Cough        Relevant Medications    guaiFENesin-codeine (ROBITUSSIN AC) 100-10 MG/5ML syrup  Follow-up: Return in about 6 months (around 04/30/2015), or if symptoms worsen or fail to improve, for f/u and labs.  Garnet Koyanagi, DO

## 2014-10-28 NOTE — Progress Notes (Signed)
Pre visit review using our clinic review tool, if applicable. No additional management support is needed unless otherwise documented below in the visit note. 

## 2014-11-08 ENCOUNTER — Ambulatory Visit (INDEPENDENT_AMBULATORY_CARE_PROVIDER_SITE_OTHER): Payer: BC Managed Care – PPO | Admitting: Medical

## 2014-11-08 ENCOUNTER — Encounter: Payer: Self-pay | Admitting: Medical

## 2014-11-08 ENCOUNTER — Ambulatory Visit: Payer: BC Managed Care – PPO | Admitting: Internal Medicine

## 2014-11-08 VITALS — BP 141/89 | HR 80 | Temp 98.0°F | Ht 71.75 in | Wt 368.0 lb

## 2014-11-08 DIAGNOSIS — J0100 Acute maxillary sinusitis, unspecified: Secondary | ICD-10-CM | POA: Diagnosis not present

## 2014-11-08 DIAGNOSIS — R062 Wheezing: Secondary | ICD-10-CM | POA: Diagnosis not present

## 2014-11-08 DIAGNOSIS — R059 Cough, unspecified: Secondary | ICD-10-CM

## 2014-11-08 DIAGNOSIS — J329 Chronic sinusitis, unspecified: Secondary | ICD-10-CM | POA: Insufficient documentation

## 2014-11-08 DIAGNOSIS — R05 Cough: Secondary | ICD-10-CM

## 2014-11-08 MED ORDER — BENZONATATE 100 MG PO CAPS
100.0000 mg | ORAL_CAPSULE | Freq: Three times a day (TID) | ORAL | Status: DC | PRN
Start: 2014-11-08 — End: 2015-04-08

## 2014-11-08 MED ORDER — AZITHROMYCIN 250 MG PO TABS: ORAL_TABLET | ORAL | Status: DC

## 2014-11-08 MED ORDER — ALBUTEROL SULFATE HFA 108 (90 BASE) MCG/ACT IN AERS: 2.0000 | INHALATION_SPRAY | Freq: Four times a day (QID) | RESPIRATORY_TRACT | Status: DC | PRN

## 2014-11-08 NOTE — Progress Notes (Signed)
Pre visit review using our clinic review tool, if applicable. No additional management support is needed unless otherwise documented below in the visit note. 

## 2014-11-08 NOTE — Progress Notes (Signed)
   Subjective:    Patient ID: Richard [name], male    DOB: 25-Mar-1951, 64 y.o.   MRN: [id number]  HPI  Pt in with some sinus pressure for 12 days. Pt also feels some thick saliva on back of throat. Pt is diabetic. No fevers, no chills or sweats.   Pt has been on antibiotic augmentin on 16 th. His mucous no longer green but clear but still has sinus pressure.   No pain on throat.   Pt a1-c was 6.9 recently 1 wk ago  and each am bs 110 or so each day.  Pt is on fluticasone.  Pt is coughing a lot at night. No history of asthma. But some occasional wheeze very brief and only for seconds.   Review of Systems  Constitutional: Negative for fever, chills and fatigue.  HENT: Positive for congestion, postnasal drip and sinus pressure.        Feels like needs to clear mucous from throat.   Respiratory: Positive for cough. Negative for chest tightness, shortness of breath and wheezing.   Cardiovascular: Negative for chest pain and palpitations.  Musculoskeletal: Negative for back pain.  Neurological: Negative for dizziness, seizures, syncope, facial asymmetry, weakness, numbness and headaches.  Hematological: Negative for adenopathy. Does not bruise/bleed easily.       Objective:   Physical Exam  General  Mental Status - Alert. General Appearance - Well groomed. Not in acute distress.  Skin Rashes- No Rashes.  HEENT Head- Normal. Ear Auditory Canal - Left- Normal. Right - Normal.Tympanic Membrane- Left- Normal. Right- Normal. Eye Sclera/Conjunctiva- Left- Normal. Right- Normal. Nose & Sinuses Nasal Mucosa- Left-  Boggy and Congested. Right-  Boggy and  Congested.Bilateral no maxillary and no  frontal sinus pressure. Mouth & Throat Lips: Upper Lip- Normal: no dryness, cracking, pallor, cyanosis, or vesicular eruption. Lower Lip-Normal: no dryness, cracking, pallor, cyanosis or vesicular eruption. Buccal Mucosa- Bilateral- No Aphthous ulcers. Oropharynx- No Discharge or Erythema.  +pnd Tonsils: Characteristics- Bilateral- No Erythema or Congestion. Size/Enlargement- Bilateral- No enlargement. Discharge- bilateral-None.  Neck Neck- Supple. No Masses.   Chest and Lung Exam Auscultation: Breath Sounds:-Clear even and unlabored.  Cardiovascular Auscultation:Rythm- Regular, rate and rhythm. Murmurs & Other Heart Sounds:Ausculatation of the heart reveal- No Murmurs.  Lymphatic Head & Neck General Head & Neck Lymphatics: Bilateral: Description- No Localized lymphadenopathy.       Assessment & Plan:  Pt did agree to see me although he told staff he wanted to MD. Discussed possible use of steroid today. If symptoms persist would give regular insulin pen with steroid.

## 2014-11-08 NOTE — Assessment & Plan Note (Signed)
Some persisting sinus complaints. Will rx azithromycin. For your cough rx benzonatate. Stop robitussin AC.  For any wheezing. Making albuterol available.  Follow up in 7 days or as needed.

## 2014-11-08 NOTE — Patient Instructions (Signed)
Sinusitis, acute Some persisting sinus complaints. Will rx azithromycin. For your cough rx benzonatate. Stop robitussin AC.  For any wheezing. Making albuterol available.  Follow up in 7 days or as needed.

## 2015-02-21 ENCOUNTER — Other Ambulatory Visit: Payer: Self-pay | Admitting: Family Medicine

## 2015-04-08 ENCOUNTER — Ambulatory Visit (INDEPENDENT_AMBULATORY_CARE_PROVIDER_SITE_OTHER): Payer: BC Managed Care – PPO | Admitting: Family Medicine

## 2015-04-08 ENCOUNTER — Encounter: Payer: Self-pay | Admitting: Family Medicine

## 2015-04-08 VITALS — BP 158/87 | HR 87 | Temp 98.3°F | Wt 377.0 lb

## 2015-04-08 DIAGNOSIS — J019 Acute sinusitis, unspecified: Secondary | ICD-10-CM

## 2015-04-08 DIAGNOSIS — R05 Cough: Secondary | ICD-10-CM

## 2015-04-08 DIAGNOSIS — R059 Cough, unspecified: Secondary | ICD-10-CM

## 2015-04-08 DIAGNOSIS — K047 Periapical abscess without sinus: Secondary | ICD-10-CM

## 2015-04-08 DIAGNOSIS — E1151 Type 2 diabetes mellitus with diabetic peripheral angiopathy without gangrene: Secondary | ICD-10-CM

## 2015-04-08 DIAGNOSIS — E785 Hyperlipidemia, unspecified: Secondary | ICD-10-CM

## 2015-04-08 DIAGNOSIS — IMO0002 Reserved for concepts with insufficient information to code with codable children: Secondary | ICD-10-CM

## 2015-04-08 DIAGNOSIS — M62830 Muscle spasm of back: Secondary | ICD-10-CM

## 2015-04-08 DIAGNOSIS — I10 Essential (primary) hypertension: Secondary | ICD-10-CM

## 2015-04-08 DIAGNOSIS — E1165 Type 2 diabetes mellitus with hyperglycemia: Secondary | ICD-10-CM

## 2015-04-08 MED ORDER — HYDROCODONE-ACETAMINOPHEN 10-325 MG PO TABS: 1.0000 | ORAL_TABLET | Freq: Three times a day (TID) | ORAL | Status: DC | PRN

## 2015-04-08 MED ORDER — INSULIN PEN NEEDLE 32G X 6 MM MISC: 1.0000 "pen " | Freq: Every day | Status: DC

## 2015-04-08 MED ORDER — LIRAGLUTIDE 18 MG/3ML ~~LOC~~ SOPN: 0.3000 mL | PEN_INJECTOR | Freq: Every day | SUBCUTANEOUS | Status: DC

## 2015-04-08 MED ORDER — METFORMIN HCL ER 500 MG PO TB24: ORAL_TABLET | ORAL | Status: DC

## 2015-04-08 MED ORDER — GUAIFENESIN-CODEINE 100-10 MG/5ML PO SYRP: ORAL_SOLUTION | ORAL | Status: DC

## 2015-04-08 MED ORDER — CYCLOBENZAPRINE HCL 10 MG PO TABS: 10.0000 mg | ORAL_TABLET | Freq: Three times a day (TID) | ORAL | Status: DC | PRN

## 2015-04-08 MED ORDER — FLUTICASONE PROPIONATE 50 MCG/ACT NA SUSP: 2.0000 | Freq: Every day | NASAL | Status: DC

## 2015-04-08 MED ORDER — BENZONATATE 100 MG PO CAPS: 100.0000 mg | ORAL_CAPSULE | Freq: Three times a day (TID) | ORAL | Status: DC | PRN

## 2015-04-08 MED ORDER — AMOXICILLIN-POT CLAVULANATE 875-125 MG PO TABS: 1.0000 | ORAL_TABLET | Freq: Two times a day (BID) | ORAL | Status: DC

## 2015-04-08 NOTE — Patient Instructions (Signed)

## 2015-04-08 NOTE — Progress Notes (Signed)
Pre visit review using our clinic review tool, if applicable. No additional management support is needed unless otherwise documented below in the visit note. 

## 2015-04-08 NOTE — Progress Notes (Signed)
Patient ID: Richard [name], male    DOB: 08-07-1950  Age: 64 y.o. MRN: [id number]    Subjective:  Subjective HPI Richard [name] presents for uri symptoms since Saturday.  She has been taking mucinex.  The cough med with codeine.    Review of Systems  Constitutional: Positive for chills. Negative for fever, diaphoresis, appetite change, fatigue and unexpected weight change.  HENT: Positive for congestion, postnasal drip, rhinorrhea and sinus pressure. Negative for sore throat.   Eyes: Negative for pain, redness and visual disturbance.  Respiratory: Positive for cough. Negative for chest tightness, shortness of breath and wheezing.   Cardiovascular: Negative for chest pain, palpitations and leg swelling.  Endocrine: Negative for cold intolerance, heat intolerance, polydipsia, polyphagia and polyuria.  Genitourinary: Negative for dysuria, frequency and difficulty urinating.  Musculoskeletal: Positive for back pain, arthralgias and neck stiffness. Negative for myalgias, joint swelling, gait problem and neck pain.  Skin: Negative for color change and rash.  Allergic/Immunologic: Negative for environmental allergies.  Neurological: Negative for dizziness, light-headedness, numbness and headaches.  Psychiatric/Behavioral: Negative for behavioral problems, dysphoric mood and decreased concentration. The patient is not nervous/anxious.     History Past Medical History  Diagnosis Date  . Hyperlipidemia   . Diabetes mellitus     He has no past surgical history on file.   His family history includes Diabetes in his brother, mother, and sister; Hypertension in his sister; Stroke in his sister.He reports that he has never smoked. He has never used smokeless tobacco. He reports that he drinks alcohol. He reports that he does not use illicit drugs.  Current Outpatient Prescriptions on File Prior to Visit  Medication Sig Dispense Refill  . albuterol (PROVENTIL HFA;VENTOLIN HFA) 108 (90 BASE)  MCG/ACT inhaler Inhale 2 puffs into the lungs every 6 (six) hours as needed for wheezing or shortness of breath. 1 Inhaler 0  . atorvastatin (LIPITOR) 40 MG tablet Take 1 tablet (40 mg total) by mouth at bedtime. 90 tablet 1  . fexofenadine (ALLEGRA) 180 MG tablet Take 180 mg by mouth daily.    Marland Kitchen glucose blood (ONE TOUCH ULTRA TEST) test strip Check blood sugar once daily 100 each 5  . ibuprofen (ADVIL,MOTRIN) 100 MG tablet Take 100 mg by mouth every 6 (six) hours as needed for fever.     No current facility-administered medications on file prior to visit.     Objective:  Objective Physical Exam  Constitutional: He is oriented to person, place, and time. He appears well-developed and well-nourished.  HENT:  Right Ear: Hearing, tympanic membrane, external ear and ear canal normal.  Left Ear: Hearing, tympanic membrane, external ear and ear canal normal.  Nose: Right sinus exhibits maxillary sinus tenderness and frontal sinus tenderness. Left sinus exhibits maxillary sinus tenderness and frontal sinus tenderness.  Mouth/Throat: Posterior oropharyngeal edema and posterior oropharyngeal erythema present.  + PND + errythema  Eyes: Conjunctivae are normal. Right eye exhibits no discharge. Left eye exhibits no discharge.  Cardiovascular: Normal rate, regular rhythm and normal heart sounds.   No murmur heard. Pulmonary/Chest: Effort normal and breath sounds normal. No respiratory distress. He has no wheezes. He has no rales. He exhibits no tenderness.  Abdominal: Soft. Bowel sounds are normal. He exhibits no distension and no mass. There is no tenderness. There is no rebound and no guarding.  Musculoskeletal: He exhibits no edema.  Lymphadenopathy:    He has cervical adenopathy.  Neurological: He is alert and oriented to person, place, and time.  Psychiatric:  He has a normal mood and affect. His behavior is normal. Judgment and thought content normal.  Nursing note and vitals reviewed.  BP  158/87 mmHg  Pulse 87  Temp(Src) 98.3 F (36.8 C) (Oral)  Wt 377 lb (171.006 kg)  SpO2 96% Wt Readings from Last 3 Encounters:  04/08/15 377 lb (171.006 kg)  11/08/14 368 lb (166.924 kg)  10/28/14 367 lb (166.47 kg)     Lab Results  Component Value Date   WBC 7.0 02/06/2013   HGB 15.4 02/06/2013   HCT 45.3 02/06/2013   PLT 234.0 02/06/2013   GLUCOSE 131* 10/28/2014   CHOL 179 10/28/2014   TRIG 89.0 10/28/2014   HDL 55.40 10/28/2014   LDLDIRECT 148.7 03/17/2010   LDLCALC 106* 10/28/2014   ALT 17 10/28/2014   AST 18 10/28/2014   NA 134* 10/28/2014   K 4.4 10/28/2014   CL 100 10/28/2014   CREATININE 0.96 10/28/2014   BUN 9 10/28/2014   CO2 31 10/28/2014   TSH 0.75 01/27/2009   PSA 0.29 01/27/2009   HGBA1C 6.9* 10/28/2014   MICROALBUR 1.0 04/29/2014    Dg Chest 2 View  09/12/2013  CLINICAL DATA:  Cough, congestion, chest pain and fatigue EXAM: CHEST  2 VIEW COMPARISON:  Chest x-ray of 06/04/2010 FINDINGS: No active infiltrate or effusion is seen. There is mild peribronchial thickening which may indicate bronchitis. The heart is within normal limits in size. There are degenerative changes throughout the thoracic spine. IMPRESSION: No pneumonia.  Peribronchial thickening may indicate bronchitis. Electronically Signed   By: Ivar Drape M.D.   On: 09/12/2013 13:03     Assessment & Plan:  Plan I have discontinued Richard [name] azithromycin and NOVOFINE. I have also changed his HYDROcodone-acetaminophen and Liraglutide. Additionally, I am having him start on amoxicillin-clavulanate and cyclobenzaprine. Lastly, I am having him maintain his fexofenadine, ibuprofen, atorvastatin, glucose blood, albuterol, benzonatate, fluticasone, guaiFENesin-codeine, Insulin Pen Needle, and metFORMIN.  Meds ordered this encounter  Medications  . benzonatate (TESSALON) 100 MG capsule    Sig: Take 1 capsule (100 mg total) by mouth 3 (three) times daily as needed for cough.    Dispense:  30 capsule     Refill:  0  . fluticasone (FLONASE) 50 MCG/ACT nasal spray    Sig: Place 2 sprays into both nostrils daily.    Dispense:  16 g    Refill:  5  . guaiFENesin-codeine (ROBITUSSIN AC) 100-10 MG/5ML syrup    Sig: 1-2 tsp po qhs prn cough    Dispense:  120 mL    Refill:  0  . HYDROcodone-acetaminophen (NORCO) 10-325 MG tablet    Sig: Take 1 tablet by mouth every 8 (eight) hours as needed.    Dispense:  30 tablet    Refill:  0  . Insulin Pen Needle (NOVOFINE) 32G X 6 MM MISC    Sig: Inject 1 pen into the skin daily.    Dispense:  100 each    Refill:  5  . Liraglutide (VICTOZA) 18 MG/3ML SOPN    Sig: Inject 0.3 mLs (1.8 mg total) into the skin daily.    Dispense:  9 mL    Refill:  5  . metFORMIN (GLUCOPHAGE-XR) 500 MG 24 hr tablet    Sig: TAKE 2 TABLETS BY MOUTH TWICE DAILY    Dispense:  360 tablet    Refill:  1  . amoxicillin-clavulanate (AUGMENTIN) 875-125 MG tablet    Sig: Take 1 tablet by mouth 2 (two) times daily.  Dispense:  20 tablet    Refill:  0  . cyclobenzaprine (FLEXERIL) 10 MG tablet    Sig: Take 1 tablet (10 mg total) by mouth 3 (three) times daily as needed for muscle spasms.    Dispense:  30 tablet    Refill:  0    Problem List Items Addressed This Visit    Sinusitis - Primary   Relevant Medications   benzonatate (TESSALON) 100 MG capsule   fluticasone (FLONASE) 50 MCG/ACT nasal spray   guaiFENesin-codeine (ROBITUSSIN AC) 100-10 MG/5ML syrup   HYDROcodone-acetaminophen (NORCO) 10-325 MG tablet   amoxicillin-clavulanate (AUGMENTIN) 875-125 MG tablet    Other Visit Diagnoses    Cough        Relevant Medications    guaiFENesin-codeine (ROBITUSSIN AC) 100-10 MG/5ML syrup    Tooth abscess        Relevant Medications    HYDROcodone-acetaminophen (NORCO) 10-325 MG tablet    DM (diabetes mellitus) type II uncontrolled, periph vascular disorder (HCC)        Relevant Medications    Insulin Pen Needle (NOVOFINE) 32G X 6 MM MISC    Liraglutide (VICTOZA) 18  MG/3ML SOPN    metFORMIN (GLUCOPHAGE-XR) 500 MG 24 hr tablet    Other Relevant Orders    Comp Met (CMET)    Hemoglobin A1c    Lipid panel    Microalbumin / creatinine urine ratio    POCT urinalysis dipstick    Essential hypertension        Relevant Orders    Comp Met (CMET)    Hemoglobin A1c    Lipid panel    Microalbumin / creatinine urine ratio    POCT urinalysis dipstick    Hyperlipidemia        Relevant Orders    Comp Met (CMET)    Hemoglobin A1c    Lipid panel    Microalbumin / creatinine urine ratio    POCT urinalysis dipstick    Muscle spasm of back        Relevant Medications    cyclobenzaprine (FLEXERIL) 10 MG tablet       Follow-up: Return if symptoms worsen or fail to improve.  Garnet Koyanagi, DO

## 2015-06-30 ENCOUNTER — Other Ambulatory Visit: Payer: Self-pay | Admitting: Family Medicine

## 2015-07-15 ENCOUNTER — Encounter: Payer: Self-pay | Admitting: Family Medicine

## 2015-07-15 ENCOUNTER — Ambulatory Visit (INDEPENDENT_AMBULATORY_CARE_PROVIDER_SITE_OTHER): Payer: BC Managed Care – PPO | Admitting: Family Medicine

## 2015-07-15 VITALS — BP 160/100 | HR 83 | Temp 99.1°F | Wt 385.0 lb

## 2015-07-15 DIAGNOSIS — R05 Cough: Secondary | ICD-10-CM | POA: Diagnosis not present

## 2015-07-15 DIAGNOSIS — E1151 Type 2 diabetes mellitus with diabetic peripheral angiopathy without gangrene: Secondary | ICD-10-CM

## 2015-07-15 DIAGNOSIS — IMO0002 Reserved for concepts with insufficient information to code with codable children: Secondary | ICD-10-CM

## 2015-07-15 DIAGNOSIS — K047 Periapical abscess without sinus: Secondary | ICD-10-CM

## 2015-07-15 DIAGNOSIS — E1165 Type 2 diabetes mellitus with hyperglycemia: Secondary | ICD-10-CM

## 2015-07-15 DIAGNOSIS — R059 Cough, unspecified: Secondary | ICD-10-CM

## 2015-07-15 DIAGNOSIS — J019 Acute sinusitis, unspecified: Secondary | ICD-10-CM

## 2015-07-15 DIAGNOSIS — Z1159 Encounter for screening for other viral diseases: Secondary | ICD-10-CM

## 2015-07-15 DIAGNOSIS — E785 Hyperlipidemia, unspecified: Secondary | ICD-10-CM | POA: Diagnosis not present

## 2015-07-15 LAB — COMPREHENSIVE METABOLIC PANEL
ALT: 16 U/L (ref 0–53)
AST: 16 U/L (ref 0–37)
Albumin: 4 g/dL (ref 3.5–5.2)
Alkaline Phosphatase: 64 U/L (ref 39–117)
BUN: 11 mg/dL (ref 6–23)
CALCIUM: 9.4 mg/dL (ref 8.4–10.5)
CHLORIDE: 101 meq/L (ref 96–112)
CO2: 30 meq/L (ref 19–32)
Creatinine, Ser: 0.86 mg/dL (ref 0.40–1.50)
GFR: 115.7 mL/min (ref >60.00)
GLUCOSE: 106 mg/dL — AB (ref 70–99)
POTASSIUM: 4.4 meq/L (ref 3.5–5.1)
Sodium: 137 mEq/L (ref 135–145)
Total Bilirubin: 0.5 mg/dL (ref 0.2–1.2)
Total Protein: 7.5 g/dL (ref 6.0–8.3)

## 2015-07-15 LAB — LIPID PANEL
CHOL/HDL RATIO: 3
Cholesterol: 135 mg/dL (ref 0–200)
HDL: 49.7 mg/dL (ref >39.00)
LDL CALC: 64 mg/dL (ref 0–99)
NONHDL: 85.49
Triglycerides: 105 mg/dL (ref 0.0–149.0)
VLDL: 21 mg/dL (ref 0.0–40.0)

## 2015-07-15 LAB — HEMOGLOBIN A1C: HEMOGLOBIN A1C: 7.1 % — AB (ref 4.6–6.5)

## 2015-07-15 MED ORDER — GUAIFENESIN-CODEINE 100-10 MG/5ML PO SYRP: ORAL_SOLUTION | ORAL | Status: DC

## 2015-07-15 MED ORDER — METFORMIN HCL ER 500 MG PO TB24: ORAL_TABLET | ORAL | Status: DC

## 2015-07-15 MED ORDER — HYDROCODONE-ACETAMINOPHEN 10-325 MG PO TABS: 1.0000 | ORAL_TABLET | Freq: Three times a day (TID) | ORAL | Status: DC | PRN

## 2015-07-15 MED ORDER — ATORVASTATIN CALCIUM 40 MG PO TABS: 40.0000 mg | ORAL_TABLET | Freq: Every day | ORAL | Status: DC

## 2015-07-15 MED ORDER — AMOXICILLIN-POT CLAVULANATE 875-125 MG PO TABS: 1.0000 | ORAL_TABLET | Freq: Two times a day (BID) | ORAL | Status: DC

## 2015-07-15 NOTE — Progress Notes (Signed)
  Subjective:     Richard [name] is a 65 y.o. male who presents for evaluation of sinus pain. Symptoms include: congestion, facial pain, headaches, nasal congestion, post nasal drip, sinus pressure and sore throat. Onset of symptoms was 3 days ago. Symptoms have been gradually worsening since that time. Past history is significant for no history of pneumonia or bronchitis. Patient is a non-smoker.  His wife had same symptoms first and has been sick over a week.    The following portions of the patient's history were reviewed and updated as appropriate: allergies, current medications, past family history, past medical history, past social history, past surgical history and problem list.  Review of Systems Pertinent items are noted in HPI.   Objective:    BP 160/100 mmHg  Pulse 83  Temp(Src) 99.1 F (37.3 C) (Oral)  Wt 385 lb (174.635 kg)  SpO2 98% General appearance: alert, cooperative, appears stated age and no distress Eyes: conjunctivae/corneas clear. PERRL, EOM's intact. Fundi benign. Ears: normal TM's and external ear canals both ears Nose: green discharge, mild congestion, turbinates red, swollen, sinus tenderness bilateral Throat: lips, mucosa, and tongue normal; teeth and gums normal Neck: mild anterior cervical adenopathy, supple, symmetrical, trachea midline and thyroid not enlarged, symmetric, no tenderness/mass/nodules Lungs: clear to auscultation bilaterally Heart: S1, S2 normal    Assessment:    Acute bacterial sinusitis.    Plan:    Nasal steroids per medication orders. Antihistamines per medication orders. Augmentin per medication orders.     1. Acute sinusitis, recurrence not specified, unspecified location  - HYDROcodone-acetaminophen (NORCO) 10-325 MG tablet; Take 1 tablet by mouth every 8 (eight) hours as needed.  Dispense: 30 tablet; Refill: 0 - amoxicillin-clavulanate (AUGMENTIN) 875-125 MG tablet; Take 1 tablet by mouth 2 (two) times daily.  Dispense: 20  tablet; Refill: 0  2. Tooth abscess  - HYDROcodone-acetaminophen (NORCO) 10-325 MG tablet; Take 1 tablet by mouth every 8 (eight) hours as needed.  Dispense: 30 tablet; Refill: 0  3. Cough  - guaiFENesin-codeine (ROBITUSSIN AC) 100-10 MG/5ML syrup; 1-2 tsp po qhs prn cough  Dispense: 120 mL; Refill: 0  4. DM (diabetes mellitus) type II uncontrolled, periph vascular disorder (HCC) Pt due for labs - metFORMIN (GLUCOPHAGE-XR) 500 MG 24 hr tablet; TAKE 2 TABLETS BY MOUTH TWICE DAILY  Dispense: 360 tablet; Refill: 1 - Hemoglobin A1c - Comp Met (CMET)  5. Hyperlipidemia Check labs - atorvastatin (LIPITOR) 40 MG tablet; Take 1 tablet (40 mg total) by mouth at bedtime. Repeat labs are due now  Dispense: 90 tablet; Refill: 0 - Lipid panel - Comp Met (CMET)  6. Need for hepatitis C screening test  - Hepatitis C antibody

## 2015-07-15 NOTE — Progress Notes (Signed)
Pre visit review using our clinic review tool, if applicable. No additional management support is needed unless otherwise documented below in the visit note. 

## 2015-07-15 NOTE — Patient Instructions (Signed)

## 2015-07-16 LAB — HEPATITIS C ANTIBODY: HCV Ab: NEGATIVE

## 2015-09-01 ENCOUNTER — Encounter: Payer: Self-pay | Admitting: Family Medicine

## 2015-09-01 ENCOUNTER — Ambulatory Visit (INDEPENDENT_AMBULATORY_CARE_PROVIDER_SITE_OTHER): Payer: BC Managed Care – PPO | Admitting: Family Medicine

## 2015-09-01 VITALS — BP 160/80 | HR 71 | Temp 98.4°F | Ht 72.0 in | Wt 377.0 lb

## 2015-09-01 DIAGNOSIS — K047 Periapical abscess without sinus: Secondary | ICD-10-CM | POA: Insufficient documentation

## 2015-09-01 DIAGNOSIS — R05 Cough: Secondary | ICD-10-CM

## 2015-09-01 DIAGNOSIS — J019 Acute sinusitis, unspecified: Secondary | ICD-10-CM | POA: Diagnosis not present

## 2015-09-01 DIAGNOSIS — H6691 Otitis media, unspecified, right ear: Secondary | ICD-10-CM | POA: Diagnosis not present

## 2015-09-01 DIAGNOSIS — R059 Cough, unspecified: Secondary | ICD-10-CM

## 2015-09-01 MED ORDER — CLOTRIMAZOLE-BETAMETHASONE 1-0.05 % EX CREA: TOPICAL_CREAM | Freq: Two times a day (BID) | CUTANEOUS | Status: DC

## 2015-09-01 MED ORDER — GUAIFENESIN-CODEINE 100-10 MG/5ML PO SYRP: ORAL_SOLUTION | ORAL | Status: DC

## 2015-09-01 MED ORDER — AMOXICILLIN-POT CLAVULANATE 875-125 MG PO TABS
1.0000 | ORAL_TABLET | Freq: Two times a day (BID) | ORAL | Status: DC
Start: 2015-09-01 — End: 2016-02-20

## 2015-09-01 MED ORDER — OXYCODONE-ACETAMINOPHEN 5-325 MG PO TABS: 1.0000 | ORAL_TABLET | Freq: Three times a day (TID) | ORAL | Status: DC | PRN

## 2015-09-01 NOTE — Progress Notes (Signed)
Pre visit review using our clinic review tool, if applicable. No additional management support is needed unless otherwise documented below in the visit note. 

## 2015-09-01 NOTE — Progress Notes (Signed)
Patient ID: Richard [name], male    DOB: [birth year]  Age: 65 y.o. MRN: [id number]    Subjective:  Subjective HPI Richard [name] presents for R sided facial pain, swollen glands.   Pain comes and goes but is severe.  Hydrocodone is not helping.  No fevers,  +congestion.     Review of Systems  Constitutional: Negative for diaphoresis, appetite change, fatigue and unexpected weight change.  Eyes: Negative for pain, redness and visual disturbance.  Respiratory: Negative for cough, chest tightness, shortness of breath and wheezing.   Cardiovascular: Negative for chest pain, palpitations and leg swelling.  Endocrine: Negative for cold intolerance, heat intolerance, polydipsia, polyphagia and polyuria.  Genitourinary: Negative for dysuria, frequency and difficulty urinating.  Neurological: Negative for dizziness, light-headedness, numbness and headaches.    History Past Medical History  Diagnosis Date  . Hyperlipidemia   . Diabetes mellitus     He has no past surgical history on file.   His family history includes Diabetes in his brother, mother, and sister; Hypertension in his sister; Stroke in his sister.He reports that he has never smoked. He has never used smokeless tobacco. He reports that he drinks alcohol. He reports that he does not use illicit drugs.  Current Outpatient Prescriptions on File Prior to Visit  Medication Sig Dispense Refill  . albuterol (PROVENTIL HFA;VENTOLIN HFA) 108 (90 BASE) MCG/ACT inhaler Inhale 2 puffs into the lungs every 6 (six) hours as needed for wheezing or shortness of breath. 1 Inhaler 0  . atorvastatin (LIPITOR) 40 MG tablet Take 1 tablet (40 mg total) by mouth at bedtime. Repeat labs are due now 90 tablet 0  . cyclobenzaprine (FLEXERIL) 10 MG tablet Take 1 tablet (10 mg total) by mouth 3 (three) times daily as needed for muscle spasms. 30 tablet 0  . fexofenadine (ALLEGRA) 180 MG tablet Take 180 mg by mouth daily.    . fluticasone (FLONASE) 50  MCG/ACT nasal spray Place 2 sprays into both nostrils daily. 16 g 5  . glucose blood (ONE TOUCH ULTRA TEST) test strip Check blood sugar once daily 100 each 5  . ibuprofen (ADVIL,MOTRIN) 100 MG tablet Take 100 mg by mouth every 6 (six) hours as needed for fever.    . Insulin Pen Needle (NOVOFINE) 32G X 6 MM MISC Inject 1 pen into the skin daily. 100 each 5  . Liraglutide (VICTOZA) 18 MG/3ML SOPN Inject 0.3 mLs (1.8 mg total) into the skin daily. 9 mL 5  . metFORMIN (GLUCOPHAGE-XR) 500 MG 24 hr tablet TAKE 2 TABLETS BY MOUTH TWICE DAILY 360 tablet 1   No current facility-administered medications on file prior to visit.     Objective:  Objective Physical Exam  Constitutional: He is oriented to person, place, and time. Vital signs are normal. He appears well-developed and well-nourished. He is sleeping.  HENT:  Head: Normocephalic and atraumatic.  Mouth/Throat: Oropharynx is clear and moist.  Eyes: EOM are normal. Pupils are equal, round, and reactive to light.  Neck: Normal range of motion. Neck supple. No thyromegaly present.  Cardiovascular: Normal rate and regular rhythm.   No murmur heard. Pulmonary/Chest: Effort normal and breath sounds normal. No respiratory distress. He has no wheezes. He has no rales. He exhibits no tenderness.  Musculoskeletal: He exhibits no edema or tenderness.  Neurological: He is alert and oriented to person, place, and time.  Skin: Skin is warm and dry.  Psychiatric: He has a normal mood and affect. His behavior is normal. Judgment and thought  content normal.  Nursing note and vitals reviewed.  BP 160/80 mmHg  Pulse 71  Temp(Src) 98.4 F (36.9 C) (Oral)  Ht 6' (1.829 m)  Wt 377 lb (171.006 kg)  BMI 51.12 kg/m2  SpO2 98% Wt Readings from Last 3 Encounters:  09/01/15 377 lb (171.006 kg)  07/15/15 385 lb (174.635 kg)  04/08/15 377 lb (171.006 kg)     Lab Results  Component Value Date   WBC 7.0 02/06/2013   HGB 15.4 02/06/2013   HCT 45.3  02/06/2013   PLT 234.0 02/06/2013   GLUCOSE 106* 07/15/2015   CHOL 135 07/15/2015   TRIG 105.0 07/15/2015   HDL 49.70 07/15/2015   LDLDIRECT 148.7 03/17/2010   LDLCALC 64 07/15/2015   ALT 16 07/15/2015   AST 16 07/15/2015   NA 137 07/15/2015   K 4.4 07/15/2015   CL 101 07/15/2015   CREATININE 0.86 07/15/2015   BUN 11 07/15/2015   CO2 30 07/15/2015   TSH 0.75 01/27/2009   PSA 0.29 01/27/2009   HGBA1C 7.1* 07/15/2015   MICROALBUR 1.0 04/29/2014    Dg Chest 2 View  09/12/2013  CLINICAL DATA:  Cough, congestion, chest pain and fatigue EXAM: CHEST  2 VIEW COMPARISON:  Chest x-ray of 06/04/2010 FINDINGS: No active infiltrate or effusion is seen. There is mild peribronchial thickening which may indicate bronchitis. The heart is within normal limits in size. There are degenerative changes throughout the thoracic spine. IMPRESSION: No pneumonia.  Peribronchial thickening may indicate bronchitis. Electronically Signed   By: Ivar Drape M.D.   On: 09/12/2013 13:03     Assessment & Plan:  Plan I have discontinued Mr. Crouse HYDROcodone-acetaminophen and amoxicillin-clavulanate. I am also having him start on amoxicillin-clavulanate and oxyCODONE-acetaminophen. Additionally, I am having him maintain his fexofenadine, ibuprofen, glucose blood, albuterol, fluticasone, Insulin Pen Needle, Liraglutide, cyclobenzaprine, atorvastatin, metFORMIN, guaiFENesin-codeine, and clotrimazole-betamethasone.  Meds ordered this encounter  Medications  . guaiFENesin-codeine (ROBITUSSIN AC) 100-10 MG/5ML syrup    Sig: 1-2 tsp po qhs prn cough    Dispense:  120 mL    Refill:  0  . clotrimazole-betamethasone (LOTRISONE) cream    Sig: Apply topically 2 (two) times daily.    Dispense:  45 g    Refill:  3  . amoxicillin-clavulanate (AUGMENTIN) 875-125 MG tablet    Sig: Take 1 tablet by mouth 2 (two) times daily.    Dispense:  20 tablet    Refill:  0  . oxyCODONE-acetaminophen (ROXICET) 5-325 MG tablet     Sig: Take 1-2 tablets by mouth every 8 (eight) hours as needed for severe pain.    Dispense:  30 tablet    Refill:  0    Problem List Items Addressed This Visit      Unprioritized   Sinusitis   Relevant Medications   guaiFENesin-codeine (ROBITUSSIN AC) 100-10 MG/5ML syrup   amoxicillin-clavulanate (AUGMENTIN) 875-125 MG tablet   Tooth abscess    May be source of pain If abx do not work consider ct  Pt has dentist app next month       Other Visit Diagnoses    Acute right otitis media, recurrence not specified, unspecified otitis media type    -  Primary    Relevant Medications    amoxicillin-clavulanate (AUGMENTIN) 875-125 MG tablet    oxyCODONE-acetaminophen (ROXICET) 5-325 MG tablet    Cough        Relevant Medications    guaiFENesin-codeine (ROBITUSSIN AC) 100-10 MG/5ML syrup  Follow-up: Return if symptoms worsen or fail to improve.  Garnet Koyanagi, DO

## 2015-09-01 NOTE — Assessment & Plan Note (Signed)
May be source of pain If abx do not work consider ct  Pt has dentist app next month

## 2015-09-01 NOTE — Patient Instructions (Signed)

## 2016-02-18 ENCOUNTER — Telehealth: Payer: Self-pay | Admitting: *Deleted

## 2016-02-18 NOTE — Telephone Encounter (Signed)
PA initiated on [email or web address] Awaiting response.//AB/CMA

## 2016-02-19 MED ORDER — PEN NEEDLES 31G X 5 MM MISC: 1.8000 mg | Freq: Every day | 5 refills | Status: DC

## 2016-02-19 NOTE — Telephone Encounter (Signed)
Received denial letter for the Novofine 2  32GX0.25 Dis Needle from [facility].   Will change pen needles to generic.  Phoned-in generic BD pen needles 31GX5MM-Inject 1 pen into the skin daily #100,5 refills to [facility] ([place]).//AB/CMA

## 2016-02-20 ENCOUNTER — Ambulatory Visit (INDEPENDENT_AMBULATORY_CARE_PROVIDER_SITE_OTHER): Payer: BC Managed Care – PPO | Admitting: Family Medicine

## 2016-02-20 ENCOUNTER — Encounter: Payer: Self-pay | Admitting: Family Medicine

## 2016-02-20 VITALS — BP 138/82 | HR 74 | Temp 97.8°F | Ht 72.0 in | Wt 366.8 lb

## 2016-02-20 DIAGNOSIS — H6691 Otitis media, unspecified, right ear: Secondary | ICD-10-CM

## 2016-02-20 DIAGNOSIS — R05 Cough: Secondary | ICD-10-CM | POA: Diagnosis not present

## 2016-02-20 DIAGNOSIS — Z86010 Personal history of colonic polyps: Secondary | ICD-10-CM | POA: Diagnosis not present

## 2016-02-20 DIAGNOSIS — IMO0002 Reserved for concepts with insufficient information to code with codable children: Secondary | ICD-10-CM

## 2016-02-20 DIAGNOSIS — E785 Hyperlipidemia, unspecified: Secondary | ICD-10-CM

## 2016-02-20 DIAGNOSIS — R059 Cough, unspecified: Secondary | ICD-10-CM

## 2016-02-20 DIAGNOSIS — E1151 Type 2 diabetes mellitus with diabetic peripheral angiopathy without gangrene: Secondary | ICD-10-CM | POA: Diagnosis not present

## 2016-02-20 DIAGNOSIS — M5489 Other dorsalgia: Secondary | ICD-10-CM

## 2016-02-20 DIAGNOSIS — M25562 Pain in left knee: Secondary | ICD-10-CM

## 2016-02-20 DIAGNOSIS — R109 Unspecified abdominal pain: Secondary | ICD-10-CM

## 2016-02-20 DIAGNOSIS — R195 Other fecal abnormalities: Secondary | ICD-10-CM

## 2016-02-20 DIAGNOSIS — E1165 Type 2 diabetes mellitus with hyperglycemia: Secondary | ICD-10-CM | POA: Diagnosis not present

## 2016-02-20 DIAGNOSIS — M545 Low back pain, unspecified: Secondary | ICD-10-CM

## 2016-02-20 DIAGNOSIS — I10 Essential (primary) hypertension: Secondary | ICD-10-CM | POA: Diagnosis not present

## 2016-02-20 LAB — COMPREHENSIVE METABOLIC PANEL
ALBUMIN: 4.1 g/dL (ref 3.5–5.2)
ALK PHOS: 53 U/L (ref 39–117)
ALT: 16 U/L (ref 0–53)
AST: 18 U/L (ref 0–37)
BILIRUBIN TOTAL: 0.5 mg/dL (ref 0.2–1.2)
BUN: 12 mg/dL (ref 6–23)
CO2: 28 mEq/L (ref 19–32)
CREATININE: 0.83 mg/dL (ref 0.40–1.50)
Calcium: 9 mg/dL (ref 8.4–10.5)
Chloride: 102 mEq/L (ref 96–112)
GFR: 120.31 mL/min (ref >60.00)
Glucose, Bld: 99 mg/dL (ref 70–99)
Potassium: 4 mEq/L (ref 3.5–5.1)
SODIUM: 136 meq/L (ref 135–145)
TOTAL PROTEIN: 7.6 g/dL (ref 6.0–8.3)

## 2016-02-20 LAB — LIPID PANEL
CHOLESTEROL: 182 mg/dL (ref 0–200)
HDL: 44 mg/dL (ref >39.00)
LDL CALC: 117 mg/dL — AB (ref 0–99)
NonHDL: 137.55
Total CHOL/HDL Ratio: 4
Triglycerides: 104 mg/dL (ref 0.0–149.0)
VLDL: 20.8 mg/dL (ref 0.0–40.0)

## 2016-02-20 LAB — POCT URINALYSIS DIPSTICK
Blood, UA: NEGATIVE
KETONES UA: NEGATIVE
LEUKOCYTES UA: NEGATIVE
NITRITE UA: NEGATIVE
PH UA: 6
Spec Grav, UA: 1.025
UROBILINOGEN UA: NEGATIVE

## 2016-02-20 LAB — HEMOGLOBIN A1C: HEMOGLOBIN A1C: 7.4 % — AB (ref 4.6–6.5)

## 2016-02-20 MED ORDER — ONETOUCH VERIO VI STRP: ORAL_STRIP | 12 refills | Status: DC

## 2016-02-20 MED ORDER — OXYCODONE-ACETAMINOPHEN 5-325 MG PO TABS: 1.0000 | ORAL_TABLET | Freq: Three times a day (TID) | ORAL | 0 refills | Status: DC | PRN

## 2016-02-20 MED ORDER — ATORVASTATIN CALCIUM 40 MG PO TABS: 40.0000 mg | ORAL_TABLET | Freq: Every day | ORAL | 0 refills | Status: DC

## 2016-02-20 MED ORDER — METFORMIN HCL ER 500 MG PO TB24: ORAL_TABLET | ORAL | 1 refills | Status: DC

## 2016-02-20 MED ORDER — PEN NEEDLES 31G X 5 MM MISC: 1.8000 mg | Freq: Every day | 5 refills | Status: DC

## 2016-02-20 MED ORDER — LIRAGLUTIDE 18 MG/3ML ~~LOC~~ SOPN: 1.8000 mg | PEN_INJECTOR | Freq: Every day | SUBCUTANEOUS | 5 refills | Status: DC

## 2016-02-20 MED ORDER — ONETOUCH ULTRA BLUE VI STRP: ORAL_STRIP | 5 refills | Status: DC

## 2016-02-20 MED ORDER — GUAIFENESIN-CODEINE 100-10 MG/5ML PO SYRP: ORAL_SOLUTION | ORAL | 0 refills | Status: DC

## 2016-02-20 NOTE — Progress Notes (Signed)
Pre visit review using our clinic review tool, if applicable. No additional management support is needed unless otherwise documented below in the visit note. 

## 2016-02-20 NOTE — Progress Notes (Signed)
Patient ID: Richard [name], male    DOB: [birth year]  Age: 65 y.o. MRN: [id number]    Subjective:  Subjective  HPI Richard [name] presents for f/u bp, cholesterol and dm Pt also c/o fatigue, loose stools daily 1-2 x a day.  No abd pain but he feels tight in the abd after eating.  He has noticed that eggs and milk affect him the most. He also c/o L knee pain medially--- he thinks he twisted it a few months ago   Review of Systems  Constitutional: Negative for appetite change, diaphoresis, fatigue and unexpected weight change.  Eyes: Negative for pain, redness and visual disturbance.  Respiratory: Negative for cough, chest tightness, shortness of breath and wheezing.   Cardiovascular: Negative for chest pain, palpitations and leg swelling.  Gastrointestinal: Negative for abdominal distention, abdominal pain, anal bleeding, blood in stool and constipation.       + loose stools and abd distention  Endocrine: Negative for cold intolerance, heat intolerance, polydipsia, polyphagia and polyuria.  Genitourinary: Negative for difficulty urinating, dysuria and frequency.  Neurological: Negative for dizziness, light-headedness, numbness and headaches.    History Past Medical History:  Diagnosis Date  . Diabetes mellitus   . Hyperlipidemia     He has no past surgical history on file.   His family history includes Diabetes in his brother, mother, and sister; Hypertension in his sister; Stroke in his sister.He reports that he has never smoked. He has never used smokeless tobacco. He reports that he drinks alcohol. He reports that he does not use drugs.  Current Outpatient Prescriptions on File Prior to Visit  Medication Sig Dispense Refill  . albuterol (PROVENTIL HFA;VENTOLIN HFA) 108 (90 BASE) MCG/ACT inhaler Inhale 2 puffs into the lungs every 6 (six) hours as needed for wheezing or shortness of breath. 1 Inhaler 0  . clotrimazole-betamethasone (LOTRISONE) cream Apply topically 2 (two) times  daily. 45 g 3  . cyclobenzaprine (FLEXERIL) 10 MG tablet Take 1 tablet (10 mg total) by mouth 3 (three) times daily as needed for muscle spasms. 30 tablet 0  . fexofenadine (ALLEGRA) 180 MG tablet Take 180 mg by mouth daily.    . fluticasone (FLONASE) 50 MCG/ACT nasal spray Place 2 sprays into both nostrils daily. 16 g 5  . ibuprofen (ADVIL,MOTRIN) 100 MG tablet Take 100 mg by mouth every 6 (six) hours as needed for fever.     No current facility-administered medications on file prior to visit.      Objective:  Objective  Physical Exam  Constitutional: He is oriented to person, place, and time. Vital signs are normal. He appears well-developed and well-nourished. He is sleeping.  HENT:  Head: Normocephalic and atraumatic.  Mouth/Throat: Oropharynx is clear and moist.  Eyes: EOM are normal. Pupils are equal, round, and reactive to light.  Neck: Normal range of motion. Neck supple. No thyromegaly present.  Cardiovascular: Normal rate and regular rhythm.   No murmur heard. Pulmonary/Chest: Effort normal and breath sounds normal. No respiratory distress. He has no wheezes. He has no rales. He exhibits no tenderness.  Abdominal: Soft. He exhibits no distension and no mass. There is no tenderness. There is no rebound and no guarding.  Musculoskeletal: He exhibits no edema or tenderness.  Neurological: He is alert and oriented to person, place, and time.  Skin: Skin is warm and dry.  Psychiatric: He has a normal mood and affect. His behavior is normal. Judgment and thought content normal.  Nursing note and vitals reviewed.  BP 138/82 (BP Location: Left Arm, Patient Position: Sitting, Cuff Size: Large)   Pulse 74   Temp 97.8 F (36.6 C) (Oral)   Ht 6' (1.829 m)   Wt (!) 366 lb 12.8 oz (166.4 kg)   SpO2 97%   BMI 49.75 kg/m  Wt Readings from Last 3 Encounters:  02/20/16 (!) 366 lb 12.8 oz (166.4 kg)  09/01/15 (!) 377 lb (171 kg)  07/15/15 (!) 385 lb (174.6 kg)     Lab Results    Component Value Date   WBC 7.0 02/06/2013   HGB 15.4 02/06/2013   HCT 45.3 02/06/2013   PLT 234.0 02/06/2013   GLUCOSE 99 02/20/2016   CHOL 182 02/20/2016   TRIG 104.0 02/20/2016   HDL 44.00 02/20/2016   LDLDIRECT 148.7 03/17/2010   LDLCALC 117 (H) 02/20/2016   ALT 16 02/20/2016   AST 18 02/20/2016   NA 136 02/20/2016   K 4.0 02/20/2016   CL 102 02/20/2016   CREATININE 0.83 02/20/2016   BUN 12 02/20/2016   CO2 28 02/20/2016   TSH 0.75 01/27/2009   PSA 0.29 01/27/2009   HGBA1C 7.4 (H) 02/20/2016   MICROALBUR 1.0 04/29/2014    Dg Chest 2 View  Result Date: 09/12/2013 CLINICAL DATA:  Cough, congestion, chest pain and fatigue EXAM: CHEST  2 VIEW COMPARISON:  Chest x-ray of 06/04/2010 FINDINGS: No active infiltrate or effusion is seen. There is mild peribronchial thickening which may indicate bronchitis. The heart is within normal limits in size. There are degenerative changes throughout the thoracic spine. IMPRESSION: No pneumonia.  Peribronchial thickening may indicate bronchitis. Electronically Signed   By: Ivar Drape M.D.   On: 09/12/2013 13:03     Assessment & Plan:  Plan  I have discontinued [name] amoxicillin-clavulanate. I have also changed his glucose blood to ONE TOUCH ULTRA TEST. Additionally, I am having him start on ONETOUCH VERIO. Lastly, I am having him maintain his fexofenadine, ibuprofen, albuterol, fluticasone, cyclobenzaprine, clotrimazole-betamethasone, metFORMIN, Liraglutide, Pen Needles, guaiFENesin-codeine, atorvastatin, and oxyCODONE-acetaminophen.  Meds ordered this encounter  Medications  . DISCONTD: oxyCODONE-acetaminophen (ROXICET) 5-325 MG tablet    Sig: Take 1-2 tablets by mouth every 8 (eight) hours as needed for severe pain.    Dispense:  30 tablet    Refill:  0  . metFORMIN (GLUCOPHAGE-XR) 500 MG 24 hr tablet    Sig: TAKE 2 TABLETS BY MOUTH TWICE DAILY    Dispense:  360 tablet    Refill:  1  . Liraglutide (VICTOZA) 18 MG/3ML SOPN     Sig: Inject 0.3 mLs (1.8 mg total) into the skin daily.    Dispense:  9 mL    Refill:  5  . Insulin Pen Needle (PEN NEEDLES) 31G X 5 MM MISC    Sig: Inject 1.8 mg into the skin daily. Use with Victoza    Dispense:  100 each    Refill:  5  . guaiFENesin-codeine (ROBITUSSIN AC) 100-10 MG/5ML syrup    Sig: 1-2 tsp po qhs prn cough    Dispense:  120 mL    Refill:  0  . atorvastatin (LIPITOR) 40 MG tablet    Sig: Take 1 tablet (40 mg total) by mouth at bedtime. Repeat labs are due now    Dispense:  90 tablet    Refill:  0  . ONE TOUCH ULTRA TEST test strip    Sig: Check blood sugar once daily    Dispense:  100 each    Refill:  5  . ONETOUCH VERIO test strip    Sig: Check your blood sugar daily    Dispense:  100 each    Refill:  12  . oxyCODONE-acetaminophen (ROXICET) 5-325 MG tablet    Sig: Take 1-2 tablets by mouth every 8 (eight) hours as needed for severe pain.    Dispense:  30 tablet    Refill:  0    Problem List Items Addressed This Visit      Unprioritized   DM (diabetes mellitus) type II uncontrolled, periph vascular disorder ([place])    con't met and victoza Check labs      Relevant Medications   metFORMIN (GLUCOPHAGE-XR) 500 MG 24 hr tablet   Liraglutide (VICTOZA) 18 MG/3ML SOPN   atorvastatin (LIPITOR) 40 MG tablet   ONE TOUCH ULTRA TEST test strip   ONETOUCH VERIO test strip   Other Relevant Orders   Comprehensive metabolic panel (Completed)   Hemoglobin A1c (Completed)   POCT urinalysis dipstick (Completed)    Other Visit Diagnoses    History of colonic polyps    -  Primary   Relevant Orders   Ambulatory referral to Gastroenterology   Acute right otitis media, recurrence not specified, unspecified otitis media type       Cough       Relevant Medications   guaiFENesin-codeine (ROBITUSSIN AC) 100-10 MG/5ML syrup   Hyperlipidemia       Relevant Medications   atorvastatin (LIPITOR) 40 MG tablet   Other Relevant Orders   Comprehensive metabolic panel  (Completed)   Lipid panel (Completed)   POCT urinalysis dipstick (Completed)   Abdominal pain in male       Relevant Orders   Ambulatory referral to Gastroenterology   Loose stools       Relevant Orders   Fecal occult blood, imunochemical   Back pain, lumbosacral       Relevant Medications   oxyCODONE-acetaminophen (ROXICET) 5-325 MG tablet   Essential hypertension       Relevant Medications   atorvastatin (LIPITOR) 40 MG tablet   Other Relevant Orders   Comprehensive metabolic panel (Completed)   POCT urinalysis dipstick (Completed)   Left knee pain       Relevant Orders   Ambulatory referral to Orthopedic Surgery      Follow-up: Return in about 6 months (around 08/19/2016) for hypertension, hyperlipidemia, diabetes II.  Ann Held, DO

## 2016-02-20 NOTE — Patient Instructions (Signed)

## 2016-02-22 NOTE — Assessment & Plan Note (Signed)
con't metformin and victoza Check labs 

## 2016-02-22 NOTE — Assessment & Plan Note (Signed)
con't met and victoza Check labs

## 2016-02-26 ENCOUNTER — Other Ambulatory Visit (INDEPENDENT_AMBULATORY_CARE_PROVIDER_SITE_OTHER): Payer: BC Managed Care – PPO

## 2016-02-26 DIAGNOSIS — R195 Other fecal abnormalities: Secondary | ICD-10-CM | POA: Diagnosis not present

## 2016-02-26 LAB — FECAL OCCULT BLOOD, IMMUNOCHEMICAL: Fecal Occult Bld: NEGATIVE

## 2016-03-23 ENCOUNTER — Encounter: Payer: Self-pay | Admitting: Internal Medicine

## 2016-05-11 ENCOUNTER — Ambulatory Visit (AMBULATORY_SURGERY_CENTER): Payer: Self-pay

## 2016-05-11 VITALS — Ht 72.0 in | Wt 362.0 lb

## 2016-05-11 DIAGNOSIS — Z860100 Personal history of colon polyps, unspecified: Secondary | ICD-10-CM

## 2016-05-11 NOTE — Progress Notes (Signed)
No allergies to eggs or soy No past problems with anesthesia No diet meds No home oxygen  Declined emmi 

## 2016-05-13 ENCOUNTER — Encounter: Payer: Self-pay | Admitting: Internal Medicine

## 2016-05-18 ENCOUNTER — Telehealth: Payer: Self-pay

## 2016-05-18 DIAGNOSIS — Z1211 Encounter for screening for malignant neoplasm of colon: Secondary | ICD-10-CM

## 2016-05-18 NOTE — Telephone Encounter (Signed)
Left message for patient to call me back we have had to cancel his colonoscopy for 05/25/16 in [facility] due to his weight, it's over the bed limit.  We will have to see about putting him on for Dr Celesta Aver hospital week in early January.  If that doesn't work Dr Ardis Hughs may be able to help Dr Carlean Purl with an earlier date.  We will have to check with him.

## 2016-05-19 ENCOUNTER — Other Ambulatory Visit: Payer: Self-pay | Admitting: Internal Medicine

## 2016-05-19 DIAGNOSIS — Z1211 Encounter for screening for malignant neoplasm of colon: Secondary | ICD-10-CM

## 2016-05-19 NOTE — Telephone Encounter (Signed)
Richard [name] called back and I told him we've had to cancel his colonoscopy due to his weight and the bed limit in the [facility].  I will try and get him on for Dr Celesta Aver hospital week in January.  I told him the endo unit is closed now so I'll call them tomorrow and be back in touch with him.

## 2016-05-20 NOTE — Telephone Encounter (Signed)
Spoke with Louie Casa and informed him that is colonoscopy will be done at [facility] on 06/18/2016 at 12:30pm, arrive at 11:00AM.  New instructions will be mailed out and I'll call him and we'll go over them.

## 2016-05-25 ENCOUNTER — Encounter: Payer: BC Managed Care – PPO | Admitting: Internal Medicine

## 2016-05-25 NOTE — Telephone Encounter (Signed)
  Called Richard [name] to go over his prep instuctions and he told me he needs to reschedule .  I set him up for 08/13/16 at 8:00AM which is Dr Celesta Aver Friday at [facility].  I have printed new instructions and they will go out in the mail today to him.  He is aware of the new date/time and will call with any questions once he reads over them. I called and informed Amy Hazelwood of the date/time change.

## 2016-06-10 ENCOUNTER — Encounter: Payer: Self-pay | Admitting: Family Medicine

## 2016-06-10 ENCOUNTER — Ambulatory Visit (INDEPENDENT_AMBULATORY_CARE_PROVIDER_SITE_OTHER): Payer: BC Managed Care – PPO | Admitting: Family Medicine

## 2016-06-10 VITALS — BP 138/72 | HR 89 | Temp 98.7°F | Resp 18 | Ht 72.0 in | Wt 366.4 lb

## 2016-06-10 DIAGNOSIS — IMO0002 Reserved for concepts with insufficient information to code with codable children: Secondary | ICD-10-CM

## 2016-06-10 DIAGNOSIS — J0100 Acute maxillary sinusitis, unspecified: Secondary | ICD-10-CM | POA: Diagnosis not present

## 2016-06-10 DIAGNOSIS — R059 Cough, unspecified: Secondary | ICD-10-CM

## 2016-06-10 DIAGNOSIS — E1165 Type 2 diabetes mellitus with hyperglycemia: Secondary | ICD-10-CM

## 2016-06-10 DIAGNOSIS — R05 Cough: Secondary | ICD-10-CM

## 2016-06-10 DIAGNOSIS — E1151 Type 2 diabetes mellitus with diabetic peripheral angiopathy without gangrene: Secondary | ICD-10-CM | POA: Diagnosis not present

## 2016-06-10 MED ORDER — HYDROCOD POLST-CPM POLST ER 10-8 MG/5ML PO SUER: 5.0000 mL | Freq: Every evening | ORAL | 0 refills | Status: DC | PRN

## 2016-06-10 MED ORDER — METFORMIN HCL ER 500 MG PO TB24: ORAL_TABLET | ORAL | 1 refills | Status: DC

## 2016-06-10 MED ORDER — AMOXICILLIN-POT CLAVULANATE 875-125 MG PO TABS: 1.0000 | ORAL_TABLET | Freq: Two times a day (BID) | ORAL | 0 refills | Status: DC

## 2016-06-10 MED ORDER — LIRAGLUTIDE 18 MG/3ML ~~LOC~~ SOPN: 1.8000 mg | PEN_INJECTOR | Freq: Every day | SUBCUTANEOUS | 5 refills | Status: DC

## 2016-06-10 NOTE — Progress Notes (Signed)
Subjective:     Richard [name] is a 65 y.o. male who presents for evaluation of sinus pain. Symptoms include: congestion, cough, facial pain, fevers, headaches, nasal congestion, purulent rhinorrhea, sinus pressure and sore throat. Onset of symptoms was 4 days ago. Symptoms have been gradually worsening since that time. Past history is significant for no history of pneumonia or bronchitis. Patient is a non-smoker.  The following portions of the patient's history were reviewed and updated as appropriate: He  has a past medical history of Diabetes mellitus and Hyperlipidemia. He  does not have any pertinent problems on file. He  has no past surgical history on file. His family history includes Diabetes in his brother, mother, and sister; Hypertension in his sister; Stroke in his sister. He  reports that he has never smoked. He has never used smokeless tobacco. He reports that he drinks alcohol. He reports that he does not use drugs. He has a current medication list which includes the following prescription(s): atorvastatin, bisacodyl, cyclobenzaprine, fexofenadine, fluticasone, ibuprofen, pen needles, liraglutide, metformin, one touch ultra test, onetouch verio, polyethylene glycol powder, amoxicillin-clavulanate, and chlorpheniramine-hydrocodone. Current Outpatient Prescriptions on File Prior to Visit  Medication Sig Dispense Refill  . atorvastatin (LIPITOR) 40 MG tablet Take 1 tablet (40 mg total) by mouth at bedtime. Repeat labs are due now 90 tablet 0  . Bisacodyl (DULCOLAX PO) Take by mouth. 10mg  x 2 for colonoscopy    . cyclobenzaprine (FLEXERIL) 10 MG tablet Take 1 tablet (10 mg total) by mouth 3 (three) times daily as needed for muscle spasms. 30 tablet 0  . fexofenadine (ALLEGRA) 180 MG tablet Take 180 mg by mouth daily.    . fluticasone (FLONASE) 50 MCG/ACT nasal spray Place 2 sprays into both nostrils daily. 16 g 5  . ibuprofen (ADVIL,MOTRIN) 100 MG tablet Take 100 mg by mouth every 6  (six) hours as needed for fever.    . Insulin Pen Needle (PEN NEEDLES) 31G X 5 MM MISC Inject 1.8 mg into the skin daily. Use with Victoza 100 each 5  . ONE TOUCH ULTRA TEST test strip Check blood sugar once daily 100 each 5  . ONETOUCH VERIO test strip Check your blood sugar daily 100 each 12  . polyethylene glycol powder (MIRALAX) powder Take 1 Container by mouth once.     No current facility-administered medications on file prior to visit.    He has No Known Allergies..  Review of Systems Pertinent items are noted in HPI.   Objective:    BP 138/72 (BP Location: Right Arm, Patient Position: Sitting, Cuff Size: Large)   Pulse 89   Temp 98.7 F (37.1 C) (Oral)   Resp 18   Ht 6' (1.829 m)   Wt (!) 366 lb 6.4 oz (166.2 kg)   SpO2 (!) 89%   BMI 49.69 kg/m  General appearance: alert, cooperative, appears stated age and no distress Ears: normal TM's and external ear canals both ears Nose: turbinates red, swollen, sinus tenderness bilateral Throat: lips, mucosa, and tongue normal; teeth and gums normal Neck: mild anterior cervical adenopathy, no JVD, supple, symmetrical, trachea midline and thyroid not enlarged, symmetric, no tenderness/mass/nodules Lungs: clear to auscultation bilaterally Heart: S1, S2 normal    Assessment:    Acute bacterial sinusitis.    Plan:    Nasal saline sprays. Antihistamines per medication orders. Augmentin per medication orders.   Cough med per orders

## 2016-06-10 NOTE — Patient Instructions (Signed)

## 2016-06-10 NOTE — Progress Notes (Signed)
Pre visit review using our clinic review tool, if applicable. No additional management support is needed unless otherwise documented below in the visit note. 

## 2016-06-22 ENCOUNTER — Ambulatory Visit (INDEPENDENT_AMBULATORY_CARE_PROVIDER_SITE_OTHER): Payer: BC Managed Care – PPO | Admitting: Medical

## 2016-06-22 ENCOUNTER — Encounter: Payer: Self-pay | Admitting: Medical

## 2016-06-22 VITALS — BP 160/80 | HR 80 | Temp 98.0°F | Resp 20 | Ht 72.0 in | Wt 369.1 lb

## 2016-06-22 DIAGNOSIS — R0981 Nasal congestion: Secondary | ICD-10-CM | POA: Diagnosis not present

## 2016-06-22 DIAGNOSIS — R059 Cough, unspecified: Secondary | ICD-10-CM

## 2016-06-22 DIAGNOSIS — R05 Cough: Secondary | ICD-10-CM | POA: Diagnosis not present

## 2016-06-22 DIAGNOSIS — J209 Acute bronchitis, unspecified: Secondary | ICD-10-CM

## 2016-06-22 DIAGNOSIS — R030 Elevated blood-pressure reading, without diagnosis of hypertension: Secondary | ICD-10-CM

## 2016-06-22 MED ORDER — AZITHROMYCIN 250 MG PO TABS: ORAL_TABLET | ORAL | 0 refills | Status: DC

## 2016-06-22 MED ORDER — HYDROCODONE-HOMATROPINE 5-1.5 MG/5ML PO SYRP: 5.0000 mL | ORAL_SOLUTION | Freq: Three times a day (TID) | ORAL | 0 refills | Status: DC | PRN

## 2016-06-22 NOTE — Progress Notes (Signed)
Pre visit review using our clinic review tool, if applicable. No additional management support is needed unless otherwise documented below in the visit note/SLS  

## 2016-06-22 NOTE — Progress Notes (Signed)
Subjective:    Patient ID: Richard [name], male    DOB: 1951/06/14, 66 y.o.   MRN: [id number]  HPI   Pt in for follow up. He states 75% better. He had head congestion, pnd, and sinus congestion. Still has some chest congestion and bringing up some mucous. Bringing up chunks of mucous. He describes mostly having chest congestion symptoms rather than sinus pressure.   He states throat feels mild swollen. No wheezing.  No fever,no chills or sweats.  He states coughing interrupting his sleep.  In morning  he feels congested in chest and sinus.  Pt has been taking some otc cough syrups.  Mucinex dm.  Pt bp high when MA checked manual  and high with machine. Lless high when I checked. Pt reports no gross motor or sensory function deficits reported.    Review of Systems  Constitutional: Negative for chills, fatigue and fever.  HENT: Positive for congestion. Negative for dental problem, nosebleeds, postnasal drip, sinus pain and sinus pressure.        Sinus congestion but on exam no pain on palpation.  Respiratory: Positive for cough. Negative for chest tightness and shortness of breath.        Productive cough and chest congestion.  Cardiovascular: Negative for chest pain and palpitations.  Gastrointestinal: Negative for abdominal pain.  Genitourinary: Negative for dysuria and flank pain.  Musculoskeletal: Negative for back pain.  Skin: Negative for rash.  Neurological: Negative for dizziness, syncope, speech difficulty and light-headedness.       None reported.  Hematological: Negative for adenopathy. Does not bruise/bleed easily.  Psychiatric/Behavioral: Negative for behavioral problems, confusion and dysphoric mood. The patient is not nervous/anxious.     Past Medical History:  Diagnosis Date  . Diabetes mellitus   . Hyperlipidemia      Social History   Social History  . Marital status: Married    Spouse name: N/A  . Number of children: N/A  . Years of education:  N/A   Occupational History  . Not on file.   Social History Main Topics  . Smoking status: Never Smoker  . Smokeless tobacco: Never Used  . Alcohol use Yes  . Drug use: No  . Sexual activity: Not on file   Other Topics Concern  . Not on file   Social History Narrative  . No narrative on file    No past surgical history on file.  Family History  Problem Relation Age of Onset  . Diabetes Mother   . Diabetes Sister   . Hypertension Sister   . Stroke Sister   . Diabetes Brother   . Colon cancer Neg Hx     No Known Allergies  Current Outpatient Prescriptions on File Prior to Visit  Medication Sig Dispense Refill  . atorvastatin (LIPITOR) 40 MG tablet Take 1 tablet (40 mg total) by mouth at bedtime. Repeat labs are due now 90 tablet 0  . fexofenadine (ALLEGRA) 180 MG tablet Take 180 mg by mouth daily as needed.     Marland Kitchen ibuprofen (ADVIL,MOTRIN) 100 MG tablet Take 100 mg by mouth every 6 (six) hours as needed for fever.    . Insulin Pen Needle (PEN NEEDLES) 31G X 5 MM MISC Inject 1.8 mg into the skin daily. Use with Victoza 100 each 5  . liraglutide (VICTOZA) 18 MG/3ML SOPN Inject 0.3 mLs (1.8 mg total) into the skin daily. 9 mL 5  . metFORMIN (GLUCOPHAGE-XR) 500 MG 24 hr tablet TAKE 2 TABLETS BY  MOUTH TWICE DAILY 360 tablet 1  . ONE TOUCH ULTRA TEST test strip Check blood sugar once daily 100 each 5  . ONETOUCH VERIO test strip Check your blood sugar daily 100 each 12  . Bisacodyl (DULCOLAX PO) Take by mouth. 10mg  x 2 for colonoscopy    . polyethylene glycol powder (MIRALAX) powder Take 1 Container by mouth once.     No current facility-administered medications on file prior to visit.     BP (!) 185/90 (BP Location: Right Arm, Cuff Size: Large)   Pulse 80   Temp 98 F (36.7 C) (Oral)   Resp 20   Ht 6' (1.829 m)   Wt (!) 369 lb 2 oz (167.4 kg)   SpO2 98%   BMI 50.06 kg/m       Objective:   Physical Exam  .General  Mental Status - Alert. General Appearance -  Well groomed. Not in acute distress.  Skin Rashes- No Rashes.  HEENT Head- Normal. Ear Auditory Canal - Left- Normal. Right - Normal.Tympanic Membrane- Left- Normal. Right- Normal. Eye Sclera/Conjunctiva- Left- Normal. Right- Normal. Nose & Sinuses Nasal Mucosa- Left-  Boggy and Congested. Right-  Boggy and  Congested.Bilateral no maxillary and no frontal sinus pressure. Mouth & Throat Lips: Upper Lip- Normal: no dryness, cracking, pallor, cyanosis, or vesicular eruption. Lower Lip-Normal: no dryness, cracking, pallor, cyanosis or vesicular eruption. Buccal Mucosa- Bilateral- No Aphthous ulcers. Oropharynx- No Discharge or Erythema. Tonsils: Characteristics- Bilateral- No Erythema or Congestion. Size/Enlargement- Bilateral- No enlargement. Discharge- bilateral-None.  Neck Neck- Supple. No Masses.   Chest and Lung Exam Auscultation: Breath Sounds:-Clear even and unlabored.  Cardiovascular Auscultation:Rythm- Regular, rate and rhythm. Murmurs & Other Heart Sounds:Ausculatation of the heart reveal- No Murmurs.  Lymphatic Head & Neck General Head & Neck Lymphatics: Bilateral: Description- No Localized lymphadenopathy.       Assessment & Plan:  You appear to have bronchitis. Rest hydrate and tylenol for fever. I am prescribing  Hycodan cough medicine, and azithromycin antibiotic. For your nasal congestion can use saline nasal spray.  If by the end of the week still coughing up mucous then do recommend chest xray  Your bp is elevated when I checked but not as high as when MA checked. I want you to get otc bp cuff and check bp daily next 2-3 days. I wrote you a presciption and hopefully insurance will cover. If not still get the cuff. Want to you to get reading less than 140/90. If you would please update [place] by Friday or sooner.  Stop otc cough med  Follow up in 7-10 days or as needed  Pt during exam  asked a question about how bp was taken. It seemed he posed the question in  rhetorical manner as if he questioned my blood pressure reading. Not entirely sure I understood his question or misunderstood intent of question?  Later during the exam I asked/offered and started to take bp reading in order to check again and he declined. Later he mentioned he came to get checked by me since he heard I was the best. Toward the end of exam he was pleasant and friendly.  Will ask Ashlee to call pt on Friday morning to see if he got bp cuff and get readings. If he did not get bp cuff and check readings then will ask her to have him come in for bp check/nurse visit.  Note today double checked with pharmacist to make sure no component of hycodan can make bp  increase. Only minimal amount of sugar in cough syrup.  Kealie Barrie, Percell Miller, PA-C

## 2016-06-22 NOTE — Patient Instructions (Addendum)
You appear to have  bronchitis. Rest hydrate and tylenol for fever. I am prescribing  Hycodan cough medicine, and azithromycin antibiotic. For your nasal congestion can use saline nasal spray.  If by the end of the week still coughing up mucous then do recommend chest xray  Your bp is elevated when I checked but not as high as when MA checked. I want you to get otc bp cuff and check bp daily next 2-3 days. I wrote you a presciption and hopefully insurance will cover. If not still get the cuff. Want to you to get reading less than 140/90. If you would please update [place] by Friday or sooner.  Stop otc cough med  Follow up in 7-10 days or as needed   Below portion not on pt avs. But see comment on pt blood pressure.

## 2016-06-29 ENCOUNTER — Ambulatory Visit (INDEPENDENT_AMBULATORY_CARE_PROVIDER_SITE_OTHER): Payer: BC Managed Care – PPO | Admitting: Physician Assistant

## 2016-06-29 ENCOUNTER — Encounter: Payer: Self-pay | Admitting: Physician Assistant

## 2016-06-29 VITALS — BP 171/78 | HR 97 | Temp 98.6°F | Ht 72.0 in | Wt 367.6 lb

## 2016-06-29 DIAGNOSIS — J029 Acute pharyngitis, unspecified: Secondary | ICD-10-CM

## 2016-06-29 DIAGNOSIS — R591 Generalized enlarged lymph nodes: Secondary | ICD-10-CM

## 2016-06-29 DIAGNOSIS — I10 Essential (primary) hypertension: Secondary | ICD-10-CM

## 2016-06-29 MED ORDER — ADULT BLOOD PRESSURE CUFF LG KIT: PACK | 0 refills | Status: DC

## 2016-06-29 MED ORDER — LOSARTAN POTASSIUM 50 MG PO TABS: 50.0000 mg | ORAL_TABLET | Freq: Every day | ORAL | 0 refills | Status: DC

## 2016-06-29 NOTE — Progress Notes (Signed)
Subjective:    Patient ID: Richard [name], male    DOB: 1951/01/25, 66 y.o.   MRN: [id number]  HPI  Richard [name] is a 66 y/o male who presents with oral pain and adenopathy. Recently treated for cough. Took one round of Augmentin and then one round of Azithromycin. He completed both of these as directed. Today he is here because he notices that the glands in his throat are swollen. He is able to eat and drink without difficulty. He hasn't had any issues with swallowing. He has had some increased saliva production in his mouth as well. He has been taking 3 Advil daily for his throat discomfort. Has not had any fevers since 06/22/16. His cough is no longer productive.  He is diabetic and, checked his blood sugars and they are 117.  Lab Results  Component Value Date   HGBA1C 7.4 (H) 02/20/2016   Per last visit note with provider, patient was found to have elevated blood pressure reading. He was told to get a blood pressure cuff and kit and was given a prescription for this however, he states that the pharmacy he went to was unable to fill his for him and was told to go to the medical supply store. He was also told to follow-up with [place] if he didn't get the blood pressure cuff, however he did not. Denies headaches, blurred vision, chest pain, swelling in legs. Does drink caffeine and soda. Has never been on blood pressure medicine. Patient appears frustrated when discussing this, and tells me, "this is the American way, just prescribe pills to fix problems without actually fixing them. I would rather be dead than take another medication." Reports that he eats a high fat diet, fried foods often.  BP Readings from Last 3 Encounters:  06/29/16 (!) 171/78  06/22/16 (!) 160/80  06/10/16 138/72   Review of Systems  See HPI  Past Medical History:  Diagnosis Date  . Diabetes mellitus   . Hyperlipidemia      Social History   Social History  . Marital status: Married    Spouse name: N/A  . Number  of children: N/A  . Years of education: N/A   Occupational History  . Not on file.   Social History Main Topics  . Smoking status: Never Smoker  . Smokeless tobacco: Never Used  . Alcohol use Yes  . Drug use: No  . Sexual activity: Not on file   Other Topics Concern  . Not on file   Social History Narrative  . No narrative on file    No past surgical history on file.  Family History  Problem Relation Age of Onset  . Diabetes Mother   . Diabetes Sister   . Hypertension Sister   . Stroke Sister   . Diabetes Brother   . Colon cancer Neg Hx     No Known Allergies  Current Outpatient Prescriptions on File Prior to Visit  Medication Sig Dispense Refill  . atorvastatin (LIPITOR) 40 MG tablet Take 1 tablet (40 mg total) by mouth at bedtime. Repeat labs are due now 90 tablet 0  . Bisacodyl (DULCOLAX PO) Take by mouth. 24m x 2 for colonoscopy    . ibuprofen (ADVIL,MOTRIN) 100 MG tablet Take 100 mg by mouth every 6 (six) hours as needed for fever.    . Insulin Pen Needle (PEN NEEDLES) 31G X 5 MM MISC Inject 1.8 mg into the skin daily. Use with Victoza 100 each 5  . liraglutide (  VICTOZA) 18 MG/3ML SOPN Inject 0.3 mLs (1.8 mg total) into the skin daily. 9 mL 5  . metFORMIN (GLUCOPHAGE-XR) 500 MG 24 hr tablet TAKE 2 TABLETS BY MOUTH TWICE DAILY 360 tablet 1  . ONE TOUCH ULTRA TEST test strip Check blood sugar once daily 100 each 5  . ONETOUCH VERIO test strip Check your blood sugar daily 100 each 12  . polyethylene glycol powder (MIRALAX) powder Take 1 Container by mouth once.    . fexofenadine (ALLEGRA) 180 MG tablet Take 180 mg by mouth daily as needed.     Marland Kitchen HYDROcodone-homatropine (HYCODAN) 5-1.5 MG/5ML syrup Take 5 mLs by mouth every 8 (eight) hours as needed for cough. (Patient not taking: Reported on 06/29/2016) 120 mL 0   No current facility-administered medications on file prior to visit.     BP (!) 171/78   Pulse 97   Temp 98.6 F (37 C) (Oral)   Ht 6' (1.829 m)    Wt (!) 367 lb 9.6 oz (166.7 kg)   SpO2 100%   BMI 49.86 kg/m        Objective:   Physical Exam  Constitutional: He appears well-developed and well-nourished.  HENT:  Head: Normocephalic and atraumatic.  Right Ear: Tympanic membrane, external ear and ear canal normal. Tympanic membrane is not erythematous, not retracted and not bulging.  Left Ear: Tympanic membrane, external ear and ear canal normal. Tympanic membrane is not erythematous, not retracted and not bulging.  Nose: Nose normal. Right sinus exhibits no maxillary sinus tenderness and no frontal sinus tenderness. Left sinus exhibits no maxillary sinus tenderness and no frontal sinus tenderness.  Mouth/Throat: Posterior oropharyngeal edema and posterior oropharyngeal erythema present.  Tonsillar hypertrophy 1+  Cardiovascular: Normal rate, regular rhythm and normal heart sounds.   Pulmonary/Chest: Effort normal and breath sounds normal. No accessory muscle usage. No respiratory distress.  Lymphadenopathy:    He has cervical adenopathy.  Bilateral cervical lymphadenapathy  Nursing note and vitals reviewed.      Assessment & Plan:  1. Essential hypertension Discussed risks of not starting blood pressure medication, including stroke, heart attack, death, etc. We reviewed his blood pressures since Jan 2016 and he has had intermittent episodes of elevated readings. After discussion, patient was agreeable to try a medication. We are starting Cozaar 50 mg and he is to follow-up in two weeks for nurse visit and lab draw. If he has any concerns before then, he should follow up with PCP. If any chest pain, SOB, or other concerning symptoms, he should go to the Er. I have also provided patient with written prescription for blood pressure kit. - Basic metabolic panel; Future  2. Sore throat and Lymphadenopathy Will send culture for strep. Suspect that he is recovering from recent URI. He has already completed two rounds of antibiotics.  Recommend that he decreases the amount of Ibuprofen that he is taking and try Tylenol, to help prevent worsening blood pressure issues. We will call with results of strep culture and add antibiotic if needed. - Culture, Group A Strep  Inda Coke PA-C 06/29/16

## 2016-06-29 NOTE — Patient Instructions (Addendum)
It was great meeting you today!  Take Tylenol as we discussed for your blood pressure. You may take up to 3000mg  daily. We will call you with the results of your throat culture.  Start the blood pressure medication. Please follow-up in two weeks for a nurse visit to have your blood pressure re-checked. We will also do labs at that time. Please follow-up sooner if you having any additional concerns.   Hypertension Hypertension is another name for high blood pressure. High blood pressure forces your heart to work harder to pump blood. A blood pressure reading has two numbers, which includes a higher number over a lower number (example: 110/72). Follow these instructions at home:  Have your blood pressure rechecked by your doctor.  Only take medicine as told by your doctor. Follow the directions carefully. The medicine does not work as well if you skip doses. Skipping doses also puts you at risk for problems.  Do not smoke.  Monitor your blood pressure at home as told by your doctor. Contact a doctor if:  You think you are having a reaction to the medicine you are taking.  You have repeat headaches or feel dizzy.  You have puffiness (swelling) in your ankles.  You have trouble with your vision. Get help right away if:  You get a very bad headache and are confused.  You feel weak, numb, or faint.  You get chest or belly (abdominal) pain.  You throw up (vomit).  You cannot breathe very well. This information is not intended to replace advice given to you by your health care provider. Make sure you discuss any questions you have with your health care provider. Document Released: 11/17/2007 Document Revised: 11/06/2015 Document Reviewed: 03/23/2013 Elsevier Interactive Patient Education  2017 [employer].

## 2016-06-30 LAB — CULTURE, GROUP A STREP: ORGANISM ID, BACTERIA: NORMAL

## 2016-07-02 ENCOUNTER — Other Ambulatory Visit: Payer: Self-pay

## 2016-07-02 MED ORDER — AMOXICILLIN-POT CLAVULANATE 875-125 MG PO TABS: 1.0000 | ORAL_TABLET | Freq: Two times a day (BID) | ORAL | 0 refills | Status: DC

## 2016-07-02 MED ORDER — FLUTICASONE FUROATE 27.5 MCG/SPRAY NA SUSP: 2.0000 | Freq: Every day | NASAL | 12 refills | Status: DC

## 2016-07-02 MED ORDER — FIRST-DUKES MOUTHWASH MT SUSP: 5.0000 mL | Freq: Three times a day (TID) | OROMUCOSAL | 0 refills | Status: DC | PRN

## 2016-07-02 NOTE — Progress Notes (Unsigned)
au

## 2016-07-02 NOTE — Addendum Note (Signed)
Addended by: Erlene Quan on: 07/02/2016 10:35 AM   Modules accepted: Orders

## 2016-07-05 ENCOUNTER — Telehealth: Payer: Self-pay | Admitting: Family Medicine

## 2016-07-05 NOTE — Telephone Encounter (Signed)
They need ratio of ingredients--([facility] mouthwash has been on back order). Will forward to patients PCP since provider no longer in the office.

## 2016-07-05 NOTE — Telephone Encounter (Signed)
Do nystatin swish and spit #150 cc 20ml po swish and spit qid

## 2016-07-05 NOTE — Telephone Encounter (Signed)
Caller name:[facility] Relationship to patient: Can be [place]:  Reason for call:has some questions regarding directions on magic mouth was. Patient was seen on 07/03/15 by Inda Coke. Please advise

## 2016-07-06 NOTE — Telephone Encounter (Signed)
Called in mouth rinse as instructed

## 2016-07-08 ENCOUNTER — Telehealth: Payer: Self-pay | Admitting: Family Medicine

## 2016-07-08 ENCOUNTER — Other Ambulatory Visit: Payer: Self-pay

## 2016-07-08 DIAGNOSIS — E139 Other specified diabetes mellitus without complications: Secondary | ICD-10-CM

## 2016-07-08 MED ORDER — GLUCOSE BLOOD VI STRP: ORAL_STRIP | 5 refills | Status: DC

## 2016-07-08 NOTE — Telephone Encounter (Signed)
Relation to [id number] Call back number:[phone]   Reason for call:  Patient currently at the pharmacy and as per pharmacy wrong test strips where sent in, please send one touch ultra blue test strips

## 2016-07-08 NOTE — Telephone Encounter (Signed)
Rx sent to pharmacy. Called in verbal order as well.

## 2016-07-16 ENCOUNTER — Ambulatory Visit (INDEPENDENT_AMBULATORY_CARE_PROVIDER_SITE_OTHER): Payer: BC Managed Care – PPO | Admitting: Family Medicine

## 2016-07-16 VITALS — BP 175/103 | HR 85

## 2016-07-16 DIAGNOSIS — I10 Essential (primary) hypertension: Secondary | ICD-10-CM | POA: Diagnosis not present

## 2016-07-16 MED ORDER — LOSARTAN POTASSIUM-HCTZ 100-25 MG PO TABS: 1.0000 | ORAL_TABLET | Freq: Every day | ORAL | 1 refills | Status: DC

## 2016-07-16 NOTE — Progress Notes (Signed)
Pre visit review using our clinic tool,if applicable. No additional management support is needed unless otherwise documented below in the visit note.   Patient in for BP check per order from Dr. Carollee Herter.  Patient states he has taken BP medications this am. BP yesterday at home was 160/92  BP today =175/103 P=85.  Per order from Dr. Carollee Herter D/C Losartan 50mg  today and start Hyzaar 100-25mg  daily for blood pressure.  Patient to return in 2 weeks for BP follow up and lab (Potassium) patient states he will schedule himself.

## 2016-07-19 ENCOUNTER — Telehealth: Payer: Self-pay | Admitting: Internal Medicine

## 2016-07-19 NOTE — Telephone Encounter (Signed)
Patient's BP has been running 180/105 , his PCP has changed his medicine and he goes back to see her 07/30/16. I told him to call us back with update.  I don't want to cancel his hospital procedure for 08/13/16 yet since we have time to get his BP better managed.  Will route to Dr Carlean Purl to make him aware.

## 2016-07-19 NOTE — Telephone Encounter (Signed)
Agree would not cancel yet

## 2016-07-22 NOTE — Progress Notes (Signed)
reviewed

## 2016-07-30 ENCOUNTER — Ambulatory Visit: Payer: BC Managed Care – PPO | Admitting: Family Medicine

## 2016-07-30 ENCOUNTER — Telehealth: Payer: Self-pay | Admitting: Family Medicine

## 2016-07-30 NOTE — Telephone Encounter (Signed)
Patient spoke with insurance today and insurance is stating patient is not covered therefore cancelling 11:15am appointment today, charge or no charge

## 2016-07-30 NOTE — Telephone Encounter (Signed)
No charge. 

## 2016-08-09 ENCOUNTER — Telehealth: Payer: Self-pay | Admitting: Internal Medicine

## 2016-08-09 NOTE — Telephone Encounter (Signed)
ok 

## 2016-08-09 NOTE — Telephone Encounter (Signed)
  I spoke with Richard [name] and he said he is on his wife's insurance and they accidentally cancelled his insurance therefore he wanted me to cancel his colonoscopy for 08/13/16 with Dr Carlean Purl at the hospital.  He is also concerned about his BP running up.  He is on medication for this.  He will call us back when all this matter is straightened out to rebook him at the hospital due to his weight.  He said his average BP is 158/92.  He will continue to work on this.

## 2016-08-13 ENCOUNTER — Ambulatory Visit (HOSPITAL_COMMUNITY)
Admission: RE | Admit: 2016-08-13 | Payer: BC Managed Care – PPO | Source: Ambulatory Visit | Admitting: Internal Medicine

## 2016-08-13 ENCOUNTER — Encounter (HOSPITAL_COMMUNITY): Admission: RE | Payer: Self-pay | Source: Ambulatory Visit

## 2016-08-13 SURGERY — COLONOSCOPY WITH PROPOFOL
Anesthesia: Monitor Anesthesia Care

## 2016-09-02 ENCOUNTER — Telehealth: Payer: Self-pay

## 2016-09-02 ENCOUNTER — Other Ambulatory Visit: Payer: Self-pay | Admitting: Internal Medicine

## 2016-09-02 DIAGNOSIS — Z1211 Encounter for screening for malignant neoplasm of colon: Secondary | ICD-10-CM

## 2016-09-02 NOTE — Telephone Encounter (Signed)
Spoke with the patient and he has his insurance corrected so will reschedule his colonoscopy at [place] and call him back with a date for the procedure and pre-visit.   Patient has been informed of pre-visit appointment 09/16/16 at 1:00pm and [facility] colonoscopy appointment 10/06/16 at 10:00AM, arrive at 8:30AM.

## 2016-09-06 ENCOUNTER — Ambulatory Visit (INDEPENDENT_AMBULATORY_CARE_PROVIDER_SITE_OTHER): Payer: BC Managed Care – PPO | Admitting: Family Medicine

## 2016-09-06 ENCOUNTER — Encounter: Payer: Self-pay | Admitting: Family Medicine

## 2016-09-06 ENCOUNTER — Telehealth: Payer: Self-pay | Admitting: Family Medicine

## 2016-09-06 VITALS — BP 142/76 | HR 82 | Temp 98.7°F | Resp 16 | Ht 72.0 in | Wt 356.0 lb

## 2016-09-06 DIAGNOSIS — I10 Essential (primary) hypertension: Secondary | ICD-10-CM | POA: Diagnosis not present

## 2016-09-06 DIAGNOSIS — E1165 Type 2 diabetes mellitus with hyperglycemia: Secondary | ICD-10-CM

## 2016-09-06 DIAGNOSIS — I152 Hypertension secondary to endocrine disorders: Secondary | ICD-10-CM | POA: Insufficient documentation

## 2016-09-06 DIAGNOSIS — E1151 Type 2 diabetes mellitus with diabetic peripheral angiopathy without gangrene: Secondary | ICD-10-CM | POA: Diagnosis not present

## 2016-09-06 DIAGNOSIS — IMO0002 Reserved for concepts with insufficient information to code with codable children: Secondary | ICD-10-CM

## 2016-09-06 DIAGNOSIS — E785 Hyperlipidemia, unspecified: Secondary | ICD-10-CM

## 2016-09-06 DIAGNOSIS — E1159 Type 2 diabetes mellitus with other circulatory complications: Secondary | ICD-10-CM | POA: Insufficient documentation

## 2016-09-06 LAB — LIPID PANEL
CHOL/HDL RATIO: 3
Cholesterol: 138 mg/dL (ref 0–200)
HDL: 46.4 mg/dL (ref >39.00)
LDL CALC: 72 mg/dL (ref 0–99)
NonHDL: 91.93
TRIGLYCERIDES: 99 mg/dL (ref 0.0–149.0)
VLDL: 19.8 mg/dL (ref 0.0–40.0)

## 2016-09-06 LAB — HEMOGLOBIN A1C: Hgb A1c MFr Bld: 7.6 % — ABNORMAL HIGH (ref 4.6–6.5)

## 2016-09-06 LAB — COMPREHENSIVE METABOLIC PANEL
ALT: 19 U/L (ref 0–53)
AST: 18 U/L (ref 0–37)
Albumin: 4.4 g/dL (ref 3.5–5.2)
Alkaline Phosphatase: 49 U/L (ref 39–117)
BUN: 13 mg/dL (ref 6–23)
CALCIUM: 9.9 mg/dL (ref 8.4–10.5)
CHLORIDE: 100 meq/L (ref 96–112)
CO2: 28 meq/L (ref 19–32)
CREATININE: 0.95 mg/dL (ref 0.40–1.50)
GFR: 102.77 mL/min (ref >60.00)
Glucose, Bld: 119 mg/dL — ABNORMAL HIGH (ref 70–99)
Potassium: 4 mEq/L (ref 3.5–5.1)
Sodium: 137 mEq/L (ref 135–145)
Total Bilirubin: 0.6 mg/dL (ref 0.2–1.2)
Total Protein: 7.9 g/dL (ref 6.0–8.3)

## 2016-09-06 MED ORDER — VALSARTAN-HYDROCHLOROTHIAZIDE 160-25 MG PO TABS: 1.0000 | ORAL_TABLET | Freq: Every day | ORAL | 2 refills | Status: DC

## 2016-09-06 MED ORDER — METFORMIN HCL ER 500 MG PO TB24: ORAL_TABLET | ORAL | 1 refills | Status: DC

## 2016-09-06 MED ORDER — HYDROCODONE-HOMATROPINE 5-1.5 MG/5ML PO SYRP: 5.0000 mL | ORAL_SOLUTION | Freq: Three times a day (TID) | ORAL | 0 refills | Status: DC | PRN

## 2016-09-06 MED ORDER — METFORMIN HCL ER 500 MG PO TB24
ORAL_TABLET | ORAL | 1 refills | Status: DC
Start: 2016-09-06 — End: 2016-09-06

## 2016-09-06 NOTE — Telephone Encounter (Signed)
Dr. Lowne FYI 

## 2016-09-06 NOTE — Patient Instructions (Signed)
Carbohydrate Counting for Diabetes Mellitus, Adult Carbohydrate counting is a method for keeping track of how many carbohydrates you eat. Eating carbohydrates naturally increases the amount of sugar (glucose) in the blood. Counting how many carbohydrates you eat helps keep your blood glucose within normal limits, which helps you manage your diabetes (diabetes mellitus). It is important to know how many carbohydrates you can safely have in each meal. This is different for every person. A diet and nutrition specialist (registered dietitian) can help you make a meal plan and calculate how many carbohydrates you should have at each meal and snack. Carbohydrates are found in the following foods:  Grains, such as breads and cereals.  Dried beans and soy products.  Starchy vegetables, such as potatoes, peas, and corn.  Fruit and fruit juices.  Milk and yogurt.  Sweets and snack foods, such as cake, cookies, candy, chips, and soft drinks. How do I count carbohydrates? There are two ways to count carbohydrates in food. You can use either of the methods or a combination of both. Reading "Nutrition Facts" on packaged food  The "Nutrition Facts" list is included on the labels of almost all packaged foods and beverages in the [place] It includes:  The serving size.  Information about nutrients in each serving, including the grams (g) of carbohydrate per serving. To use the "Nutrition Facts":  Decide how many servings you will have.  Multiply the number of servings by the number of carbohydrates per serving.  The resulting number is the total amount of carbohydrates that you will be having. Learning standard serving sizes of other foods  When you eat foods containing carbohydrates that are not packaged or do not include "Nutrition Facts" on the label, you need to measure the servings in order to count the amount of carbohydrates:  Measure the foods that you will eat with a food scale or measuring  cup, if needed.  Decide how many standard-size servings you will eat.  Multiply the number of servings by 15. Most carbohydrate-rich foods have about 15 g of carbohydrates per serving.  For example, if you eat 8 oz (170 g) of strawberries, you will have eaten 2 servings and 30 g of carbohydrates (2 servings x 15 g = 30 g).  For foods that have more than one food mixed, such as soups and casseroles, you must count the carbohydrates in each food that is included. The following list contains standard serving sizes of common carbohydrate-rich foods. Each of these servings has about 15 g of carbohydrates:   hamburger bun or  English muffin.   oz (15 mL) syrup.   oz (14 g) jelly.  1 slice of bread.  1 six-inch tortilla.  3 oz (85 g) cooked rice or pasta.  4 oz (113 g) cooked dried beans.  4 oz (113 g) starchy vegetable, such as peas, corn, or potatoes.  4 oz (113 g) hot cereal.  4 oz (113 g) mashed potatoes or  of a large baked potato.  4 oz (113 g) canned or frozen fruit.  4 oz (120 mL) fruit juice.  4-6 crackers.  6 chicken nuggets.  6 oz (170 g) unsweetened dry cereal.  6 oz (170 g) plain fat-free yogurt or yogurt sweetened with artificial sweeteners.  8 oz (240 mL) milk.  8 oz (170 g) fresh fruit or one small piece of fruit.  24 oz (680 g) popped popcorn. Example of carbohydrate counting Sample meal   3 oz (85 g) chicken breast.  6  oz (170 g) brown rice.  4 oz (113 g) corn.  8 oz (240 mL) milk.  8 oz (170 g) strawberries with sugar-free whipped topping. Carbohydrate calculation  1. Identify the foods that contain carbohydrates:  Rice.  Corn.  Milk.  Strawberries. 2. Calculate how many servings you have of each food:  2 servings rice.  1 serving corn.  1 serving milk.  1 serving strawberries. 3. Multiply each number of servings by 15 g:  2 servings rice x 15 g = 30 g.  1 serving corn x 15 g = 15 g.  1 serving milk x 15 g = 15  g.  1 serving strawberries x 15 g = 15 g. 4. Add together all of the amounts to find the total grams of carbohydrates eaten:  30 g + 15 g + 15 g + 15 g = 75 g of carbohydrates total. This information is not intended to replace advice given to you by your health care provider. Make sure you discuss any questions you have with your health care provider. Document Released: 05/31/2005 Document Revised: 12/19/2015 Document Reviewed: 11/12/2015 Elsevier Interactive Patient Education  2017 [place] DASH stands for "Dietary Approaches to Stop Hypertension." The DASH eating plan is a healthy eating plan that has been shown to reduce high blood pressure (hypertension). It may also reduce your risk for type 2 diabetes, heart disease, and stroke. The DASH eating plan may also help with weight loss. What are tips for following this plan? General guidelines   Avoid eating more than 2,300 mg (milligrams) of salt (sodium) a day. If you have hypertension, you may need to reduce your sodium intake to 1,500 mg a day.  Limit alcohol intake to no more than 1 drink a day for nonpregnant women and 2 drinks a day for men. One drink equals 12 oz of beer, 5 oz of wine, or 1 oz of hard liquor.  Work with your health care provider to maintain a healthy body weight or to lose weight. Ask what an ideal weight is for you.  Get at least 30 minutes of exercise that causes your heart to beat faster (aerobic exercise) most days of the week. Activities may include walking, swimming, or biking.  Work with your health care provider or diet and nutrition specialist (dietitian) to adjust your eating plan to your individual calorie needs. Reading food labels   Check food labels for the amount of sodium per serving. Choose foods with less than 5 percent of the Daily Value of sodium. Generally, foods with less than 300 mg of sodium per serving fit into this eating plan.  To find whole grains, look for  the word "whole" as the first word in the ingredient list. Shopping   Buy products labeled as "low-sodium" or "no salt added."  Buy fresh foods. Avoid canned foods and premade or frozen meals. Cooking   Avoid adding salt when cooking. Use salt-free seasonings or herbs instead of table salt or sea salt. Check with your health care provider or pharmacist before using salt substitutes.  Do not fry foods. Cook foods using healthy methods such as baking, boiling, grilling, and broiling instead.  Cook with heart-healthy oils, such as olive, canola, soybean, or sunflower oil. Meal planning    Eat a balanced diet that includes:  5 or more servings of fruits and vegetables each day. At each meal, try to fill half of your plate with fruits and vegetables.  Up  to 6-8 servings of whole grains each day.  Less than 6 oz of lean meat, poultry, or fish each day. A 3-oz serving of meat is about the same size as a deck of cards. One egg equals 1 oz.  2 servings of low-fat dairy each day.  A serving of nuts, seeds, or beans 5 times each week.  Heart-healthy fats. Healthy fats called Omega-3 fatty acids are found in foods such as flaxseeds and coldwater fish, like sardines, salmon, and mackerel.  Limit how much you eat of the following:  Canned or prepackaged foods.  Food that is high in trans fat, such as fried foods.  Food that is high in saturated fat, such as fatty meat.  Sweets, desserts, sugary drinks, and other foods with added sugar.  Full-fat dairy products.  Do not salt foods before eating.  Try to eat at least 2 vegetarian meals each week.  Eat more home-cooked food and less restaurant, buffet, and fast food.  When eating at a restaurant, ask that your food be prepared with less salt or no salt, if possible. What foods are recommended? The items listed may not be a complete list. Talk with your dietitian about what dietary choices are best for you. Grains  Whole-grain or  whole-wheat bread. Whole-grain or whole-wheat pasta. Brown rice. [place]. Bulgur. Whole-grain and low-sodium cereals. Pita bread. Low-fat, low-sodium crackers. Whole-wheat flour tortillas. Vegetables  Fresh or frozen vegetables (raw, steamed, roasted, or grilled). Low-sodium or reduced-sodium tomato and vegetable juice. Low-sodium or reduced-sodium tomato sauce and tomato paste. Low-sodium or reduced-sodium canned vegetables. Fruits  All fresh, dried, or frozen fruit. Canned fruit in natural juice (without added sugar). Meat and other protein foods  Skinless chicken or [place]. Ground chicken or [place]. Pork with fat trimmed off. Fish and seafood. Egg whites. Dried beans, peas, or lentils. Unsalted nuts, nut butters, and seeds. Unsalted canned beans. Lean cuts of beef with fat trimmed off. Low-sodium, lean deli meat. Dairy  Low-fat (1%) or fat-free (skim) milk. Fat-free, low-fat, or reduced-fat cheeses. Nonfat, low-sodium ricotta or cottage cheese. Low-fat or nonfat yogurt. Low-fat, low-sodium cheese. Fats and oils  Soft margarine without trans fats. Vegetable oil. Low-fat, reduced-fat, or light mayonnaise and salad dressings (reduced-sodium). Canola, safflower, olive, soybean, and sunflower oils. Avocado. Seasoning and other foods  Herbs. Spices. Seasoning mixes without salt. Unsalted popcorn and pretzels. Fat-free sweets. What foods are not recommended? The items listed may not be a complete list. Talk with your dietitian about what dietary choices are best for you. Grains  Baked goods made with fat, such as croissants, muffins, or some breads. Dry pasta or rice meal packs. Vegetables  Creamed or fried vegetables. Vegetables in a cheese sauce. Regular canned vegetables (not low-sodium or reduced-sodium). Regular canned tomato sauce and paste (not low-sodium or reduced-sodium). Regular tomato and vegetable juice (not low-sodium or reduced-sodium). Angie Fava. Olives. Fruits  Canned fruit in  a light or heavy syrup. Fried fruit. Fruit in cream or butter sauce. Meat and other protein foods  Fatty cuts of meat. Ribs. Fried meat. Berniece Salines. Sausage. Bologna and other processed lunch meats. Salami. Fatback. Hotdogs. Bratwurst. Salted nuts and seeds. Canned beans with added salt. Canned or smoked fish. Whole eggs or egg yolks. Chicken or [place] with skin. Dairy  Whole or 2% milk, cream, and half-and-half. Whole or full-fat cream cheese. Whole-fat or sweetened yogurt. Full-fat cheese. Nondairy creamers. Whipped toppings. Processed cheese and cheese spreads. Fats and oils  Butter. Stick margarine. Lard. Shortening. Ghee.  Bacon fat. Tropical oils, such as coconut, palm kernel, or palm oil. Seasoning and other foods  Salted popcorn and pretzels. Onion salt, garlic salt, seasoned salt, table salt, and sea salt. Worcestershire sauce. Tartar sauce. Barbecue sauce. Teriyaki sauce. Soy sauce, including reduced-sodium. Steak sauce. Canned and packaged gravies. Fish sauce. Oyster sauce. Cocktail sauce. Horseradish that you find on the shelf. Ketchup. Mustard. Meat flavorings and tenderizers. Bouillon cubes. Hot sauce and Tabasco sauce. Premade or packaged marinades. Premade or packaged taco seasonings. Relishes. Regular salad dressings. Where to find more information:  National Heart, Lung, and [place]: https://wilson-eaton.com/  American Heart Association: www.heart.org Summary  The DASH eating plan is a healthy eating plan that has been shown to reduce high blood pressure (hypertension). It may also reduce your risk for type 2 diabetes, heart disease, and stroke.  With the DASH eating plan, you should limit salt (sodium) intake to 2,300 mg a day. If you have hypertension, you may need to reduce your sodium intake to 1,500 mg a day.  When on the DASH eating plan, aim to eat more fresh fruits and vegetables, whole grains, lean proteins, low-fat dairy, and heart-healthy fats.  Work with your health care  provider or diet and nutrition specialist (dietitian) to adjust your eating plan to your individual calorie needs. This information is not intended to replace advice given to you by your health care provider. Make sure you discuss any questions you have with your health care provider. Document Released: 05/20/2011 Document Revised: 05/24/2016 Document Reviewed: 05/24/2016 Elsevier Interactive Patient Education  2017 [employer].

## 2016-09-06 NOTE — Progress Notes (Signed)
Pre visit review using our clinic review tool, if applicable. No additional management support is needed unless otherwise documented below in the visit note. 

## 2016-09-06 NOTE — Progress Notes (Signed)
Patient ID: Richard [name], male   DOB: 1950/07/25, 66 y.o.   MRN: [id number]     Subjective:  I acted as a Education administrator for Dr. Carollee Herter.  Richard [name], [place]   Patient ID: Richard [name], male    DOB: 1950/07/31, 66 y.o.   MRN: [id number]  Chief Complaint  Patient presents with  . Hypertension  . Hyperlipidemia  . Diabetes    HPI  Patient is in today for follow up blood pressure, cholesterol, and diabetes.  Ranges have been mostly in the high ranges but as low as 138/80 at home.  Took blood pressure in office with home blood pressure monitor left arm 182/102.   Second week on blood pressure medication pressure ran low 80/60.   Blood sugars have been running a little high since taking new medication.  They have been running 130s-140s.  He checks before he eats in the morning.    Patient Care Team: Ann Held, DO as PCP - General   Past Medical History:  Diagnosis Date  . Diabetes mellitus   . Hyperlipidemia     No past surgical history on file.  Family History  Problem Relation Age of Onset  . Diabetes Mother   . Diabetes Sister   . Hypertension Sister   . Stroke Sister   . Diabetes Brother   . Colon cancer Neg Hx     Social History   Social History  . Marital status: Married    Spouse name: N/A  . Number of children: N/A  . Years of education: N/A   Occupational History  . Not on file.   Social History Main Topics  . Smoking status: Never Smoker  . Smokeless tobacco: Never Used  . Alcohol use Yes  . Drug use: No  . Sexual activity: Not on file   Other Topics Concern  . Not on file   Social History Narrative  . No narrative on file    Outpatient Medications Prior to Visit  Medication Sig Dispense Refill  . atorvastatin (LIPITOR) 40 MG tablet Take 1 tablet (40 mg total) by mouth at bedtime. Repeat labs are due now 90 tablet 0  . Bisacodyl (DULCOLAX PO) Take by mouth. 50m x 2 for colonoscopy    . Blood Pressure Monitoring (ADULT BLOOD  PRESSURE CUFF LG) KIT Please pick up blood pressure cuff and check blood pressure daily, goal is less than 140/90. 1 each 0  . Diphenhyd-Hydrocort-Nystatin (FIRST-DUKES MOUTHWASH) SUSP Use as directed 5 mLs in the mouth or throat 3 (three) times daily as needed. Swish and spit, do not swallow. 237 mL 0  . fexofenadine (ALLEGRA) 180 MG tablet Take 180 mg by mouth daily as needed.     . fluticasone (FLONASE SENSIMIST) 27.5 MCG/SPRAY nasal spray Place 2 sprays into the nose daily. 10 g 12  . glucose blood test strip Use as instructed 100 each 5  . ibuprofen (ADVIL,MOTRIN) 100 MG tablet Take 100 mg by mouth every 6 (six) hours as needed for fever.    . Insulin Pen Needle (PEN NEEDLES) 31G X 5 MM MISC Inject 1.8 mg into the skin daily. Use with Victoza 100 each 5  . liraglutide (VICTOZA) 18 MG/3ML SOPN Inject 0.3 mLs (1.8 mg total) into the skin daily. 9 mL 5  . ONE TOUCH ULTRA TEST test strip Check blood sugar once daily 100 each 5  . ONETOUCH VERIO test strip Check your blood sugar daily 100 each 12  .  polyethylene glycol powder (MIRALAX) powder Take 1 Container by mouth once.    Marland Kitchen losartan-hydrochlorothiazide (HYZAAR) 100-25 MG tablet Take 1 tablet by mouth daily. 30 tablet 1  . metFORMIN (GLUCOPHAGE-XR) 500 MG 24 hr tablet TAKE 2 TABLETS BY MOUTH TWICE DAILY 360 tablet 1  . HYDROcodone-homatropine (HYCODAN) 5-1.5 MG/5ML syrup Take 5 mLs by mouth every 8 (eight) hours as needed for cough. (Patient not taking: Reported on 06/29/2016) 120 mL 0   No facility-administered medications prior to visit.     No Known Allergies  Review of Systems  Constitutional: Negative for chills, fever and malaise/fatigue.  HENT: Negative for congestion and hearing loss.   Eyes: Negative for discharge.  Respiratory: Negative for cough, sputum production and shortness of breath.   Cardiovascular: Negative for chest pain, palpitations and leg swelling.  Gastrointestinal: Negative for abdominal pain, blood in stool,  constipation, diarrhea, heartburn, nausea and vomiting.  Genitourinary: Negative for dysuria, frequency, hematuria and urgency.  Musculoskeletal: Negative for back pain, falls and myalgias.  Skin: Negative for rash.  Neurological: Negative for dizziness, sensory change, loss of consciousness, weakness and headaches.  Endo/Heme/Allergies: Negative for environmental allergies. Does not bruise/bleed easily.  Psychiatric/Behavioral: Negative for depression and suicidal ideas. The patient is not nervous/anxious and does not have insomnia.        Objective:    Physical Exam  Constitutional: He is oriented to person, place, and time. Vital signs are normal. He appears well-developed and well-nourished. He is sleeping.  HENT:  Head: Normocephalic and atraumatic.  Mouth/Throat: Oropharynx is clear and moist.  Eyes: EOM are normal. Pupils are equal, round, and reactive to light.  Neck: Normal range of motion. Neck supple. No thyromegaly present.  Cardiovascular: Normal rate and regular rhythm.   No murmur heard. Pulmonary/Chest: Effort normal and breath sounds normal. No respiratory distress. He has no wheezes. He has no rales. He exhibits no tenderness.  Musculoskeletal: He exhibits no edema or tenderness.  Neurological: He is alert and oriented to person, place, and time.  Skin: Skin is warm and dry.  Psychiatric: He has a normal mood and affect. His behavior is normal. Judgment and thought content normal.  Nursing note and vitals reviewed.   BP (!) 142/76   Pulse 82   Temp 98.7 F (37.1 C) (Oral)   Resp 16   Ht 6' (1.829 m)   Wt (!) 356 lb (161.5 kg)   SpO2 100%   BMI 48.28 kg/m  Wt Readings from Last 3 Encounters:  09/06/16 (!) 356 lb (161.5 kg)  06/29/16 (!) 367 lb 9.6 oz (166.7 kg)  06/22/16 (!) 369 lb 2 oz (167.4 kg)   BP Readings from Last 3 Encounters:  09/06/16 (!) 142/76  07/16/16 (!) 175/103  06/29/16 (!) 171/78     Immunization History  Administered Date(s)  Administered  . Td 01/23/2009    Health Maintenance  Topic Date Due  . PNEUMOCOCCAL POLYSACCHARIDE VACCINE (1) 01/17/1955  . OPHTHALMOLOGY EXAM  01/24/2014  . HEMOGLOBIN A1C  08/19/2016  . INFLUENZA VACCINE  09/11/2016 (Originally 01/13/2016)  . HIV Screening  02/19/2017 (Originally 01/17/1968)  . FOOT EXAM  02/11/2017  . TETANUS/TDAP  01/24/2019  . COLONOSCOPY  03/22/2019  . Hepatitis C Screening  Completed    Lab Results  Component Value Date   WBC 7.0 02/06/2013   HGB 15.4 02/06/2013   HCT 45.3 02/06/2013   PLT 234.0 02/06/2013   GLUCOSE 119 (H) 09/06/2016   CHOL 138 09/06/2016   TRIG 99.0  09/06/2016   HDL 46.40 09/06/2016   LDLDIRECT 148.7 03/17/2010   LDLCALC 72 09/06/2016   ALT 19 09/06/2016   AST 18 09/06/2016   NA 137 09/06/2016   K 4.0 09/06/2016   CL 100 09/06/2016   CREATININE 0.95 09/06/2016   BUN 13 09/06/2016   CO2 28 09/06/2016   TSH 0.75 01/27/2009   PSA 0.29 01/27/2009   HGBA1C 7.6 (H) 09/06/2016   MICROALBUR 1.0 04/29/2014    Lab Results  Component Value Date   TSH 0.75 01/27/2009   Lab Results  Component Value Date   WBC 7.0 02/06/2013   HGB 15.4 02/06/2013   HCT 45.3 02/06/2013   MCV 89.6 02/06/2013   PLT 234.0 02/06/2013   Lab Results  Component Value Date   NA 137 09/06/2016   K 4.0 09/06/2016   CO2 28 09/06/2016   GLUCOSE 119 (H) 09/06/2016   BUN 13 09/06/2016   CREATININE 0.95 09/06/2016   BILITOT 0.6 09/06/2016   ALKPHOS 49 09/06/2016   AST 18 09/06/2016   ALT 19 09/06/2016   PROT 7.9 09/06/2016   ALBUMIN 4.4 09/06/2016   CALCIUM 9.9 09/06/2016   GFR 102.77 09/06/2016   Lab Results  Component Value Date   CHOL 138 09/06/2016   Lab Results  Component Value Date   HDL 46.40 09/06/2016   Lab Results  Component Value Date   LDLCALC 72 09/06/2016   Lab Results  Component Value Date   TRIG 99.0 09/06/2016   Lab Results  Component Value Date   CHOLHDL 3 09/06/2016   Lab Results  Component Value Date   HGBA1C 7.6  (H) 09/06/2016         Assessment & Plan:   Problem List Items Addressed This Visit      Unprioritized   DM (diabetes mellitus) type II uncontrolled, periph vascular disorder ([place]) - Primary    Has not been controlled Check labs today      Relevant Medications   valsartan-hydrochlorothiazide (DIOVAN HCT) 160-25 MG tablet   metFORMIN (GLUCOPHAGE-XR) 500 MG 24 hr tablet   Other Relevant Orders   Hemoglobin A1c (Completed)   Comprehensive metabolic panel (Completed)   Essential hypertension    D/c losartan Start diovan Nurse visit in 2-3 weeks      Relevant Medications   valsartan-hydrochlorothiazide (DIOVAN HCT) 160-25 MG tablet   Other Relevant Orders   Comprehensive metabolic panel (Completed)   Hyperlipidemia LDL goal <70    Tolerating statin, encouraged heart healthy diet, avoid trans fats, minimize simple carbs and saturated fats. Increase exercise as tolerated      Relevant Medications   valsartan-hydrochlorothiazide (DIOVAN HCT) 160-25 MG tablet   Other Relevant Orders   Comprehensive metabolic panel (Completed)   Lipid panel (Completed)      I have discontinued Mr. Gupton losartan-hydrochlorothiazide. I am also having him start on valsartan-hydrochlorothiazide. Additionally, I am having him maintain his fexofenadine, ibuprofen, Pen Needles, atorvastatin, ONE TOUCH ULTRA TEST, ONETOUCH VERIO, Bisacodyl (DULCOLAX PO), polyethylene glycol powder, liraglutide, Adult Blood Pressure Cuff Lg, FIRST-DUKES MOUTHWASH, fluticasone, glucose blood, PREVIDENT 5000 BOOSTER PLUS, metFORMIN, and HYDROcodone-homatropine.  Meds ordered this encounter  Medications  . PREVIDENT 5000 BOOSTER PLUS 1.1 % PSTE    Sig: APP A PEA SIZED AMOUNT OF THE PASTE ONTO BRUSH AND APP TO ALL SURFACES OF THE TEETH FOR 2 MINUTES ONCE D AND SPIT OUT    Refill:  4  . valsartan-hydrochlorothiazide (DIOVAN HCT) 160-25 MG tablet    Sig: Take 1  tablet by mouth daily.    Dispense:  30 tablet     Refill:  2  . DISCONTD: HYDROcodone-homatropine (HYCODAN) 5-1.5 MG/5ML syrup    Sig: Take 5 mLs by mouth every 8 (eight) hours as needed for cough.    Dispense:  120 mL    Refill:  0  . DISCONTD: metFORMIN (GLUCOPHAGE-XR) 500 MG 24 hr tablet    Sig: TAKE 2 TABLETS BY MOUTH TWICE DAILY    Dispense:  360 tablet    Refill:  1  . metFORMIN (GLUCOPHAGE-XR) 500 MG 24 hr tablet    Sig: TAKE 2 TABLETS BY MOUTH TWICE DAILY    Dispense:  360 tablet    Refill:  1  . HYDROcodone-homatropine (HYCODAN) 5-1.5 MG/5ML syrup    Sig: Take 5 mLs by mouth every 8 (eight) hours as needed for cough.    Dispense:  120 mL    Refill:  0    CMA served as scribe during this visit. History, Physical and Plan performed by medical provider. Documentation and orders reviewed and attested to.  Ann Held, DO

## 2016-09-06 NOTE — Assessment & Plan Note (Signed)
Has not been controlled Check labs today

## 2016-09-06 NOTE — Telephone Encounter (Signed)
Caller name: Relationship to patient: Self Can be reached: [phone] Pharmacy:  Reason for call: Patient called back and wanted the CMA to know that he checked his BP at home and it read 119/64 on his meter so he is going to keep his meter

## 2016-09-06 NOTE — Assessment & Plan Note (Signed)
D/c losartan Start diovan Nurse visit in 2-3 weeks

## 2016-09-06 NOTE — Assessment & Plan Note (Signed)
Tolerating statin, encouraged heart healthy diet, avoid trans fats, minimize simple carbs and saturated fats. Increase exercise as tolerated 

## 2016-09-07 ENCOUNTER — Other Ambulatory Visit: Payer: Self-pay | Admitting: Family Medicine

## 2016-09-07 DIAGNOSIS — E785 Hyperlipidemia, unspecified: Secondary | ICD-10-CM

## 2016-09-07 DIAGNOSIS — E119 Type 2 diabetes mellitus without complications: Secondary | ICD-10-CM

## 2016-09-07 MED ORDER — DAPAGLIFLOZIN PROPANEDIOL 5 MG PO TABS: 5.0000 mg | ORAL_TABLET | Freq: Every day | ORAL | 2 refills | Status: DC

## 2016-09-09 ENCOUNTER — Telehealth: Payer: Self-pay | Admitting: Family Medicine

## 2016-09-09 NOTE — Telephone Encounter (Signed)
Patient last seen 09/06/16 with Dr. Etter Sjogren, as per patient PCP advised to return in 1 week for BP check with nurse (chart doesn't reflect orders) patient is requesting [place] due to patient stating she has a method she uses when it comes to blood pressure check. Patient scheduled for 09/16/16 with Nurse.

## 2016-09-09 NOTE — Telephone Encounter (Signed)
Noted. Thanks.

## 2016-09-16 ENCOUNTER — Ambulatory Visit (INDEPENDENT_AMBULATORY_CARE_PROVIDER_SITE_OTHER): Payer: BC Managed Care – PPO | Admitting: Family Medicine

## 2016-09-16 ENCOUNTER — Ambulatory Visit (AMBULATORY_SURGERY_CENTER): Payer: Self-pay | Admitting: *Deleted

## 2016-09-16 VITALS — Ht 72.0 in | Wt 354.0 lb

## 2016-09-16 VITALS — BP 133/61 | HR 94

## 2016-09-16 DIAGNOSIS — I10 Essential (primary) hypertension: Secondary | ICD-10-CM | POA: Diagnosis not present

## 2016-09-16 DIAGNOSIS — Z86010 Personal history of colonic polyps: Secondary | ICD-10-CM

## 2016-09-16 NOTE — Progress Notes (Signed)
No egg or soy allergy known to patient  No issues with past sedation with any surgeries  or procedures, no intubation problems  No diet pills per patient No home 02 use per patient  No blood thinners per patient  Pt denies issues with constipation  No A fib or A flutter   Pt has e mail but does not use and wouldn't give e mail for emmi

## 2016-09-16 NOTE — Progress Notes (Signed)
Pre visit review using our clinic review tool, if applicable. No additional management support is needed unless otherwise documented below in the visit note.  Patient presents in office for blood pressure recheck per OV note 09/06/16. He reports compliance with medication. Denies chest pain, headache, dizziness or any other symptoms. Today's readings were: BP 133/61 P 94 02 100%.  Per Dr. Carollee Herter: Continue current medication & regimen. Keep follow-up appointment for 12/07/16 at 2:00 PM with PCP.  Informed patient of the provider's recommendations. He verbalized understanding.

## 2016-09-16 NOTE — Patient Instructions (Signed)
Per Dr. Carollee Herter: Continue current medication & regimen. Keep follow-up appointment for 12/07/16 at 2:00 PM with PCP.

## 2016-09-17 ENCOUNTER — Encounter: Payer: Self-pay | Admitting: Family Medicine

## 2016-09-17 NOTE — Progress Notes (Signed)
Reviewed  Leahann Lempke R Lowne Chase, DO  

## 2016-10-05 ENCOUNTER — Encounter (HOSPITAL_COMMUNITY): Payer: Self-pay | Admitting: *Deleted

## 2016-10-06 ENCOUNTER — Encounter (HOSPITAL_COMMUNITY)
Admission: RE | Disposition: A | Discharge status: Home / Self Care | Payer: Self-pay | Source: Ambulatory Visit | Attending: Internal Medicine

## 2016-10-06 ENCOUNTER — Ambulatory Visit (HOSPITAL_COMMUNITY): Payer: BC Managed Care – PPO | Admitting: Anesthesiology

## 2016-10-06 ENCOUNTER — Ambulatory Visit (HOSPITAL_COMMUNITY)
Admission: RE | Admit: 2016-10-06 | Discharge: 2016-10-06 | Disposition: A | Discharge status: Home / Self Care | Payer: BC Managed Care – PPO | Source: Ambulatory Visit | Attending: Internal Medicine | Admitting: Internal Medicine

## 2016-10-06 ENCOUNTER — Encounter (HOSPITAL_COMMUNITY): Payer: Self-pay | Admitting: *Deleted

## 2016-10-06 DIAGNOSIS — I10 Essential (primary) hypertension: Secondary | ICD-10-CM | POA: Diagnosis not present

## 2016-10-06 DIAGNOSIS — Z6841 Body Mass Index (BMI) 40.0 and over, adult: Secondary | ICD-10-CM | POA: Diagnosis not present

## 2016-10-06 DIAGNOSIS — J302 Other seasonal allergic rhinitis: Secondary | ICD-10-CM | POA: Diagnosis not present

## 2016-10-06 DIAGNOSIS — Z86010 Personal history of colonic polyps: Secondary | ICD-10-CM | POA: Insufficient documentation

## 2016-10-06 DIAGNOSIS — Z1211 Encounter for screening for malignant neoplasm of colon: Secondary | ICD-10-CM | POA: Insufficient documentation

## 2016-10-06 DIAGNOSIS — Z79899 Other long term (current) drug therapy: Secondary | ICD-10-CM | POA: Diagnosis not present

## 2016-10-06 DIAGNOSIS — Z860101 Personal history of adenomatous and serrated colon polyps: Secondary | ICD-10-CM

## 2016-10-06 DIAGNOSIS — E785 Hyperlipidemia, unspecified: Secondary | ICD-10-CM | POA: Diagnosis not present

## 2016-10-06 DIAGNOSIS — K635 Polyp of colon: Secondary | ICD-10-CM

## 2016-10-06 DIAGNOSIS — E119 Type 2 diabetes mellitus without complications: Secondary | ICD-10-CM | POA: Insufficient documentation

## 2016-10-06 DIAGNOSIS — Z7984 Long term (current) use of oral hypoglycemic drugs: Secondary | ICD-10-CM | POA: Diagnosis not present

## 2016-10-06 DIAGNOSIS — D124 Benign neoplasm of descending colon: Secondary | ICD-10-CM

## 2016-10-06 HISTORY — DX: Other complications of anesthesia, initial encounter: T88.59XA

## 2016-10-06 HISTORY — DX: Personal history of colonic polyps: Z86.010

## 2016-10-06 HISTORY — PX: COLONOSCOPY WITH PROPOFOL: SHX5780

## 2016-10-06 HISTORY — DX: Personal history of adenomatous and serrated colon polyps: Z86.0101

## 2016-10-06 HISTORY — DX: Adverse effect of unspecified anesthetic, initial encounter: T41.45XA

## 2016-10-06 LAB — GLUCOSE, CAPILLARY: Glucose-Capillary: 135 mg/dL — ABNORMAL HIGH (ref 65–99)

## 2016-10-06 SURGERY — COLONOSCOPY WITH PROPOFOL
Anesthesia: Monitor Anesthesia Care

## 2016-10-06 MED ORDER — PROPOFOL 10 MG/ML IV BOLUS
INTRAVENOUS | Status: DC | PRN
  Administered 2016-10-06 (×5): 20 mg via INTRAVENOUS
  Administered 2016-10-06: 50 mg via INTRAVENOUS
  Administered 2016-10-06 (×3): 20 mg via INTRAVENOUS
  Administered 2016-10-06: 30 mg via INTRAVENOUS
  Administered 2016-10-06 (×8): 20 mg via INTRAVENOUS

## 2016-10-06 MED ORDER — LACTATED RINGERS IV SOLN
INTRAVENOUS | Status: DC
  Administered 2016-10-06: 1000 mL via INTRAVENOUS

## 2016-10-06 MED ORDER — PROPOFOL 10 MG/ML IV BOLUS
INTRAVENOUS | Status: AC
  Filled 2016-10-06: qty 40

## 2016-10-06 MED ORDER — SODIUM CHLORIDE 0.9 % IV SOLN: INTRAVENOUS | Status: DC

## 2016-10-06 SURGICAL SUPPLY — 21 items

## 2016-10-06 NOTE — Anesthesia Preprocedure Evaluation (Signed)
Anesthesia Evaluation  Patient identified by MRN, date of birth, ID band Patient awake    Reviewed: Allergy & Precautions, NPO status , Patient's Chart, lab work & pertinent test results  Airway Mallampati: II  TM Distance: >3 FB Neck ROM: Full    Dental no notable dental hx.    Pulmonary neg pulmonary ROS,    Pulmonary exam normal breath sounds clear to auscultation       Cardiovascular hypertension, Normal cardiovascular exam Rhythm:Regular Rate:Normal     Neuro/Psych negative neurological ROS  negative psych ROS   GI/Hepatic negative GI ROS, Neg liver ROS,   Endo/Other  diabetesMorbid obesity  Renal/GU negative Renal ROS  negative genitourinary   Musculoskeletal negative musculoskeletal ROS (+)   Abdominal (+) + obese,   Peds negative pediatric ROS (+)  Hematology negative hematology ROS (+)   Anesthesia Other Findings   Reproductive/Obstetrics negative OB ROS                             Anesthesia Physical Anesthesia Plan  ASA: III  Anesthesia Plan: MAC   Post-op Pain Management:    Induction: Intravenous  Airway Management Planned: Simple Face Mask  Additional Equipment:   Intra-op Plan:   Post-operative Plan:   Informed Consent: I have reviewed the patients History and Physical, chart, labs and discussed the procedure including the risks, benefits and alternatives for the proposed anesthesia with the patient or authorized representative who has indicated his/her understanding and acceptance.   Dental advisory given  Plan Discussed with: CRNA and Surgeon  Anesthesia Plan Comments:         Anesthesia Quick Evaluation

## 2016-10-06 NOTE — H&P (Signed)
[facility] History and Physical   Primary Care Physician:  Ann Held, DO   Reason for Procedure:   hx colon polyp  Plan:    Colonoscopy The risks and benefits as well as alternatives of endoscopic procedure(s) have been discussed and reviewed. All questions answered. The patient agrees to proceed.      HPI: Richard [name] is a 66 y.o. male w/ hx 6 mm adenoma removed 2010 - for surveillance colonoscopy   Past Medical History:  Diagnosis Date  . Allergy    seasonal  . Arthritis     left knee,  right hip  . Complication of anesthesia    half awake during last colonscopy  . Diabetes mellitus    type 2  . Hyperlipidemia   . Hypertension     Past Surgical History:  Procedure Laterality Date  . COLONOSCOPY      Prior to Admission medications   Medication Sig Start Date End Date Taking? Authorizing Provider  atorvastatin (LIPITOR) 40 MG tablet Take 1 tablet (40 mg total) by mouth at bedtime. Repeat labs are due now Patient taking differently: Take 40 mg by mouth 2 (two) times a week.  02/20/16  Yes Yvonne R Lowne Chase, DO  Bisacodyl (DULCOLAX PO) Take by mouth. 62m x 2 for colonoscopy   Yes Historical Provider, MD  Blood Pressure Monitoring (ADULT BLOOD PRESSURE CUFF LG) KIT Please pick up blood pressure cuff and check blood pressure daily, goal is less than 140/90. 06/29/16  Yes SInda Coke PA  dapagliflozin propanediol (FARXIGA) 5 MG TABS tablet Take 5 mg by mouth daily. 09/07/16  Yes Yvonne R Lowne Chase, DO  fexofenadine (ALLEGRA) 180 MG tablet Take 180 mg by mouth daily as needed for allergies.    Yes Historical Provider, MD  glucose blood test strip Use as instructed 07/08/16  Yes Yvonne R Lowne Chase, DO  Insulin Pen Needle (PEN NEEDLES) 31G X 5 MM MISC Inject 1.8 mg into the skin daily. Use with Victoza 02/20/16  Yes Yvonne R Lowne Chase, DO  liraglutide (VICTOZA) 18 MG/3ML SOPN Inject 0.3 mLs (1.8 mg total) into the skin daily. 06/10/16  Yes  Yvonne R Lowne Chase, DO  metFORMIN (GLUCOPHAGE-XR) 500 MG 24 hr tablet TAKE 2 TABLETS BY MOUTH TWICE DAILY 09/06/16  Yes YAlferd ApaLowne Chase, DO  ONE TOUCH ULTRA TEST test strip Check blood sugar once daily 02/20/16  Yes Yvonne R Lowne Chase, DO  [facility] test strip Check your blood sugar daily 02/20/16  Yes Yvonne R Lowne Chase, DO  polyethylene glycol powder (MIRALAX) powder Take 1 Container by mouth once.   Yes Historical Provider, MD  PREVIDENT 5000 BOOSTER PLUS 1.1 % PSTE APP A PEA SIZED AMOUNT OF THE PASTE ONTO BRUSH AND APP TO ALL SURFACES OF THE TEETH FOR 2 MINUTES ONCE D AND SPIT OUT 07/05/16  Yes Historical Provider, MD  valsartan-hydrochlorothiazide (DIOVAN HCT) 160-25 MG tablet Take 1 tablet by mouth daily. 09/06/16  Yes Yvonne R Lowne Chase, DO  fluticasone (FLONASE SENSIMIST) 27.5 MCG/SPRAY nasal spray Place 2 sprays into the nose daily. Patient not taking: Reported on 09/16/2016 07/02/16   YAlferd ApaLowne Chase, DO  HYDROcodone-homatropine ([facility] 5-1.5 MG/5ML syrup Take 5 mLs by mouth every 8 (eight) hours as needed for cough. Patient not taking: Reported on 09/16/2016 09/06/16   YAnn Held DO    Current Facility-Administered Medications  Medication Dose Route Frequency Provider Last Rate Last Dose  . 0.9 %  sodium chloride infusion   Intravenous Continuous Gatha Mayer, MD      . lactated ringers infusion   Intravenous Continuous Gatha Mayer, MD 10 mL/hr at 10/06/16 0851 1,000 mL at 10/06/16 0851    Allergies as of 09/02/2016  . (No Known Allergies)    Family History  Problem Relation Age of Onset  . Diabetes Mother   . Diabetes Sister   . Hypertension Sister   . Stroke Sister   . Diabetes Brother   . Colon cancer Neg Hx   . Colon polyps Neg Hx   . Rectal cancer Neg Hx   . Stomach cancer Neg Hx   . Esophageal cancer Neg Hx     Social History   Social History  . Marital status: Married    Spouse name: N/A  . Number of children: N/A  . Years of  education: N/A   Occupational History  . Not on file.   Social History Main Topics  . Smoking status: Never Smoker  . Smokeless tobacco: Never Used  . Alcohol use Yes     Comment: occasionally   . Drug use: No  . Sexual activity: Not on file   Other Topics Concern  . Not on file   Social History Narrative  . No narrative on file    Review of Systems: Positive for eyeglasses All other review of systems negative except as mentioned in the HPI.  Physical Exam: Vital signs in last 24 hours: Temp:  [98.2 F (36.8 C)] 98.2 F (36.8 C) (04/25 0846) Pulse Rate:  [89] 89 (04/25 0846) Resp:  [22] 22 (04/25 0846) BP: (165)/(80) 165/80 (04/25 0846) SpO2:  [96 %] 96 % (04/25 0846) Weight:  [354 lb (160.6 kg)] 354 lb (160.6 kg) (04/25 0846)   General:   Alert,  Well-developed, well-nourished, pleasant and cooperative in NAD Lungs:  Clear throughout to auscultation.   Heart:  Regular rate and rhythm; no murmurs, clicks, rubs,  or gallops. Abdomen:  Soft, nontender and nondistended. Normal bowel sounds.   Neuro/Psych:  Alert and cooperative. Normal mood and affect. A and O x 3   @Stacee Earp  Simonne Maffucci, MD, [facility] [phone] (pager) 10/06/2016 10:14 AM@

## 2016-10-06 NOTE — Anesthesia Postprocedure Evaluation (Addendum)
Anesthesia Post Note  Patient: Richard [name]  Procedure(s) Performed: Procedure(s) (LRB): COLONOSCOPY WITH PROPOFOL (N/A)  Patient location during evaluation: PACU Anesthesia Type: MAC Level of consciousness: awake and alert Pain management: pain level controlled Vital Signs Assessment: post-procedure vital signs reviewed and stable Respiratory status: spontaneous breathing, nonlabored ventilation, respiratory function stable and patient connected to nasal cannula oxygen Cardiovascular status: blood pressure returned to baseline and stable Postop Assessment: no signs of nausea or vomiting Anesthetic complications: no       Last Vitals:  Vitals:   10/06/16 0846 10/06/16 1046  BP: (!) 165/80   Pulse: 89 88  Resp: (!) 22 20  Temp: 36.8 C     Last Pain:  Vitals:   10/06/16 0846  TempSrc: Oral                 Georgeanna Radziewicz S

## 2016-10-06 NOTE — Discharge Instructions (Signed)
° °  I found and removed 2 small polyps. All else ok. I will let you know pathology results and when to have another routine colonoscopy by mail and/or My Chart.  I appreciate the opportunity to care for you. Gatha Mayer, MD, FACG  YOU HAD AN ENDOSCOPIC PROCEDURE TODAY: Refer to the procedure report and other information in the discharge instructions given to you for any specific questions about what was found during the examination. If this information does not answer your questions, please call Dr. Celesta Aver office at [phone] to clarify.   YOU SHOULD EXPECT: Some feelings of bloating in the abdomen. Passage of more gas than usual. Walking can help get rid of the air that was put into your GI tract during the procedure and reduce the bloating. If you had a lower endoscopy (such as a colonoscopy or flexible sigmoidoscopy) you may notice spotting of blood in your stool or on the toilet paper. Some abdominal soreness may be present for a day or two, also.  DIET: Your first meal following the procedure should be a light meal and then it is ok to progress to your normal diet. A half-sandwich or bowl of soup is an example of a good first meal. Heavy or fried foods are harder to digest and may make you feel nauseous or bloated. Drink plenty of fluids but you should avoid alcoholic beverages for 24 hours.   ACTIVITY: Your care partner should take you home directly after the procedure. You should plan to take it easy, moving slowly for the rest of the day. You can resume normal activity the day after the procedure however YOU SHOULD NOT DRIVE, use power tools, machinery or perform tasks that involve climbing or major physical exertion for 24 hours (because of the sedation medicines used during the test).   SYMPTOMS TO REPORT IMMEDIATELY: A gastroenterologist can be reached at any hour. Please call [phone]  for any of the following symptoms:  Following lower endoscopy (colonoscopy, flexible  sigmoidoscopy) Excessive amounts of blood in the stool  Significant tenderness, worsening of abdominal pains  Swelling of the abdomen that is new, acute  Fever of 100 or higher   FOLLOW UP:  If any biopsies were taken you will be contacted by phone or by letter within the next 1-3 weeks. Call [phone]  if you have not heard about the biopsies in 3 weeks.  Please also call with any specific questions about appointments or follow up tests.

## 2016-10-06 NOTE — Op Note (Signed)
[facility] Patient Name: Richard [name] Procedure Date: 10/06/2016 MRN: [id number] Attending MD: Gatha Mayer , MD Date of Birth: Jan 06, 1951 CSN: [id number] Age: 66 Admit Type: Outpatient Procedure:                Colonoscopy Indications:              Surveillance: Personal history of adenomatous                            polyps on last colonoscopy > 5 years ago Providers:                Gatha Mayer, MD, Kingsley Plan, RN, William Dalton, Technician Referring MD:              Medicines:                Propofol per Anesthesia, Monitored Anesthesia Care Complications:            No immediate complications. Estimated Blood Loss:     Estimated blood loss was minimal. Procedure:                Pre-Anesthesia Assessment:                           - Prior to the procedure, a History and Physical                            was performed, and patient medications and                            allergies were reviewed. The patient's tolerance of                            previous anesthesia was also reviewed. The risks                            and benefits of the procedure and the sedation                            options and risks were discussed with the patient.                            All questions were answered, and informed consent                            was obtained. Prior Anticoagulants: The patient has                            taken no previous anticoagulant or antiplatelet                            agents. ASA Grade Assessment: III - A patient with  severe systemic disease. After reviewing the risks                            and benefits, the patient was deemed in                            satisfactory condition to undergo the procedure.                           After obtaining informed consent, the colonoscope                            was passed under direct vision. Throughout the                        procedure, the patient's blood pressure, pulse, and                            oxygen saturations were monitored continuously. The                            EC-[id number] ([id number]) scope was introduced through                            the anus and advanced to the the cecum, identified                            by appendiceal orifice and ileocecal valve. The                            colonoscopy was performed without difficulty. The                            patient tolerated the procedure well. The quality                            of the bowel preparation was good. The bowel                            preparation used was Miralax. The ileocecal valve,                            appendiceal orifice, and rectum were photographed. Scope In: 10:23:06 AM Scope Out: 10:42:10 AM Scope Withdrawal Time: 0 hours 15 minutes 51 seconds  Total Procedure Duration: 0 hours 19 minutes 4 seconds  Findings:      The perianal and digital rectal examinations were normal. Pertinent       negatives include normal prostate (size, shape, and consistency).      Two sessile polyps were found in the descending colon. The polyps were 5       mm in size. These polyps were removed with a cold snare. Resection and       retrieval were complete. Verification of patient identification for the       specimen was done. Estimated blood loss was minimal.  The exam was otherwise without abnormality on direct and retroflexion       views. Impression:               - Two 5 mm polyps in the descending colon, removed                            with a cold snare. Resected and retrieved.                           - The examination was otherwise normal on direct                            and retroflexion views. Moderate Sedation:      N/A- Per Anesthesia Care Recommendation:           - Patient has a contact number available for                            emergencies. The signs and symptoms of  potential                            delayed complications were discussed with the                            patient. Return to normal activities tomorrow.                            Written discharge instructions were provided to the                            patient.                           - Resume previous diet.                           - Continue present medications.                           - Repeat colonoscopy is recommended for                            surveillance. The colonoscopy date will be                            determined after pathology results from today's                            exam become available for review. Procedure Code(s):        --- Professional ---                           [phone], Colonoscopy, flexible; with removal of                            tumor(s), polyp(s), or other lesion(s) by  snare                            technique Diagnosis Code(s):        --- Professional ---                           Z86.010, Personal history of colonic polyps                           D12.4, Benign neoplasm of descending colon CPT copyright 2016 [employer]. All rights reserved. The codes documented in this report are preliminary and upon coder review may  be revised to meet current compliance requirements. Gatha Mayer, MD 10/06/2016 10:57:11 AM This report has been signed electronically. Number of Addenda: 0

## 2016-10-06 NOTE — Transfer of Care (Signed)
Immediate Anesthesia Transfer of Care Note  Patient: Richard [name]  Procedure(s) Performed: Procedure(s): COLONOSCOPY WITH PROPOFOL (N/A)  Patient Location: PACU  Anesthesia Type:MAC  Level of Consciousness: sedated  Airway & Oxygen Therapy: Patient Spontanous Breathing and Patient connected to nasal cannula oxygen  Post-op Assessment: Report given to RN and Post -op Vital signs reviewed and stable  Post vital signs: Reviewed and stable  Last Vitals:  Vitals:   10/06/16 0846  BP: (!) 165/80  Pulse: 89  Resp: (!) 22  Temp: 36.8 C    Last Pain:  Vitals:   10/06/16 0846  TempSrc: Oral         Complications: No apparent anesthesia complications

## 2016-10-07 ENCOUNTER — Encounter (HOSPITAL_COMMUNITY): Payer: Self-pay | Admitting: Internal Medicine

## 2016-10-13 ENCOUNTER — Encounter: Payer: Self-pay | Admitting: Internal Medicine

## 2016-10-13 NOTE — Progress Notes (Signed)
Benign mucosal polyps Repeat colonoscopy 2028 Letter sent by My Chart

## 2016-11-15 NOTE — Addendum Note (Signed)
Addendum  created 11/15/16 1349 by Myrtie Soman, MD   Sign clinical note

## 2016-12-07 ENCOUNTER — Telehealth: Payer: Self-pay | Admitting: Family Medicine

## 2016-12-07 ENCOUNTER — Ambulatory Visit (INDEPENDENT_AMBULATORY_CARE_PROVIDER_SITE_OTHER): Payer: BC Managed Care – PPO | Admitting: Family Medicine

## 2016-12-07 ENCOUNTER — Encounter: Payer: Self-pay | Admitting: Family Medicine

## 2016-12-07 ENCOUNTER — Other Ambulatory Visit: Payer: BC Managed Care – PPO

## 2016-12-07 VITALS — BP 138/68 | HR 104 | Temp 98.4°F | Resp 16 | Ht 72.0 in | Wt 342.4 lb

## 2016-12-07 DIAGNOSIS — E785 Hyperlipidemia, unspecified: Secondary | ICD-10-CM | POA: Diagnosis not present

## 2016-12-07 DIAGNOSIS — I10 Essential (primary) hypertension: Secondary | ICD-10-CM | POA: Diagnosis not present

## 2016-12-07 DIAGNOSIS — E1151 Type 2 diabetes mellitus with diabetic peripheral angiopathy without gangrene: Secondary | ICD-10-CM | POA: Diagnosis not present

## 2016-12-07 DIAGNOSIS — E1165 Type 2 diabetes mellitus with hyperglycemia: Secondary | ICD-10-CM | POA: Diagnosis not present

## 2016-12-07 DIAGNOSIS — M1712 Unilateral primary osteoarthritis, left knee: Secondary | ICD-10-CM

## 2016-12-07 DIAGNOSIS — IMO0002 Reserved for concepts with insufficient information to code with codable children: Secondary | ICD-10-CM

## 2016-12-07 MED ORDER — OXYCODONE-ACETAMINOPHEN 5-325 MG PO TABS: 1.0000 | ORAL_TABLET | Freq: Three times a day (TID) | ORAL | 0 refills | Status: DC | PRN

## 2016-12-07 MED ORDER — DAPAGLIFLOZIN PROPANEDIOL 5 MG PO TABS: 5.0000 mg | ORAL_TABLET | Freq: Every day | ORAL | 1 refills | Status: DC

## 2016-12-07 MED ORDER — VALSARTAN-HYDROCHLOROTHIAZIDE 160-25 MG PO TABS: 1.0000 | ORAL_TABLET | Freq: Every day | ORAL | 1 refills | Status: DC

## 2016-12-07 MED ORDER — ATORVASTATIN CALCIUM 40 MG PO TABS: 40.0000 mg | ORAL_TABLET | Freq: Every day | ORAL | 0 refills | Status: DC

## 2016-12-07 NOTE — Progress Notes (Signed)
Patient ID: Richard [name], male   DOB: 01-24-1951, 66 y.o.   MRN: [id number]     Subjective:  I acted as a Education administrator for Dr. Carollee Herter.  Guerry Bruin, [place]   Patient ID: Richard [name], male    DOB: [birth year], 66 y.o.   MRN: [id number]  Chief Complaint  Patient presents with  . Hypertension    HPI  Patient is in today for follow up blood pressure.  He would also like a refill on his oxycodone/apap that he uses sparingly for arthritis.  Patient Care Team: Carollee Herter, Alferd Apa, DO as PCP - General   Past Medical History:  Diagnosis Date  . Allergy    seasonal  . Arthritis     left knee,  right hip  . Complication of anesthesia    half awake during last colonscopy  . Diabetes mellitus    type 2  . Hx of adenomatous colonic polyps    2010 6 mm adenoma 10/06/2016 2 5 mm descending polyps   . Hyperlipidemia   . Hypertension     Past Surgical History:  Procedure Laterality Date  . COLONOSCOPY    . COLONOSCOPY WITH PROPOFOL N/A 10/06/2016   Procedure: COLONOSCOPY WITH PROPOFOL;  Surgeon: Gatha Mayer, MD;  Location: [facility];  Service: Endoscopy;  Laterality: N/A;    Family History  Problem Relation Age of Onset  . Diabetes Mother   . Diabetes Sister   . Hypertension Sister   . Stroke Sister   . Diabetes Brother   . Colon cancer Neg Hx   . Colon polyps Neg Hx   . Rectal cancer Neg Hx   . Stomach cancer Neg Hx   . Esophageal cancer Neg Hx     Social History   Social History  . Marital status: Married    Spouse name: N/A  . Number of children: N/A  . Years of education: N/A   Occupational History  . Not on file.   Social History Main Topics  . Smoking status: Never Smoker  . Smokeless tobacco: Never Used  . Alcohol use Yes     Comment: occasionally   . Drug use: No  . Sexual activity: Not on file   Other Topics Concern  . Not on file   Social History Narrative  . No narrative on file    Outpatient Medications Prior to Visit  Medication Sig  Dispense Refill  . glucose blood test strip Use as instructed 100 each 5  . Insulin Pen Needle (PEN NEEDLES) 31G X 5 MM MISC Inject 1.8 mg into the skin daily. Use with Victoza 100 each 5  . liraglutide (VICTOZA) 18 MG/3ML SOPN Inject 0.3 mLs (1.8 mg total) into the skin daily. 9 mL 5  . metFORMIN (GLUCOPHAGE-XR) 500 MG 24 hr tablet TAKE 2 TABLETS BY MOUTH TWICE DAILY 360 tablet 1  . ONE TOUCH ULTRA TEST test strip Check blood sugar once daily 100 each 5  . ONETOUCH VERIO test strip Check your blood sugar daily 100 each 12  . atorvastatin (LIPITOR) 40 MG tablet Take 1 tablet (40 mg total) by mouth at bedtime. Repeat labs are due now (Patient taking differently: Take 40 mg by mouth 2 (two) times a week. ) 90 tablet 0  . dapagliflozin propanediol (FARXIGA) 5 MG TABS tablet Take 5 mg by mouth daily. 30 tablet 2  . valsartan-hydrochlorothiazide (DIOVAN HCT) 160-25 MG tablet Take 1 tablet by mouth daily. 30 tablet 2  . Blood Pressure Monitoring (ADULT  BLOOD PRESSURE CUFF LG) KIT Please pick up blood pressure cuff and check blood pressure daily, goal is less than 140/90. 1 each 0  . fexofenadine (ALLEGRA) 180 MG tablet Take 180 mg by mouth daily as needed for allergies.     Marland Kitchen PREVIDENT 5000 BOOSTER PLUS 1.1 % PSTE APP A PEA SIZED AMOUNT OF THE PASTE ONTO BRUSH AND APP TO ALL SURFACES OF THE TEETH FOR 2 MINUTES ONCE D AND SPIT OUT  4   No facility-administered medications prior to visit.     No Known Allergies  Review of Systems  Constitutional: Negative for chills, fever and malaise/fatigue.  HENT: Negative for congestion and hearing loss.   Eyes: Negative for discharge.  Respiratory: Negative for cough, sputum production and shortness of breath.   Cardiovascular: Negative for chest pain, palpitations and leg swelling.  Gastrointestinal: Negative for abdominal pain, blood in stool, constipation, diarrhea, heartburn, nausea and vomiting.  Genitourinary: Negative for dysuria, frequency, hematuria  and urgency.  Musculoskeletal: Negative for back pain, falls and myalgias.  Skin: Negative for rash.  Neurological: Negative for dizziness, sensory change, loss of consciousness, weakness and headaches.  Endo/Heme/Allergies: Negative for environmental allergies. Does not bruise/bleed easily.  Psychiatric/Behavioral: Negative for depression and suicidal ideas. The patient is not nervous/anxious and does not have insomnia.        Objective:    Physical Exam  Constitutional: He is oriented to person, place, and time. Vital signs are normal. He appears well-developed and well-nourished. He is sleeping.  HENT:  Head: Normocephalic and atraumatic.  Mouth/Throat: Oropharynx is clear and moist.  Eyes: EOM are normal. Pupils are equal, round, and reactive to light.  Neck: Normal range of motion. Neck supple. No thyromegaly present.  Cardiovascular: Normal rate and regular rhythm.   No murmur heard. Pulmonary/Chest: Effort normal and breath sounds normal. No respiratory distress. He has no wheezes. He has no rales. He exhibits no tenderness.  Musculoskeletal: He exhibits no edema or tenderness.  Neurological: He is alert and oriented to person, place, and time.  Skin: Skin is warm and dry.  Psychiatric: He has a normal mood and affect. His behavior is normal. Judgment and thought content normal.  Nursing note and vitals reviewed.   BP 138/68 (BP Location: Left Arm, Cuff Size: Large)   Pulse (!) 104   Temp 98.4 F (36.9 C) (Oral)   Resp 16   Ht 6' (1.829 m)   Wt (!) 342 lb 6.4 oz (155.3 kg)   SpO2 98%   BMI 46.44 kg/m  Wt Readings from Last 3 Encounters:  12/07/16 (!) 342 lb 6.4 oz (155.3 kg)  10/06/16 (!) 354 lb (160.6 kg)  09/16/16 (!) 354 lb (160.6 kg)   BP Readings from Last 3 Encounters:  12/07/16 138/68  10/06/16 (!) 159/80  09/16/16 133/61     Immunization History  Administered Date(s) Administered  . Td 01/23/2009    Health Maintenance  Topic Date Due  .  PNEUMOCOCCAL POLYSACCHARIDE VACCINE (1) 01/17/1955  . OPHTHALMOLOGY EXAM  01/24/2014  . HIV Screening  02/19/2017 (Originally 01/17/1968)  . INFLUENZA VACCINE  01/12/2017  . HEMOGLOBIN A1C  03/09/2017  . FOOT EXAM  12/07/2017  . TETANUS/TDAP  01/24/2019  . COLONOSCOPY  10/07/2026  . Hepatitis C Screening  Completed    Lab Results  Component Value Date   WBC 7.0 02/06/2013   HGB 15.4 02/06/2013   HCT 45.3 02/06/2013   PLT 234.0 02/06/2013   GLUCOSE 102 (H) 12/07/2016  CHOL 122 12/07/2016   TRIG 132.0 12/07/2016   HDL 41.80 12/07/2016   LDLDIRECT 148.7 03/17/2010   LDLCALC 53 12/07/2016   ALT 16 12/07/2016   AST 15 12/07/2016   NA 136 12/07/2016   K 3.8 12/07/2016   CL 100 12/07/2016   CREATININE 0.97 12/07/2016   BUN 13 12/07/2016   CO2 28 12/07/2016   TSH 0.75 01/27/2009   PSA 0.29 01/27/2009   HGBA1C 6.6 (H) 12/07/2016   MICROALBUR 1.0 04/29/2014    Lab Results  Component Value Date   TSH 0.75 01/27/2009   Lab Results  Component Value Date   WBC 7.0 02/06/2013   HGB 15.4 02/06/2013   HCT 45.3 02/06/2013   MCV 89.6 02/06/2013   PLT 234.0 02/06/2013   Lab Results  Component Value Date   NA 136 12/07/2016   K 3.8 12/07/2016   CO2 28 12/07/2016   GLUCOSE 102 (H) 12/07/2016   BUN 13 12/07/2016   CREATININE 0.97 12/07/2016   BILITOT 0.5 12/07/2016   ALKPHOS 64 12/07/2016   AST 15 12/07/2016   ALT 16 12/07/2016   PROT 7.4 12/07/2016   ALBUMIN 4.2 12/07/2016   CALCIUM 9.4 12/07/2016   GFR 100.24 12/07/2016   Lab Results  Component Value Date   CHOL 122 12/07/2016   Lab Results  Component Value Date   HDL 41.80 12/07/2016   Lab Results  Component Value Date   LDLCALC 53 12/07/2016   Lab Results  Component Value Date   TRIG 132.0 12/07/2016   Lab Results  Component Value Date   CHOLHDL 3 12/07/2016   Lab Results  Component Value Date   HGBA1C 6.6 (H) 12/07/2016         Assessment & Plan:   Problem List Items Addressed This Visit       Unprioritized   DM (diabetes mellitus) type II uncontrolled, periph vascular disorder ([place]) - Primary    hgba1c to be checked, minimize simple carbs. Increase exercise as tolerated. Continue current meds      Relevant Medications   valsartan-hydrochlorothiazide (DIOVAN HCT) 160-25 MG tablet   atorvastatin (LIPITOR) 40 MG tablet   dapagliflozin propanediol (FARXIGA) 5 MG TABS tablet   Other Relevant Orders   Lipid panel (Completed)   Hemoglobin A1c (Completed)   Comprehensive metabolic panel (Completed)   Essential hypertension    Well controlled, no changes to meds. Encouraged heart healthy diet such as the DASH diet and exercise as tolerated.       Relevant Medications   valsartan-hydrochlorothiazide (DIOVAN HCT) 160-25 MG tablet   atorvastatin (LIPITOR) 40 MG tablet   Other Relevant Orders   Lipid panel (Completed)   Hemoglobin A1c (Completed)   Comprehensive metabolic panel (Completed)   Hyperlipidemia LDL goal <70    Tolerating statin, encouraged heart healthy diet, avoid trans fats, minimize simple carbs and saturated fats. Increase exercise as tolerated      Relevant Medications   valsartan-hydrochlorothiazide (DIOVAN HCT) 160-25 MG tablet   atorvastatin (LIPITOR) 40 MG tablet    Other Visit Diagnoses    Hyperlipidemia, unspecified hyperlipidemia type       Relevant Medications   valsartan-hydrochlorothiazide (DIOVAN HCT) 160-25 MG tablet   atorvastatin (LIPITOR) 40 MG tablet   Other Relevant Orders   Lipid panel (Completed)   Hemoglobin A1c (Completed)   Comprehensive metabolic panel (Completed)   Primary osteoarthritis of left knee       Relevant Medications   oxyCODONE-acetaminophen (ROXICET) 5-325 MG tablet  I have discontinued Mr. Zwahlen fexofenadine, Adult Blood Pressure Cuff Lg, and PREVIDENT 5000 BOOSTER PLUS. I have also changed his atorvastatin. Additionally, I am having him start on oxyCODONE-acetaminophen. Lastly, I am having him maintain his  Pen Needles, ONE TOUCH ULTRA TEST, ONETOUCH VERIO, liraglutide, glucose blood, metFORMIN, valsartan-hydrochlorothiazide, and dapagliflozin propanediol.  Meds ordered this encounter  Medications  . oxyCODONE-acetaminophen (ROXICET) 5-325 MG tablet    Sig: Take 1 tablet by mouth every 8 (eight) hours as needed for severe pain.    Dispense:  30 tablet    Refill:  0  . valsartan-hydrochlorothiazide (DIOVAN HCT) 160-25 MG tablet    Sig: Take 1 tablet by mouth daily.    Dispense:  90 tablet    Refill:  1  . atorvastatin (LIPITOR) 40 MG tablet    Sig: Take 1 tablet (40 mg total) by mouth at bedtime.    Dispense:  90 tablet    Refill:  0  . dapagliflozin propanediol (FARXIGA) 5 MG TABS tablet    Sig: Take 5 mg by mouth daily.    Dispense:  90 tablet    Refill:  1    CMA served as Education administrator during this visit. History, Physical and Plan performed by medical provider. Documentation and orders reviewed and attested to.  Ann Held, DO

## 2016-12-07 NOTE — Telephone Encounter (Signed)
Relation to WG:NFAO Call back number:[phone]   Reason for call:  Pharmacy informed patient insurance will cover only 21 oxyCODONE-acetaminophen (ROXICET) 5-325 MG tablet and the 9 tablets patient would have to come out of pocket, patient would like to know moving forward what the process will be, patient requesting to speak with sheketia directly

## 2016-12-07 NOTE — Patient Instructions (Signed)

## 2016-12-08 ENCOUNTER — Other Ambulatory Visit: Payer: BC Managed Care – PPO

## 2016-12-08 LAB — COMPREHENSIVE METABOLIC PANEL
ALBUMIN: 4.2 g/dL (ref 3.5–5.2)
ALT: 16 U/L (ref 0–53)
AST: 15 U/L (ref 0–37)
Alkaline Phosphatase: 64 U/L (ref 39–117)
BUN: 13 mg/dL (ref 6–23)
CHLORIDE: 100 meq/L (ref 96–112)
CO2: 28 meq/L (ref 19–32)
CREATININE: 0.97 mg/dL (ref 0.40–1.50)
Calcium: 9.4 mg/dL (ref 8.4–10.5)
GFR: 100.24 mL/min (ref >60.00)
GLUCOSE: 102 mg/dL — AB (ref 70–99)
Potassium: 3.8 mEq/L (ref 3.5–5.1)
Sodium: 136 mEq/L (ref 135–145)
TOTAL PROTEIN: 7.4 g/dL (ref 6.0–8.3)
Total Bilirubin: 0.5 mg/dL (ref 0.2–1.2)

## 2016-12-08 LAB — HEMOGLOBIN A1C: HEMOGLOBIN A1C: 6.6 % — AB (ref 4.6–6.5)

## 2016-12-08 LAB — LIPID PANEL
CHOLESTEROL: 122 mg/dL (ref 0–200)
HDL: 41.8 mg/dL (ref >39.00)
LDL CALC: 53 mg/dL (ref 0–99)
NonHDL: 79.79
TRIGLYCERIDES: 132 mg/dL (ref 0.0–149.0)
Total CHOL/HDL Ratio: 3
VLDL: 26.4 mg/dL (ref 0.0–40.0)

## 2016-12-08 NOTE — Assessment & Plan Note (Signed)
hgba1c to be checked, minimize simple carbs. Increase exercise as tolerated. Continue current meds  

## 2016-12-08 NOTE — Assessment & Plan Note (Signed)
Well controlled, no changes to meds. Encouraged heart healthy diet such as the DASH diet and exercise as tolerated.  °

## 2016-12-08 NOTE — Telephone Encounter (Signed)
Patient will ask for pharmacy to contact us at next refill.  He states usually 30 pills will last 73mos to a year.

## 2016-12-08 NOTE — Telephone Encounter (Signed)
Patient is calling again, please call back at [phone]

## 2016-12-08 NOTE — Assessment & Plan Note (Signed)
Tolerating statin, encouraged heart healthy diet, avoid trans fats, minimize simple carbs and saturated fats. Increase exercise as tolerated 

## 2016-12-13 ENCOUNTER — Other Ambulatory Visit: Payer: Self-pay | Admitting: Family Medicine

## 2016-12-13 DIAGNOSIS — R739 Hyperglycemia, unspecified: Secondary | ICD-10-CM

## 2016-12-13 DIAGNOSIS — E785 Hyperlipidemia, unspecified: Secondary | ICD-10-CM

## 2017-02-22 ENCOUNTER — Other Ambulatory Visit: Payer: Self-pay | Admitting: Family Medicine

## 2017-02-25 ENCOUNTER — Telehealth: Payer: Self-pay | Admitting: Family Medicine

## 2017-02-25 ENCOUNTER — Encounter: Payer: Self-pay | Admitting: Family Medicine

## 2017-02-25 NOTE — Telephone Encounter (Signed)
Relation to pt: spouse Mrs Wiederholt Call back number: [phone]   Reason for call:  Patient requesting orders for flu shot, hep A, tetanus, yellow fever due to patient and spouse traveling to [place] on dec 9th, please advise

## 2017-02-25 NOTE — Telephone Encounter (Addendum)
error:[id number] ° °

## 2017-02-28 NOTE — Telephone Encounter (Signed)
Ok to do tdap , hep a and flu--- the yellow fever vaccine has to be given by travel clinic in [place]

## 2017-02-28 NOTE — Telephone Encounter (Signed)
Dr. Etter Sjogren   Please see message below, let me know if you want his ncir printed.

## 2017-03-01 NOTE — Telephone Encounter (Signed)
Attempted to reach pt, mailbox is full 

## 2017-03-02 NOTE — Telephone Encounter (Signed)
Informed the patient of information regarding vaccines. He will let his wife know and she will schedule a nurse visit appt. To have done.

## 2017-03-09 ENCOUNTER — Encounter: Payer: Self-pay | Admitting: Family Medicine

## 2017-03-09 ENCOUNTER — Ambulatory Visit (INDEPENDENT_AMBULATORY_CARE_PROVIDER_SITE_OTHER): Payer: BC Managed Care – PPO | Admitting: Family Medicine

## 2017-03-09 VITALS — BP 154/62 | HR 87 | Temp 98.7°F | Ht 72.0 in | Wt 341.0 lb

## 2017-03-09 DIAGNOSIS — M1712 Unilateral primary osteoarthritis, left knee: Secondary | ICD-10-CM | POA: Diagnosis not present

## 2017-03-09 DIAGNOSIS — R059 Cough, unspecified: Secondary | ICD-10-CM

## 2017-03-09 DIAGNOSIS — R05 Cough: Secondary | ICD-10-CM

## 2017-03-09 DIAGNOSIS — J069 Acute upper respiratory infection, unspecified: Secondary | ICD-10-CM

## 2017-03-09 DIAGNOSIS — Z23 Encounter for immunization: Secondary | ICD-10-CM | POA: Diagnosis not present

## 2017-03-09 MED ORDER — AMOXICILLIN-POT CLAVULANATE 875-125 MG PO TABS: 1.0000 | ORAL_TABLET | Freq: Two times a day (BID) | ORAL | 0 refills | Status: DC

## 2017-03-09 MED ORDER — HYDROCODONE-HOMATROPINE 5-1.5 MG/5ML PO SYRP: 5.0000 mL | ORAL_SOLUTION | Freq: Three times a day (TID) | ORAL | 0 refills | Status: DC | PRN

## 2017-03-09 MED ORDER — OXYCODONE-ACETAMINOPHEN 5-325 MG PO TABS: 1.0000 | ORAL_TABLET | Freq: Three times a day (TID) | ORAL | 0 refills | Status: DC | PRN

## 2017-03-09 MED ORDER — INSULIN PEN NEEDLE 31G X 5 MM MISC: 3 refills | Status: DC

## 2017-03-09 NOTE — Progress Notes (Signed)
Patient ID: Richard [name], male    DOB: [birth year]  Age: 66 y.o. MRN: [id number]    Subjective:  Subjective  HPI [name] presents for sore throat and swollen glands x 2 days  Pt took cough med --hydromet--  last night and tylenol  Pt felt terrible + productive cough -- green mucous   Review of Systems  Constitutional: Positive for chills. Negative for fever.  HENT: Positive for congestion, postnasal drip, rhinorrhea and sinus pressure.   Respiratory: Positive for cough, chest tightness, shortness of breath and wheezing.   Cardiovascular: Negative for chest pain, palpitations and leg swelling.  Allergic/Immunologic: Negative for environmental allergies.    History Past Medical History:  Diagnosis Date  . Allergy    seasonal  . Arthritis     left knee,  right hip  . Complication of anesthesia    half awake during last colonscopy  . Diabetes mellitus    type 2  . Hx of adenomatous colonic polyps    2010 6 mm adenoma 10/06/2016 2 5 mm descending polyps   . Hyperlipidemia   . Hypertension     He has a past surgical history that includes Colonoscopy and Colonoscopy with propofol (N/A, 10/06/2016).   His family history includes Diabetes in his brother, mother, and sister; Hypertension in his sister; Stroke in his sister.He reports that he has never smoked. He has never used smokeless tobacco. He reports that he drinks alcohol. He reports that he does not use drugs.  Current Outpatient Prescriptions on File Prior to Visit  Medication Sig Dispense Refill  . atorvastatin (LIPITOR) 40 MG tablet Take 1 tablet (40 mg total) by mouth at bedtime. 90 tablet 0  . dapagliflozin propanediol (FARXIGA) 5 MG TABS tablet Take 5 mg by mouth daily. 90 tablet 1  . glucose blood test strip Use as instructed 100 each 5  . liraglutide (VICTOZA) 18 MG/3ML SOPN Inject 0.3 mLs (1.8 mg total) into the skin daily. 9 mL 5  . metFORMIN (GLUCOPHAGE-XR) 500 MG 24 hr tablet TAKE 2 TABLETS BY MOUTH TWICE  DAILY 360 tablet 1  . ONE TOUCH ULTRA TEST test strip Check blood sugar once daily 100 each 5  . ONETOUCH VERIO test strip Check your blood sugar daily 100 each 12  . valsartan-hydrochlorothiazide (DIOVAN HCT) 160-25 MG tablet Take 1 tablet by mouth daily. 90 tablet 1   No current facility-administered medications on file prior to visit.      Objective:  Objective  Physical Exam  Constitutional: He is oriented to person, place, and time. He appears well-developed and well-nourished.  HENT:  Right Ear: External ear normal.  Left Ear: External ear normal.  Nose: Rhinorrhea present. Right sinus exhibits maxillary sinus tenderness and frontal sinus tenderness. Left sinus exhibits maxillary sinus tenderness and frontal sinus tenderness.  + PND + errythema  Eyes: Conjunctivae are normal. Right eye exhibits no discharge. Left eye exhibits no discharge.  Cardiovascular: Normal rate, regular rhythm and normal heart sounds.   No murmur heard. Pulmonary/Chest: Effort normal and breath sounds normal. No respiratory distress. He has no wheezes. He has no rales. He exhibits no tenderness.  Musculoskeletal: He exhibits no edema.  Lymphadenopathy:    He has no cervical adenopathy.  Neurological: He is alert and oriented to person, place, and time.  Nursing note and vitals reviewed.  BP (!) 154/62   Pulse 87   Temp 98.7 F (37.1 C) (Oral)   Ht 6' (1.829 m)   Wt (!) 341  lb (154.7 kg)   SpO2 98%   BMI 46.25 kg/m  Wt Readings from Last 3 Encounters:  03/09/17 (!) 341 lb (154.7 kg)  12/07/16 (!) 342 lb 6.4 oz (155.3 kg)  10/06/16 (!) 354 lb (160.6 kg)     Lab Results  Component Value Date   WBC 7.0 02/06/2013   HGB 15.4 02/06/2013   HCT 45.3 02/06/2013   PLT 234.0 02/06/2013   GLUCOSE 102 (H) 12/07/2016   CHOL 122 12/07/2016   TRIG 132.0 12/07/2016   HDL 41.80 12/07/2016   LDLDIRECT 148.7 03/17/2010   LDLCALC 53 12/07/2016   ALT 16 12/07/2016   AST 15 12/07/2016   NA 136  12/07/2016   K 3.8 12/07/2016   CL 100 12/07/2016   CREATININE 0.97 12/07/2016   BUN 13 12/07/2016   CO2 28 12/07/2016   TSH 0.75 01/27/2009   PSA 0.29 01/27/2009   HGBA1C 6.6 (H) 12/07/2016   MICROALBUR 1.0 04/29/2014    No results found.   Assessment & Plan:  Plan  I have changed Mr. Melucci B-D UF III MINI PEN NEEDLES to Insulin Pen Needle. I am also having him start on HYDROcodone-homatropine and amoxicillin-clavulanate. Additionally, I am having him maintain his ONE TOUCH ULTRA TEST, ONETOUCH VERIO, liraglutide, glucose blood, metFORMIN, valsartan-hydrochlorothiazide, atorvastatin, dapagliflozin propanediol, and oxyCODONE-acetaminophen.  Meds ordered this encounter  Medications  . HYDROcodone-homatropine (HYCODAN) 5-1.5 MG/5ML syrup    Sig: Take 5 mLs by mouth every 8 (eight) hours as needed for cough.    Dispense:  120 mL    Refill:  0  . amoxicillin-clavulanate (AUGMENTIN) 875-125 MG tablet    Sig: Take 1 tablet by mouth 2 (two) times daily.    Dispense:  20 tablet    Refill:  0  . oxyCODONE-acetaminophen (ROXICET) 5-325 MG tablet    Sig: Take 1 tablet by mouth every 8 (eight) hours as needed for severe pain.    Dispense:  21 tablet    Refill:  0  . Insulin Pen Needle (B-D UF III MINI PEN NEEDLES) 31G X 5 MM MISC    Sig: USE AS DIRECTED    Dispense:  100 each    Refill:  3    Problem List Items Addressed This Visit      Unprioritized   Viral upper respiratory tract infection    flonase ,, antihistamine If symptoms worsen or do not improve---  Take rx augmentin       Other Visit Diagnoses    Cough    -  Primary   Relevant Medications   HYDROcodone-homatropine (HYCODAN) 5-1.5 MG/5ML syrup   Primary osteoarthritis of left knee       Relevant Medications   oxyCODONE-acetaminophen (ROXICET) 5-325 MG tablet      Follow-up: Return if symptoms worsen or fail to improve.  Ann Held, DO

## 2017-03-09 NOTE — Patient Instructions (Signed)
Upper Respiratory Infection, Adult Most upper respiratory infections (URIs) are caused by a virus. A URI affects the nose, throat, and upper air passages. The most common type of URI is often called "the common cold." Follow these instructions at home:  Take medicines only as told by your doctor.  Gargle warm saltwater or take cough drops to comfort your throat as told by your doctor.  Use a warm mist humidifier or inhale steam from a shower to increase air moisture. This may make it easier to breathe.  Drink enough fluid to keep your pee (urine) clear or pale yellow.  Eat soups and other clear broths.  Have a healthy diet.  Rest as needed.  Go back to work when your fever is gone or your doctor says it is okay. ? You may need to stay home longer to avoid giving your URI to others. ? You can also wear a face mask and wash your hands often to prevent spread of the virus.  Use your inhaler more if you have asthma.  Do not use any tobacco products, including cigarettes, chewing tobacco, or electronic cigarettes. If you need help quitting, ask your doctor. Contact a doctor if:  You are getting worse, not better.  Your symptoms are not helped by medicine.  You have chills.  You are getting more short of breath.  You have brown or red mucus.  You have yellow or brown discharge from your nose.  You have pain in your face, especially when you bend forward.  You have a fever.  You have puffy (swollen) neck glands.  You have pain while swallowing.  You have white areas in the back of your throat. Get help right away if:  You have very bad or constant: ? Headache. ? Ear pain. ? Pain in your forehead, behind your eyes, and over your cheekbones (sinus pain). ? Chest pain.  You have long-lasting (chronic) lung disease and any of the following: ? Wheezing. ? Long-lasting cough. ? Coughing up blood. ? A change in your usual mucus.  You have a stiff neck.  You have  changes in your: ? Vision. ? Hearing. ? Thinking. ? Mood. This information is not intended to replace advice given to you by your health care provider. Make sure you discuss any questions you have with your health care provider. Document Released: 11/17/2007 Document Revised: 02/01/2016 Document Reviewed: 09/05/2013 Elsevier Interactive Patient Education  2018 [employer]  

## 2017-03-10 ENCOUNTER — Telehealth: Payer: Self-pay

## 2017-03-10 DIAGNOSIS — J069 Acute upper respiratory infection, unspecified: Secondary | ICD-10-CM | POA: Insufficient documentation

## 2017-03-10 NOTE — Telephone Encounter (Signed)
Called Pt to ask if he wanted to pick up proof of immunization or if he wanted me to mail it out to him. Pt stated it'd be better to mail it out to him. Pt stated that he's going to need an immunization card to show his vaccines for his upcoming travels to [place] and mentioned that he will be going to a travel clinic at [facility] to receive the rest of his vaccinations. I informed Pt that when he goes to travel they should be able to provide him with the card he's speaking of and I informed him that in the event [facility] doesn't have everything he needs [facility] offers a travel clinic as well at the [facility] and I gave him the phone number if he had further questions.

## 2017-03-10 NOTE — Assessment & Plan Note (Signed)
flonase ,, antihistamine If symptoms worsen or do not improve---  Take rx augmentin

## 2017-06-15 ENCOUNTER — Other Ambulatory Visit: Payer: BC Managed Care – PPO

## 2017-06-16 ENCOUNTER — Ambulatory Visit: Payer: BC Managed Care – PPO | Admitting: Family Medicine

## 2017-06-16 ENCOUNTER — Other Ambulatory Visit: Payer: BC Managed Care – PPO

## 2017-06-16 ENCOUNTER — Encounter: Payer: Self-pay | Admitting: Family Medicine

## 2017-06-16 VITALS — BP 136/70 | HR 94 | Temp 98.2°F | Resp 16 | Ht 72.0 in | Wt 336.4 lb

## 2017-06-16 DIAGNOSIS — Z79899 Other long term (current) drug therapy: Secondary | ICD-10-CM | POA: Diagnosis not present

## 2017-06-16 DIAGNOSIS — E785 Hyperlipidemia, unspecified: Secondary | ICD-10-CM

## 2017-06-16 DIAGNOSIS — E1151 Type 2 diabetes mellitus with diabetic peripheral angiopathy without gangrene: Secondary | ICD-10-CM

## 2017-06-16 DIAGNOSIS — M1712 Unilateral primary osteoarthritis, left knee: Secondary | ICD-10-CM

## 2017-06-16 DIAGNOSIS — I10 Essential (primary) hypertension: Secondary | ICD-10-CM

## 2017-06-16 DIAGNOSIS — IMO0002 Reserved for concepts with insufficient information to code with codable children: Secondary | ICD-10-CM

## 2017-06-16 DIAGNOSIS — E1165 Type 2 diabetes mellitus with hyperglycemia: Secondary | ICD-10-CM | POA: Diagnosis not present

## 2017-06-16 LAB — GLUCOSE, POCT (MANUAL RESULT ENTRY): POC Glucose: 102 mg/dl — AB (ref 70–99)

## 2017-06-16 MED ORDER — VALSARTAN-HYDROCHLOROTHIAZIDE 160-25 MG PO TABS: 1.0000 | ORAL_TABLET | Freq: Every day | ORAL | 1 refills | Status: DC

## 2017-06-16 MED ORDER — INSULIN PEN NEEDLE 31G X 5 MM MISC: 1 refills | Status: DC

## 2017-06-16 MED ORDER — ONETOUCH ULTRA BLUE VI STRP: ORAL_STRIP | 1 refills | Status: DC

## 2017-06-16 MED ORDER — OXYCODONE-ACETAMINOPHEN 5-325 MG PO TABS: 1.0000 | ORAL_TABLET | Freq: Three times a day (TID) | ORAL | 0 refills | Status: DC | PRN

## 2017-06-16 MED ORDER — DAPAGLIFLOZIN PROPANEDIOL 5 MG PO TABS: 5.0000 mg | ORAL_TABLET | Freq: Every day | ORAL | 1 refills | Status: DC

## 2017-06-16 NOTE — Progress Notes (Signed)
Patient ID: Richard [name], male    DOB: [birth year]  Age: 67 y.o. MRN: [id number]    Subjective:  Subjective  HPI Richard [name] presents for here for f/u  dm , htn, lipids. Review of Systems  Constitutional: Negative for chills, fatigue, fever and unexpected weight change.  HENT: Negative for congestion and hearing loss.   Eyes: Negative for discharge.  Respiratory: Negative for cough and shortness of breath.   Cardiovascular: Negative for chest pain, palpitations and leg swelling.  Gastrointestinal: Negative for abdominal pain, blood in stool, constipation, diarrhea, nausea and vomiting.  Genitourinary: Negative for dysuria, frequency, hematuria and urgency.  Musculoskeletal: Negative for back pain and myalgias.  Skin: Negative for rash.  Allergic/Immunologic: Negative for environmental allergies.  Neurological: Negative for dizziness, weakness and headaches.  Hematological: Does not bruise/bleed easily.  Psychiatric/Behavioral: Negative for suicidal ideas. The patient is not nervous/anxious.     History Past Medical History:  Diagnosis Date  . Allergy    seasonal  . Arthritis     left knee,  right hip  . Complication of anesthesia    half awake during last colonscopy  . Diabetes mellitus    type 2  . Hx of adenomatous colonic polyps    2010 6 mm adenoma 10/06/2016 2 5 mm descending polyps   . Hyperlipidemia   . Hypertension     He has a past surgical history that includes Colonoscopy and Colonoscopy with propofol (N/A, 10/06/2016).   His family history includes Diabetes in his brother, mother, and sister; Hypertension in his sister; Stroke in his sister.He reports that  has never smoked. he has never used smokeless tobacco. He reports that he drinks alcohol. He reports that he does not use drugs.  Current Outpatient Medications on File Prior to Visit  Medication Sig Dispense Refill  . atorvastatin (LIPITOR) 40 MG tablet Take 1 tablet (40 mg total) by mouth at bedtime.  90 tablet 0  . liraglutide (VICTOZA) 18 MG/3ML SOPN Inject 0.3 mLs (1.8 mg total) into the skin daily. 9 mL 5  . metFORMIN (GLUCOPHAGE-XR) 500 MG 24 hr tablet TAKE 2 TABLETS BY MOUTH TWICE DAILY 360 tablet 1   No current facility-administered medications on file prior to visit.      Objective:  Objective  Physical Exam  Constitutional: He is oriented to person, place, and time. Vital signs are normal. He appears well-developed and well-nourished. He is sleeping.  HENT:  Head: Normocephalic and atraumatic.  Mouth/Throat: Oropharynx is clear and moist.  Eyes: EOM are normal. Pupils are equal, round, and reactive to light.  Neck: Normal range of motion. Neck supple. No thyromegaly present.  Cardiovascular: Normal rate and regular rhythm.  No murmur heard. Pulmonary/Chest: Effort normal and breath sounds normal. No respiratory distress. He has no wheezes. He has no rales. He exhibits no tenderness.  Musculoskeletal: He exhibits no edema or tenderness.  Neurological: He is alert and oriented to person, place, and time.  Skin: Skin is warm and dry.  Psychiatric: He has a normal mood and affect. His behavior is normal. Judgment and thought content normal.  Nursing note and vitals reviewed.  BP 136/70 (BP Location: Right Arm, Cuff Size: Large)   Pulse 94   Temp 98.2 F (36.8 C) (Oral)   Resp 16   Ht 6' (1.829 m)   Wt (!) 336 lb 6.4 oz (152.6 kg)   SpO2 98%   BMI 45.62 kg/m  Wt Readings from Last 3 Encounters:  06/16/17 (!) 336 lb  6.4 oz (152.6 kg)  03/09/17 (!) 341 lb (154.7 kg)  12/07/16 (!) 342 lb 6.4 oz (155.3 kg)     Lab Results  Component Value Date   WBC 7.0 02/06/2013   HGB 15.4 02/06/2013   HCT 45.3 02/06/2013   PLT 234.0 02/06/2013   GLUCOSE 102 (H) 12/07/2016   CHOL 122 12/07/2016   TRIG 132.0 12/07/2016   HDL 41.80 12/07/2016   LDLDIRECT 148.7 03/17/2010   LDLCALC 53 12/07/2016   ALT 16 12/07/2016   AST 15 12/07/2016   NA 136 12/07/2016   K 3.8 12/07/2016     CL 100 12/07/2016   CREATININE 0.97 12/07/2016   BUN 13 12/07/2016   CO2 28 12/07/2016   TSH 0.75 01/27/2009   PSA 0.29 01/27/2009   HGBA1C 6.6 (H) 12/07/2016   MICROALBUR 1.0 04/29/2014    No results found.   Assessment & Plan:  Plan  I have discontinued Richard [name] "Richard [name]"'s ONETOUCH VERIO, glucose blood, HYDROcodone-homatropine, and amoxicillin-clavulanate. I am also having him maintain his liraglutide, metFORMIN, atorvastatin, ONE TOUCH ULTRA TEST, valsartan-hydrochlorothiazide, dapagliflozin propanediol, Insulin Pen Needle, and oxyCODONE-acetaminophen.  Meds ordered this encounter  Medications  . ONE TOUCH ULTRA TEST test strip    Sig: Check blood sugar once daily    Dispense:  100 each    Refill:  1  . valsartan-hydrochlorothiazide (DIOVAN HCT) 160-25 MG tablet    Sig: Take 1 tablet by mouth daily.    Dispense:  90 tablet    Refill:  1  . dapagliflozin propanediol (FARXIGA) 5 MG TABS tablet    Sig: Take 5 mg by mouth daily.    Dispense:  90 tablet    Refill:  1  . Insulin Pen Needle (B-D UF III MINI PEN NEEDLES) 31G X 5 MM MISC    Sig: USE AS DIRECTED    Dispense:  100 each    Refill:  1  . oxyCODONE-acetaminophen (ROXICET) 5-325 MG tablet    Sig: Take 1 tablet by mouth every 8 (eight) hours as needed for severe pain.    Dispense:  21 tablet    Refill:  0    Problem List Items Addressed This Visit      Unprioritized   DM (diabetes mellitus) type II uncontrolled, periph vascular disorder (HCC)   Relevant Medications   ONE TOUCH ULTRA TEST test strip   valsartan-hydrochlorothiazide (DIOVAN HCT) 160-25 MG tablet   dapagliflozin propanediol (FARXIGA) 5 MG TABS tablet   Insulin Pen Needle (B-D UF III MINI PEN NEEDLES) 31G X 5 MM MISC   Other Relevant Orders   Hemoglobin A1c   Lipid panel   Essential hypertension   Relevant Medications   valsartan-hydrochlorothiazide (DIOVAN HCT) 160-25 MG tablet   Other Relevant Orders   Comprehensive metabolic panel    Lipid panel    Other Visit Diagnoses    High risk medication use    -  Primary   Relevant Orders   Pain Mgmt, Profile 8 w/Conf, U   Primary osteoarthritis of left knee       Relevant Medications   oxyCODONE-acetaminophen (ROXICET) 5-325 MG tablet   Other Relevant Orders   Pain Mgmt, Profile 8 w/Conf, U      Follow-up: No Follow-up on file.  Ann Held, DO

## 2017-06-16 NOTE — Assessment & Plan Note (Signed)
Well controlled, no changes to meds. Encouraged heart healthy diet such as the DASH diet and exercise as tolerated.  °

## 2017-06-16 NOTE — Assessment & Plan Note (Signed)
hgba1c to be checked, minimize simple carbs. Increase exercise as tolerated. Continue current meds  

## 2017-06-16 NOTE — Assessment & Plan Note (Signed)
Tolerating statin, encouraged heart healthy diet, avoid trans fats, minimize simple carbs and saturated fats. Increase exercise as tolerated 

## 2017-06-16 NOTE — Patient Instructions (Signed)

## 2017-06-17 LAB — LIPID PANEL
CHOLESTEROL: 148 mg/dL (ref 0–200)
HDL: 49.4 mg/dL (ref >39.00)
LDL Cholesterol: 71 mg/dL (ref 0–99)
NonHDL: 98.55
TRIGLYCERIDES: 136 mg/dL (ref 0.0–149.0)
Total CHOL/HDL Ratio: 3
VLDL: 27.2 mg/dL (ref 0.0–40.0)

## 2017-06-17 LAB — HEMOGLOBIN A1C: HEMOGLOBIN A1C: 6.9 % — AB (ref 4.6–6.5)

## 2017-06-17 LAB — COMPREHENSIVE METABOLIC PANEL
ALT: 13 U/L (ref 0–53)
AST: 14 U/L (ref 0–37)
Albumin: 4.1 g/dL (ref 3.5–5.2)
Alkaline Phosphatase: 50 U/L (ref 39–117)
BUN: 12 mg/dL (ref 6–23)
CHLORIDE: 100 meq/L (ref 96–112)
CO2: 31 mEq/L (ref 19–32)
Calcium: 9.4 mg/dL (ref 8.4–10.5)
Creatinine, Ser: 0.87 mg/dL (ref 0.40–1.50)
GFR: 113.47 mL/min (ref >60.00)
GLUCOSE: 87 mg/dL (ref 70–99)
POTASSIUM: 4.4 meq/L (ref 3.5–5.1)
SODIUM: 139 meq/L (ref 135–145)
TOTAL PROTEIN: 7.5 g/dL (ref 6.0–8.3)
Total Bilirubin: 0.6 mg/dL (ref 0.2–1.2)

## 2017-06-18 LAB — PAIN MGMT, PROFILE 8 W/CONF, U
6 ACETYLMORPHINE: NEGATIVE ng/mL (ref <10)
Alcohol Metabolites: POSITIVE ng/mL — AB (ref <500)
Amphetamines: NEGATIVE ng/mL (ref <500)
BENZODIAZEPINES: NEGATIVE ng/mL (ref <100)
BUPRENORPHINE, URINE: NEGATIVE ng/mL (ref <5)
CREATININE: 116.6 mg/dL
Cocaine Metabolite: NEGATIVE ng/mL (ref <150)
ETHYL SULFATE (ETS): 223 ng/mL — AB (ref <100)
Ethyl Glucuronide (ETG): 879 ng/mL — ABNORMAL HIGH (ref <500)
MDMA: NEGATIVE ng/mL (ref <500)
Marijuana Metabolite: NEGATIVE ng/mL (ref <20)
OPIATES: NEGATIVE ng/mL (ref <100)
OXIDANT: NEGATIVE ug/mL (ref <200)
Oxycodone: NEGATIVE ng/mL (ref <100)
PH: 6.69 (ref 4.5–9.0)

## 2017-06-20 ENCOUNTER — Other Ambulatory Visit: Payer: Self-pay | Admitting: Family Medicine

## 2017-06-20 ENCOUNTER — Other Ambulatory Visit: Payer: Self-pay

## 2017-06-20 ENCOUNTER — Telehealth: Payer: Self-pay | Admitting: Family Medicine

## 2017-06-20 DIAGNOSIS — E1165 Type 2 diabetes mellitus with hyperglycemia: Principal | ICD-10-CM

## 2017-06-20 DIAGNOSIS — IMO0002 Reserved for concepts with insufficient information to code with codable children: Secondary | ICD-10-CM

## 2017-06-20 DIAGNOSIS — E1151 Type 2 diabetes mellitus with diabetic peripheral angiopathy without gangrene: Secondary | ICD-10-CM

## 2017-06-20 MED ORDER — OLMESARTAN MEDOXOMIL-HCTZ 20-12.5 MG PO TABS: 1.0000 | ORAL_TABLET | Freq: Every day | ORAL | 3 refills | Status: DC

## 2017-06-20 MED ORDER — INSULIN PEN NEEDLE 31G X 5 MM MISC: 1 refills | Status: DC

## 2017-06-20 NOTE — Telephone Encounter (Signed)
Rx sent in and Pt notified.

## 2017-06-20 NOTE — Telephone Encounter (Signed)
Copied from [place] [phone]. Topic: General - Other >> Jun 20, 2017  8:14 AM Darl Householder, RMA wrote: Reason for CRM: patient is calling to report that there is a recall on medication Valsartan 160-25 mg per pharmacy, patient is requesting a callback to change medication

## 2017-06-20 NOTE — Telephone Encounter (Signed)
D/c valsartan --- -benicar 20/12.5  1 po qd #30   bp check 2-3 weeks

## 2017-08-14 ENCOUNTER — Other Ambulatory Visit: Payer: Self-pay | Admitting: Family Medicine

## 2017-08-14 DIAGNOSIS — E1151 Type 2 diabetes mellitus with diabetic peripheral angiopathy without gangrene: Secondary | ICD-10-CM

## 2017-08-14 DIAGNOSIS — IMO0002 Reserved for concepts with insufficient information to code with codable children: Secondary | ICD-10-CM

## 2017-08-14 DIAGNOSIS — E1165 Type 2 diabetes mellitus with hyperglycemia: Principal | ICD-10-CM

## 2017-08-25 ENCOUNTER — Other Ambulatory Visit: Payer: Self-pay | Admitting: Family Medicine

## 2017-08-25 DIAGNOSIS — E1165 Type 2 diabetes mellitus with hyperglycemia: Principal | ICD-10-CM

## 2017-08-25 DIAGNOSIS — IMO0002 Reserved for concepts with insufficient information to code with codable children: Secondary | ICD-10-CM

## 2017-08-25 DIAGNOSIS — E1151 Type 2 diabetes mellitus with diabetic peripheral angiopathy without gangrene: Secondary | ICD-10-CM

## 2017-09-07 ENCOUNTER — Other Ambulatory Visit: Payer: Self-pay | Admitting: Family Medicine

## 2017-09-07 ENCOUNTER — Ambulatory Visit (INDEPENDENT_AMBULATORY_CARE_PROVIDER_SITE_OTHER): Payer: BC Managed Care – PPO | Admitting: *Deleted

## 2017-09-07 DIAGNOSIS — Z23 Encounter for immunization: Secondary | ICD-10-CM | POA: Diagnosis not present

## 2017-09-07 DIAGNOSIS — M1712 Unilateral primary osteoarthritis, left knee: Secondary | ICD-10-CM

## 2017-09-07 NOTE — Progress Notes (Signed)
Pre visit review using our clinic review tool, if applicable. No additional management support is needed unless otherwise documented below in the visit note.  Pt here for 2nd hepatitis A vaccine per order of Dr Carollee Herter.  Hep A given IM, right deltoid and pt tolerated injection well.

## 2017-09-07 NOTE — Telephone Encounter (Signed)
Copied from [place] [id number]. Topic: Quick Communication - See Telephone Encounter >> Sep 07, 2017  2:26 PM Rosalin Hawking wrote: CRM for notification. See Telephone encounter for: 09/07/17.   Pt stated is needing refill on oxyCODONE-acetaminophen (ROXICET) 5-325 MG tablet. Please advise ASAp

## 2017-09-09 MED ORDER — OXYCODONE-ACETAMINOPHEN 5-325 MG PO TABS: 1.0000 | ORAL_TABLET | Freq: Three times a day (TID) | ORAL | 0 refills | Status: DC | PRN

## 2017-09-09 NOTE — Telephone Encounter (Signed)
Requesting:oxycodone  Contract:yes BDZ:[id number] risk next screen 4/3//19 Last OV:06/16/17 Next OV:not scheduled  Last Refill:06/16/17  #[id number] Database:no concerns   Please advise

## 2017-09-13 NOTE — Telephone Encounter (Signed)
rx sent in on 09/09/17 and patient did pick up.

## 2017-10-05 ENCOUNTER — Other Ambulatory Visit: Payer: Self-pay | Admitting: Family Medicine

## 2017-10-05 DIAGNOSIS — R059 Cough, unspecified: Secondary | ICD-10-CM

## 2017-10-05 DIAGNOSIS — R05 Cough: Secondary | ICD-10-CM

## 2017-10-05 MED ORDER — OLMESARTAN MEDOXOMIL-HCTZ 20-12.5 MG PO TABS: 1.0000 | ORAL_TABLET | Freq: Every day | ORAL | 3 refills | Status: DC

## 2017-10-05 NOTE — Telephone Encounter (Signed)
Copied from [place] [phone]. Topic: Quick Communication - Rx Refill/Question >> Oct 05, 2017  6:36 AM Yvette Rack wrote: Medication: Hydromet for his cough at night was given to him last year for a cough  olmesartan-hydrochlorothiazide (BENICAR HCT) 20-12.5 MG tablet 90 day supply  Has the patient contacted their pharmacy? Yes.   (Agent: If no, request that the patient contact the pharmacy for the refill.) Preferred Pharmacy (with phone number or street name): [id number] - [place], [place] [phone] (Phone) [phone] (Fax)  Agent: Please be advised that RX refills may take up to 3 business days. We ask that you follow-up with your pharmacy.

## 2017-10-05 NOTE — Telephone Encounter (Signed)
Called pt. To ask why he needs cough medicine refilled. Tried both numbers - unable to leave a message on either one.

## 2017-10-05 NOTE — Telephone Encounter (Signed)
Patient is requesting a refill on Hydromet for his cough. He was called and asked about the cough. He says it is a dry cough at night, he doesn't have a fever or any other symptoms. He says he uses it at night when he coughs. I advised I would send this to the provider for review.  Hydromet refill Last OV: 06/16/2017 (discontinued by provider this date) Pharmacy: [id number] - [place], [place] [phone] (Phone) [phone] (Fax)

## 2017-10-11 MED ORDER — HYDROCODONE-HOMATROPINE 5-1.5 MG/5ML PO SYRP: 5.0000 mL | ORAL_SOLUTION | Freq: Three times a day (TID) | ORAL | 0 refills | Status: DC | PRN

## 2017-10-13 ENCOUNTER — Other Ambulatory Visit: Payer: Self-pay | Admitting: Family Medicine

## 2017-11-12 ENCOUNTER — Other Ambulatory Visit: Payer: Self-pay | Admitting: Family Medicine

## 2017-11-12 DIAGNOSIS — E1165 Type 2 diabetes mellitus with hyperglycemia: Principal | ICD-10-CM

## 2017-11-12 DIAGNOSIS — E1151 Type 2 diabetes mellitus with diabetic peripheral angiopathy without gangrene: Secondary | ICD-10-CM

## 2017-11-12 DIAGNOSIS — IMO0002 Reserved for concepts with insufficient information to code with codable children: Secondary | ICD-10-CM

## 2017-11-21 ENCOUNTER — Other Ambulatory Visit: Payer: Self-pay | Admitting: Family Medicine

## 2017-11-21 DIAGNOSIS — E1165 Type 2 diabetes mellitus with hyperglycemia: Principal | ICD-10-CM

## 2017-11-21 DIAGNOSIS — E1151 Type 2 diabetes mellitus with diabetic peripheral angiopathy without gangrene: Secondary | ICD-10-CM

## 2017-11-21 DIAGNOSIS — IMO0002 Reserved for concepts with insufficient information to code with codable children: Secondary | ICD-10-CM

## 2017-12-05 ENCOUNTER — Ambulatory Visit: Payer: BC Managed Care – PPO | Admitting: Family Medicine

## 2017-12-05 ENCOUNTER — Encounter: Payer: Self-pay | Admitting: Family Medicine

## 2017-12-05 VITALS — BP 138/70 | HR 95 | Temp 98.5°F | Resp 16 | Ht 72.0 in | Wt 346.6 lb

## 2017-12-05 DIAGNOSIS — I10 Essential (primary) hypertension: Secondary | ICD-10-CM

## 2017-12-05 DIAGNOSIS — E1165 Type 2 diabetes mellitus with hyperglycemia: Secondary | ICD-10-CM | POA: Diagnosis not present

## 2017-12-05 DIAGNOSIS — E785 Hyperlipidemia, unspecified: Secondary | ICD-10-CM

## 2017-12-05 DIAGNOSIS — Z23 Encounter for immunization: Secondary | ICD-10-CM

## 2017-12-05 DIAGNOSIS — M1712 Unilateral primary osteoarthritis, left knee: Secondary | ICD-10-CM

## 2017-12-05 DIAGNOSIS — IMO0002 Reserved for concepts with insufficient information to code with codable children: Secondary | ICD-10-CM

## 2017-12-05 DIAGNOSIS — E1151 Type 2 diabetes mellitus with diabetic peripheral angiopathy without gangrene: Secondary | ICD-10-CM

## 2017-12-05 LAB — COMPREHENSIVE METABOLIC PANEL
ALBUMIN: 4.3 g/dL (ref 3.5–5.2)
ALK PHOS: 49 U/L (ref 39–117)
ALT: 17 U/L (ref 0–53)
AST: 17 U/L (ref 0–37)
BUN: 19 mg/dL (ref 6–23)
CALCIUM: 9.8 mg/dL (ref 8.4–10.5)
CHLORIDE: 98 meq/L (ref 96–112)
CO2: 29 mEq/L (ref 19–32)
CREATININE: 0.99 mg/dL (ref 0.40–1.50)
GFR: 97.6 mL/min (ref >60.00)
Glucose, Bld: 105 mg/dL — ABNORMAL HIGH (ref 70–99)
POTASSIUM: 4.6 meq/L (ref 3.5–5.1)
Sodium: 136 mEq/L (ref 135–145)
TOTAL PROTEIN: 7.7 g/dL (ref 6.0–8.3)
Total Bilirubin: 0.6 mg/dL (ref 0.2–1.2)

## 2017-12-05 LAB — LIPID PANEL
CHOL/HDL RATIO: 4
CHOLESTEROL: 221 mg/dL — AB (ref 0–200)
HDL: 55.5 mg/dL (ref >39.00)
NonHDL: 165.95
TRIGLYCERIDES: 248 mg/dL — AB (ref 0.0–149.0)
VLDL: 49.6 mg/dL — AB (ref 0.0–40.0)

## 2017-12-05 LAB — LDL CHOLESTEROL, DIRECT: Direct LDL: 133 mg/dL

## 2017-12-05 LAB — HEMOGLOBIN A1C: Hgb A1c MFr Bld: 6.8 % — ABNORMAL HIGH (ref 4.6–6.5)

## 2017-12-05 MED ORDER — OLMESARTAN MEDOXOMIL-HCTZ 20-12.5 MG PO TABS: 1.0000 | ORAL_TABLET | Freq: Every day | ORAL | 1 refills | Status: DC

## 2017-12-05 MED ORDER — OXYCODONE-ACETAMINOPHEN 5-325 MG PO TABS: 1.0000 | ORAL_TABLET | Freq: Three times a day (TID) | ORAL | 0 refills | Status: DC | PRN

## 2017-12-05 MED ORDER — INSULIN PEN NEEDLE 31G X 5 MM MISC: 1 refills | Status: DC

## 2017-12-05 MED ORDER — LIRAGLUTIDE 18 MG/3ML ~~LOC~~ SOPN: PEN_INJECTOR | SUBCUTANEOUS | 1 refills | Status: DC

## 2017-12-05 MED ORDER — METFORMIN HCL ER 500 MG PO TB24: ORAL_TABLET | ORAL | 1 refills | Status: DC

## 2017-12-05 MED ORDER — DAPAGLIFLOZIN PROPANEDIOL 5 MG PO TABS: 5.0000 mg | ORAL_TABLET | Freq: Every day | ORAL | 1 refills | Status: DC

## 2017-12-05 MED ORDER — GLUCOSE BLOOD VI STRP: ORAL_STRIP | 1 refills | Status: DC

## 2017-12-05 NOTE — Progress Notes (Addendum)
Check labs.  Adjust meds prn Patient ID: Richard [name], male    DOB: [birth year]  Age: 67 y.o. MRN: [id number]    Subjective:  Subjective  HPI Richard [name] presents for f/u dm, cholesterol and htn.  HYPERTENSION   Blood pressure range-not checking   Chest pain- no      Dyspnea- no Lightheadedness- no   Edema- no  Other side effects - no   Medication compliance: good Low salt diet- yes    DIABETES    Blood Sugar ranges-114-168  Polyuria- no New Visual problems- no  Hypoglycemic symptoms- no  Other side effects-no Medication compliance - good Last eye exam- 05/2017 Foot exam- today   HYPERLIPIDEMIA  Medication compliance- good RUQ pain- no  Muscle aches- no Other side effects-no   Review of Systems  Constitutional: Negative for chills and fever.  HENT: Negative for congestion and hearing loss.   Eyes: Negative for discharge.  Respiratory: Negative for cough and shortness of breath.   Cardiovascular: Negative for chest pain, palpitations and leg swelling.  Gastrointestinal: Negative for abdominal pain, blood in stool, constipation, diarrhea, nausea and vomiting.  Genitourinary: Negative for dysuria, frequency, hematuria and urgency.  Musculoskeletal: Negative for back pain and myalgias.  Skin: Negative for rash.  Allergic/Immunologic: Negative for environmental allergies.  Neurological: Negative for dizziness, weakness and headaches.  Hematological: Does not bruise/bleed easily.  Psychiatric/Behavioral: Negative for suicidal ideas. The patient is not nervous/anxious.     History Past Medical History:  Diagnosis Date  . Allergy    seasonal  . Arthritis     left knee,  right hip  . Complication of anesthesia    half awake during last colonscopy  . Diabetes mellitus    type 2  . Hx of adenomatous colonic polyps    2010 6 mm adenoma 10/06/2016 2 5 mm descending polyps   . Hyperlipidemia   . Hypertension     He has a past surgical history that includes  Colonoscopy and Colonoscopy with propofol (N/A, 10/06/2016).   His family history includes Diabetes in his brother, mother, and sister; Hypertension in his sister; Stroke in his sister.He reports that he has never smoked. He has never used smokeless tobacco. He reports that he drinks alcohol. He reports that he does not use drugs.  Current Outpatient Medications on File Prior to Visit  Medication Sig Dispense Refill  . atorvastatin (LIPITOR) 40 MG tablet Take 1 tablet (40 mg total) by mouth at bedtime. 90 tablet 0  . HYDROcodone-homatropine (HYCODAN) 5-1.5 MG/5ML syrup Take 5 mLs by mouth every 8 (eight) hours as needed for cough. 120 mL 0   No current facility-administered medications on file prior to visit.      Objective:  Objective  Physical Exam  Constitutional: He is oriented to person, place, and time. Vital signs are normal. He appears well-developed and well-nourished. He is sleeping.  HENT:  Head: Normocephalic and atraumatic.  Mouth/Throat: Oropharynx is clear and moist.  Eyes: Pupils are equal, round, and reactive to light. EOM are normal.  Neck: Normal range of motion. Neck supple. No thyromegaly present.  Cardiovascular: Normal rate and regular rhythm.  No murmur heard. Pulmonary/Chest: Effort normal and breath sounds normal. No respiratory distress. He has no wheezes. He has no rales. He exhibits no tenderness.  Musculoskeletal: He exhibits no edema or tenderness.  Neurological: He is alert and oriented to person, place, and time.  Skin: Skin is warm and dry.  Psychiatric: He has a normal mood and  affect. His behavior is normal. Judgment and thought content normal.  Nursing note and vitals reviewed.  Diabetic Foot Exam - Simple   Simple Foot Form Diabetic Foot exam was performed with the following findings:  Yes 12/05/2017 12:09 PM  Visual Inspection No deformities, no ulcerations, no other skin breakdown bilaterally:  Yes Sensation Testing Intact to touch and  monofilament testing bilaterally:  Yes Pulse Check Posterior Tibialis and Dorsalis pulse intact bilaterally:  Yes Comments     BP 138/70 (BP Location: Right Arm, Cuff Size: Large)   Pulse 95   Temp 98.5 F (36.9 C) (Oral)   Resp 16   Ht 6' (1.829 m)   Wt (!) 346 lb 9.6 oz (157.2 kg)   SpO2 97%   BMI 47.01 kg/m  Wt Readings from Last 3 Encounters:  12/05/17 (!) 346 lb 9.6 oz (157.2 kg)  06/16/17 (!) 336 lb 6.4 oz (152.6 kg)  03/09/17 (!) 341 lb (154.7 kg)     Lab Results  Component Value Date   WBC 7.0 02/06/2013   HGB 15.4 02/06/2013   HCT 45.3 02/06/2013   PLT 234.0 02/06/2013   GLUCOSE 87 06/16/2017   CHOL 148 06/16/2017   TRIG 136.0 06/16/2017   HDL 49.40 06/16/2017   LDLDIRECT 148.7 03/17/2010   LDLCALC 71 06/16/2017   ALT 13 06/16/2017   AST 14 06/16/2017   NA 139 06/16/2017   K 4.4 06/16/2017   CL 100 06/16/2017   CREATININE 0.87 06/16/2017   BUN 12 06/16/2017   CO2 31 06/16/2017   TSH 0.75 01/27/2009   PSA 0.29 01/27/2009   HGBA1C 6.9 (H) 06/16/2017   MICROALBUR 1.0 04/29/2014    No results found.   Assessment & Plan:  Plan  I have changed Richard [name] "Richard [name]"'s VICTOZA to liraglutide. I am also having him maintain his atorvastatin, HYDROcodone-homatropine, olmesartan-hydrochlorothiazide, metFORMIN, dapagliflozin propanediol, Insulin Pen Needle, oxyCODONE-acetaminophen, and glucose blood.  Meds ordered this encounter  Medications  . olmesartan-hydrochlorothiazide (BENICAR HCT) 20-12.5 MG tablet    Sig: Take 1 tablet by mouth daily.    Dispense:  90 tablet    Refill:  1  . metFORMIN (GLUCOPHAGE-XR) 500 MG 24 hr tablet    Sig: TAKE 2 TABLETS BY MOUTH TWICE A DAY WITH FOOD    Dispense:  360 tablet    Refill:  1  . dapagliflozin propanediol (FARXIGA) 5 MG TABS tablet    Sig: Take 5 mg by mouth daily.    Dispense:  90 tablet    Refill:  1  . liraglutide (VICTOZA) 18 MG/3ML SOPN    Sig: INJECT 1.8MG SUBCUTANEOUSLY EVERY DAY    Dispense:  9  mL    Refill:  1    DX Code Needed  .  Marland Kitchen Insulin Pen Needle (B-D UF III MINI PEN NEEDLES) 31G X 5 MM MISC    Sig: USE AS DIRECTED    Dispense:  100 each    Refill:  1  . oxyCODONE-acetaminophen (ROXICET) 5-325 MG tablet    Sig: Take 1 tablet by mouth every 8 (eight) hours as needed for severe pain.    Dispense:  21 tablet    Refill:  0  . glucose blood (ONE TOUCH ULTRA TEST) test strip    Sig: TEST BLOOD SUGAR ONCE DAILY    Dispense:  100 each    Refill:  1    Problem List Items Addressed This Visit      Unprioritized   DM (diabetes mellitus) type  II uncontrolled, periph vascular disorder ([place])    hgba1c to be checked, minimize simple carbs. Increase exercise as tolerated. Continue current meds       Relevant Medications   olmesartan-hydrochlorothiazide (BENICAR HCT) 20-12.5 MG tablet   metFORMIN (GLUCOPHAGE-XR) 500 MG 24 hr tablet   dapagliflozin propanediol (FARXIGA) 5 MG TABS tablet   liraglutide (VICTOZA) 18 MG/3ML SOPN   Insulin Pen Needle (B-D UF III MINI PEN NEEDLES) 31G X 5 MM MISC   glucose blood (ONE TOUCH ULTRA TEST) test strip   Other Relevant Orders   Hemoglobin A1c   Comprehensive metabolic panel   Essential hypertension - Primary    Well controlled, no changes to meds. Encouraged heart healthy diet such as the DASH diet and exercise as tolerated.       Relevant Medications   olmesartan-hydrochlorothiazide (BENICAR HCT) 20-12.5 MG tablet   Other Relevant Orders   Comprehensive metabolic panel   Hyperlipidemia LDL goal <70    Tolerating statin, encouraged heart healthy diet, avoid trans fats, minimize simple carbs and saturated fats. Increase exercise as tolerated      Relevant Medications   olmesartan-hydrochlorothiazide (BENICAR HCT) 20-12.5 MG tablet   Other Relevant Orders   Comprehensive metabolic panel   Lipid panel    Other Visit Diagnoses    Primary osteoarthritis of left knee       Relevant Medications   oxyCODONE-acetaminophen (ROXICET)  5-325 MG tablet   Other Relevant Orders   Pain Mgmt, Profile 8 w/Conf, U   Need for MMR vaccine       Relevant Orders   Measles/Mumps/Rubella Immunity      Follow-up: Return in about 6 months (around 06/06/2018).  Ann Held, DO

## 2017-12-05 NOTE — Patient Instructions (Signed)

## 2017-12-05 NOTE — Assessment & Plan Note (Signed)
Tolerating statin, encouraged heart healthy diet, avoid trans fats, minimize simple carbs and saturated fats. Increase exercise as tolerated 

## 2017-12-05 NOTE — Assessment & Plan Note (Signed)
Well controlled, no changes to meds. Encouraged heart healthy diet such as the DASH diet and exercise as tolerated.  °

## 2017-12-05 NOTE — Assessment & Plan Note (Signed)
hgba1c to be checked, minimize simple carbs. Increase exercise as tolerated. Continue current meds  

## 2017-12-08 ENCOUNTER — Other Ambulatory Visit: Payer: Self-pay

## 2017-12-08 DIAGNOSIS — E785 Hyperlipidemia, unspecified: Secondary | ICD-10-CM

## 2017-12-08 DIAGNOSIS — E1165 Type 2 diabetes mellitus with hyperglycemia: Secondary | ICD-10-CM

## 2017-12-08 DIAGNOSIS — I10 Essential (primary) hypertension: Secondary | ICD-10-CM

## 2017-12-08 LAB — PAIN MGMT, PROFILE 8 W/CONF, U
6 ACETYLMORPHINE: NEGATIVE ng/mL (ref <10)
Alcohol Metabolites: POSITIVE ng/mL — AB (ref <500)
Amphetamines: NEGATIVE ng/mL (ref <500)
BENZODIAZEPINES: NEGATIVE ng/mL (ref <100)
Buprenorphine, Urine: NEGATIVE ng/mL (ref <5)
COCAINE METABOLITE: NEGATIVE ng/mL (ref <150)
CREATININE: 71.6 mg/dL
Ethyl Glucuronide (ETG): 4941 ng/mL — ABNORMAL HIGH (ref <500)
Ethyl Sulfate (ETS): 1586 ng/mL — ABNORMAL HIGH (ref <100)
MDMA: NEGATIVE ng/mL (ref <500)
Marijuana Metabolite: NEGATIVE ng/mL (ref <20)
OPIATES: NEGATIVE ng/mL (ref <100)
OXYCODONE: NEGATIVE ng/mL (ref <100)
Oxidant: NEGATIVE ug/mL (ref <200)
pH: 6.18 (ref 4.5–9.0)

## 2017-12-08 LAB — MEASLES/MUMPS/RUBELLA IMMUNITY
Mumps IgG: 264 AU/mL
RUBELLA: 4.38 {index}
Rubeola IgG: >300 AU/mL

## 2017-12-08 MED ORDER — FENOFIBRATE 160 MG PO TABS: 160.0000 mg | ORAL_TABLET | Freq: Every day | ORAL | 2 refills | Status: DC

## 2018-01-22 ENCOUNTER — Other Ambulatory Visit: Payer: Self-pay | Admitting: Family Medicine

## 2018-02-20 ENCOUNTER — Other Ambulatory Visit: Payer: Self-pay | Admitting: Family Medicine

## 2018-02-20 DIAGNOSIS — E1151 Type 2 diabetes mellitus with diabetic peripheral angiopathy without gangrene: Secondary | ICD-10-CM

## 2018-02-20 DIAGNOSIS — E1165 Type 2 diabetes mellitus with hyperglycemia: Principal | ICD-10-CM

## 2018-02-20 DIAGNOSIS — IMO0002 Reserved for concepts with insufficient information to code with codable children: Secondary | ICD-10-CM

## 2018-03-03 ENCOUNTER — Other Ambulatory Visit: Payer: Self-pay | Admitting: Family Medicine

## 2018-04-14 ENCOUNTER — Other Ambulatory Visit: Payer: Self-pay | Admitting: Family Medicine

## 2018-04-14 DIAGNOSIS — E1165 Type 2 diabetes mellitus with hyperglycemia: Principal | ICD-10-CM

## 2018-04-14 DIAGNOSIS — IMO0002 Reserved for concepts with insufficient information to code with codable children: Secondary | ICD-10-CM

## 2018-04-14 DIAGNOSIS — E1151 Type 2 diabetes mellitus with diabetic peripheral angiopathy without gangrene: Secondary | ICD-10-CM

## 2018-05-15 ENCOUNTER — Ambulatory Visit: Payer: BC Managed Care – PPO | Admitting: Family Medicine

## 2018-05-15 ENCOUNTER — Encounter: Payer: Self-pay | Admitting: Family Medicine

## 2018-05-15 VITALS — BP 162/68 | HR 75 | Temp 98.7°F | Resp 16 | Ht 72.0 in | Wt 361.8 lb

## 2018-05-15 DIAGNOSIS — R059 Cough, unspecified: Secondary | ICD-10-CM

## 2018-05-15 DIAGNOSIS — Z23 Encounter for immunization: Secondary | ICD-10-CM | POA: Diagnosis not present

## 2018-05-15 DIAGNOSIS — M1712 Unilateral primary osteoarthritis, left knee: Secondary | ICD-10-CM

## 2018-05-15 DIAGNOSIS — I10 Essential (primary) hypertension: Secondary | ICD-10-CM

## 2018-05-15 DIAGNOSIS — R05 Cough: Secondary | ICD-10-CM | POA: Diagnosis not present

## 2018-05-15 DIAGNOSIS — Z79899 Other long term (current) drug therapy: Secondary | ICD-10-CM

## 2018-05-15 DIAGNOSIS — E785 Hyperlipidemia, unspecified: Secondary | ICD-10-CM | POA: Diagnosis not present

## 2018-05-15 DIAGNOSIS — E1151 Type 2 diabetes mellitus with diabetic peripheral angiopathy without gangrene: Secondary | ICD-10-CM

## 2018-05-15 DIAGNOSIS — E1165 Type 2 diabetes mellitus with hyperglycemia: Secondary | ICD-10-CM | POA: Diagnosis not present

## 2018-05-15 DIAGNOSIS — IMO0002 Reserved for concepts with insufficient information to code with codable children: Secondary | ICD-10-CM

## 2018-05-15 LAB — COMPREHENSIVE METABOLIC PANEL
ALBUMIN: 4.3 g/dL (ref 3.5–5.2)
ALT: 16 U/L (ref 0–53)
AST: 15 U/L (ref 0–37)
Alkaline Phosphatase: 46 U/L (ref 39–117)
BILIRUBIN TOTAL: 0.7 mg/dL (ref 0.2–1.2)
BUN: 14 mg/dL (ref 6–23)
CO2: 27 mEq/L (ref 19–32)
CREATININE: 0.94 mg/dL (ref 0.40–1.50)
Calcium: 9.4 mg/dL (ref 8.4–10.5)
Chloride: 100 mEq/L (ref 96–112)
GFR: 103.48 mL/min (ref >60.00)
Glucose, Bld: 103 mg/dL — ABNORMAL HIGH (ref 70–99)
Potassium: 4.2 mEq/L (ref 3.5–5.1)
SODIUM: 137 meq/L (ref 135–145)
TOTAL PROTEIN: 7.4 g/dL (ref 6.0–8.3)

## 2018-05-15 LAB — HEMOGLOBIN A1C: Hgb A1c MFr Bld: 7 % — ABNORMAL HIGH (ref 4.6–6.5)

## 2018-05-15 LAB — LIPID PANEL
CHOLESTEROL: 220 mg/dL — AB (ref 0–200)
HDL: 49.8 mg/dL (ref >39.00)
LDL Cholesterol: 148 mg/dL — ABNORMAL HIGH (ref 0–99)
NonHDL: 170.25
TRIGLYCERIDES: 113 mg/dL (ref 0.0–149.0)
Total CHOL/HDL Ratio: 4
VLDL: 22.6 mg/dL (ref 0.0–40.0)

## 2018-05-15 MED ORDER — INSULIN PEN NEEDLE 31G X 5 MM MISC: 1 refills | Status: DC

## 2018-05-15 MED ORDER — LIRAGLUTIDE 18 MG/3ML ~~LOC~~ SOPN: PEN_INJECTOR | SUBCUTANEOUS | 5 refills | Status: DC

## 2018-05-15 MED ORDER — DAPAGLIFLOZIN PROPANEDIOL 5 MG PO TABS: 5.0000 mg | ORAL_TABLET | Freq: Every day | ORAL | 1 refills | Status: DC

## 2018-05-15 MED ORDER — METFORMIN HCL ER 500 MG PO TB24: ORAL_TABLET | ORAL | 1 refills | Status: DC

## 2018-05-15 MED ORDER — FENOFIBRATE 160 MG PO TABS: 160.0000 mg | ORAL_TABLET | Freq: Every day | ORAL | 1 refills | Status: DC

## 2018-05-15 MED ORDER — ATORVASTATIN CALCIUM 40 MG PO TABS: 40.0000 mg | ORAL_TABLET | Freq: Every day | ORAL | 1 refills | Status: DC

## 2018-05-15 MED ORDER — OXYCODONE-ACETAMINOPHEN 5-325 MG PO TABS: 1.0000 | ORAL_TABLET | Freq: Three times a day (TID) | ORAL | 0 refills | Status: DC | PRN

## 2018-05-15 MED ORDER — GLUCOSE BLOOD VI STRP: ORAL_STRIP | 1 refills | Status: DC

## 2018-05-15 MED ORDER — HYDROCODONE-HOMATROPINE 5-1.5 MG/5ML PO SYRP: 5.0000 mL | ORAL_SOLUTION | Freq: Three times a day (TID) | ORAL | 0 refills | Status: DC | PRN

## 2018-05-15 MED ORDER — OLMESARTAN MEDOXOMIL-HCTZ 20-12.5 MG PO TABS: 1.0000 | ORAL_TABLET | Freq: Every day | ORAL | 1 refills | Status: DC

## 2018-05-15 MED ORDER — OLMESARTAN MEDOXOMIL-HCTZ 40-25 MG PO TABS: 1.0000 | ORAL_TABLET | Freq: Every day | ORAL | 2 refills | Status: DC

## 2018-05-15 NOTE — Progress Notes (Signed)
Patient ID: Richard [name], male    DOB: 1950-08-21  Age: 67 y.o. MRN: [id number]    Subjective:  Subjective  HPI Willliam Pettet presents for f/u dm, chol and bp.   HYPERTENSION   Blood pressure range-not checking   Chest pain- no      Dyspnea- no Lightheadedness- no   Edema- no  Other side effects - no   Medication compliance: good Low salt diet- no    DIABETES    Blood Sugar ranges-100-150  Polyuria- no New Visual problems- no  Hypoglycemic symptoms- no  Other side effects-no Medication compliance -- no  Foot exam- today   HYPERLIPIDEMIA  Medication compliance- no RUQ pain- no  Muscle aches- no Other side effects-no     Review of Systems  Constitutional: Negative for appetite change, diaphoresis, fatigue and unexpected weight change.  Eyes: Negative for pain, redness and visual disturbance.  Respiratory: Negative for cough, chest tightness, shortness of breath and wheezing.   Cardiovascular: Negative for chest pain, palpitations and leg swelling.  Endocrine: Negative for cold intolerance, heat intolerance, polydipsia, polyphagia and polyuria.  Genitourinary: Negative for difficulty urinating, dysuria and frequency.  Neurological: Negative for dizziness, light-headedness, numbness and headaches.    History Past Medical History:  Diagnosis Date  . Allergy    seasonal  . Arthritis     left knee,  right hip  . Complication of anesthesia    half awake during last colonscopy  . Diabetes mellitus    type 2  . Hx of adenomatous colonic polyps    2010 6 mm adenoma 10/06/2016 2 5 mm descending polyps   . Hyperlipidemia   . Hypertension     He has a past surgical history that includes Colonoscopy and Colonoscopy with propofol (N/A, 10/06/2016).   His family history includes Diabetes in his brother, mother, and sister; Hypertension in his sister; Stroke in his sister.He reports that he has never smoked. He has never used smokeless tobacco. He reports that he drinks  alcohol. He reports that he does not use drugs.  No current outpatient medications on file prior to visit.   No current facility-administered medications on file prior to visit.      Objective:  Objective  Physical Exam  Constitutional: He is oriented to person, place, and time. Vital signs are normal. He appears well-developed and well-nourished. He is sleeping.  HENT:  Head: Normocephalic and atraumatic.  Mouth/Throat: Oropharynx is clear and moist.  Eyes: Pupils are equal, round, and reactive to light. EOM are normal.  Neck: Normal range of motion. Neck supple. No thyromegaly present.  Cardiovascular: Normal rate and regular rhythm.  No murmur heard. Pulmonary/Chest: Effort normal and breath sounds normal. No respiratory distress. He has no wheezes. He has no rales. He exhibits no tenderness.  Musculoskeletal: He exhibits no edema or tenderness.  Neurological: He is alert and oriented to person, place, and time.  Skin: Skin is warm and dry.  Psychiatric: He has a normal mood and affect. His behavior is normal. Judgment and thought content normal.  Nursing note and vitals reviewed.  BP (!) 162/68   Pulse 75   Temp 98.7 F (37.1 C) (Oral)   Resp 16   Ht 6' (1.829 m)   Wt (!) 361 lb 12.8 oz (164.1 kg)   SpO2 99%   BMI 49.07 kg/m  Wt Readings from Last 3 Encounters:  05/15/18 (!) 361 lb 12.8 oz (164.1 kg)  12/05/17 (!) 346 lb 9.6 oz (157.2 kg)  06/16/17 Marland Kitchen)  336 lb 6.4 oz (152.6 kg)     Lab Results  Component Value Date   WBC 7.0 02/06/2013   HGB 15.4 02/06/2013   HCT 45.3 02/06/2013   PLT 234.0 02/06/2013   GLUCOSE 105 (H) 12/05/2017   CHOL 221 (H) 12/05/2017   TRIG 248.0 (H) 12/05/2017   HDL 55.50 12/05/2017   LDLDIRECT 133.0 12/05/2017   LDLCALC 71 06/16/2017   ALT 17 12/05/2017   AST 17 12/05/2017   NA 136 12/05/2017   K 4.6 12/05/2017   CL 98 12/05/2017   CREATININE 0.99 12/05/2017   BUN 19 12/05/2017   CO2 29 12/05/2017   TSH 0.75 01/27/2009   PSA  0.29 01/27/2009   HGBA1C 6.8 (H) 12/05/2017   MICROALBUR 1.0 04/29/2014    No results found.   Assessment & Plan:  Plan  I have discontinued [name]'s olmesartan-hydrochlorothiazide and olmesartan-hydrochlorothiazide. I am also having him start on olmesartan-hydrochlorothiazide. Additionally, I am having him maintain his HYDROcodone-homatropine, oxyCODONE-acetaminophen, metFORMIN, liraglutide, Insulin Pen Needle, glucose blood, fenofibrate, dapagliflozin propanediol, and atorvastatin.  Meds ordered this encounter  Medications  . DISCONTD: olmesartan-hydrochlorothiazide (BENICAR HCT) 20-12.5 MG tablet    Sig: Take 1 tablet by mouth daily.    Dispense:  90 tablet    Refill:  1  . HYDROcodone-homatropine (HYCODAN) 5-1.5 MG/5ML syrup    Sig: Take 5 mLs by mouth every 8 (eight) hours as needed for cough.    Dispense:  120 mL    Refill:  0  . DISCONTD: metFORMIN (GLUCOPHAGE-XR) 500 MG 24 hr tablet    Sig: TAKE 2 TABLETS BY MOUTH TWICE A DAY WITH FOOD    Dispense:  360 tablet    Refill:  1  . DISCONTD: glucose blood (ONE TOUCH ULTRA TEST) test strip    Sig: TEST BLOOD SUGAR ONCE DAILY.  Dx code: E11.9    Dispense:  100 each    Refill:  1  . DISCONTD: fenofibrate 160 MG tablet    Sig: Take 1 tablet (160 mg total) by mouth daily.    Dispense:  90 tablet    Refill:  1  . DISCONTD: atorvastatin (LIPITOR) 40 MG tablet    Sig: Take 1 tablet (40 mg total) by mouth at bedtime.    Dispense:  90 tablet    Refill:  1  . DISCONTD: liraglutide (VICTOZA) 18 MG/3ML SOPN    Sig: INJECT 1.8 MG UNDER THE SKIN ONCE DAILY    Dispense:  9 mL    Refill:  5  . oxyCODONE-acetaminophen (ROXICET) 5-325 MG tablet    Sig: Take 1 tablet by mouth every 8 (eight) hours as needed for severe pain.    Dispense:  21 tablet    Refill:  0  . DISCONTD: Insulin Pen Needle (B-D UF III MINI PEN NEEDLES) 31G X 5 MM MISC    Sig: USE AS DIRECTED    Dispense:  100 each    Refill:  1  . DISCONTD:  dapagliflozin propanediol (FARXIGA) 5 MG TABS tablet    Sig: Take 5 mg by mouth daily.    Dispense:  90 tablet    Refill:  1  . olmesartan-hydrochlorothiazide (BENICAR HCT) 40-25 MG tablet    Sig: Take 1 tablet by mouth daily.    Dispense:  30 tablet    Refill:  2    Cancel benicar hct  20/12.5  . metFORMIN (GLUCOPHAGE-XR) 500 MG 24 hr tablet    Sig: TAKE 2 TABLETS BY MOUTH  TWICE A DAY WITH FOOD    Dispense:  360 tablet    Refill:  1  . liraglutide (VICTOZA) 18 MG/3ML SOPN    Sig: INJECT 1.8 MG UNDER THE SKIN ONCE DAILY    Dispense:  9 mL    Refill:  5  . Insulin Pen Needle (B-D UF III MINI PEN NEEDLES) 31G X 5 MM MISC    Sig: USE AS DIRECTED    Dispense:  100 each    Refill:  1  . glucose blood (ONE TOUCH ULTRA TEST) test strip    Sig: TEST BLOOD SUGAR ONCE DAILY.  Dx code: E11.9    Dispense:  100 each    Refill:  1  . fenofibrate 160 MG tablet    Sig: Take 1 tablet (160 mg total) by mouth daily.    Dispense:  90 tablet    Refill:  1  . dapagliflozin propanediol (FARXIGA) 5 MG TABS tablet    Sig: Take 5 mg by mouth daily.    Dispense:  90 tablet    Refill:  1  . atorvastatin (LIPITOR) 40 MG tablet    Sig: Take 1 tablet (40 mg total) by mouth at bedtime.    Dispense:  90 tablet    Refill:  1    Problem List Items Addressed This Visit      Unprioritized   DM (diabetes mellitus) type II uncontrolled, periph vascular disorder (HCC)   Relevant Medications   olmesartan-hydrochlorothiazide (BENICAR HCT) 40-25 MG tablet   metFORMIN (GLUCOPHAGE-XR) 500 MG 24 hr tablet   liraglutide (VICTOZA) 18 MG/3ML SOPN   Insulin Pen Needle (B-D UF III MINI PEN NEEDLES) 31G X 5 MM MISC   glucose blood (ONE TOUCH ULTRA TEST) test strip   fenofibrate 160 MG tablet   dapagliflozin propanediol (FARXIGA) 5 MG TABS tablet   atorvastatin (LIPITOR) 40 MG tablet   Other Relevant Orders   Hemoglobin A1c   Comprehensive metabolic panel   Essential hypertension - Primary   Relevant Medications     olmesartan-hydrochlorothiazide (BENICAR HCT) 40-25 MG tablet   fenofibrate 160 MG tablet   atorvastatin (LIPITOR) 40 MG tablet    Other Visit Diagnoses    Cough       Relevant Medications   HYDROcodone-homatropine (HYCODAN) 5-1.5 MG/5ML syrup   Hyperlipidemia, unspecified hyperlipidemia type       Relevant Medications   olmesartan-hydrochlorothiazide (BENICAR HCT) 40-25 MG tablet   fenofibrate 160 MG tablet   atorvastatin (LIPITOR) 40 MG tablet   Other Relevant Orders   Comprehensive metabolic panel   Lipid panel   Primary osteoarthritis of left knee       Relevant Medications   oxyCODONE-acetaminophen (ROXICET) 5-325 MG tablet   Other Relevant Orders   Pain Mgmt, Profile 8 w/Conf, U   High risk medication use       Relevant Orders   Pain Mgmt, Profile 8 w/Conf, U   Need for shingles vaccine       Relevant Orders   Varicella-zoster vaccine IM (Shingrix) (Completed)   Influenza vaccine administered       Relevant Orders   Flu vaccine HIGH DOSE PF (Fluzone High dose) (Completed)      Follow-up: Return in about 4 weeks (around 06/12/2018), or if symptoms worsen or fail to improve, for hypertension.  Ann Held, DO

## 2018-05-15 NOTE — Patient Instructions (Signed)

## 2018-05-17 LAB — PAIN MGMT, PROFILE 8 W/CONF, U
6 Acetylmorphine: NEGATIVE ng/mL (ref <10)
Alcohol Metabolites: POSITIVE ng/mL — AB (ref <500)
Amphetamines: NEGATIVE ng/mL (ref <500)
BENZODIAZEPINES: NEGATIVE ng/mL (ref <100)
Buprenorphine, Urine: NEGATIVE ng/mL (ref <5)
Cocaine Metabolite: NEGATIVE ng/mL (ref <150)
Creatinine: 71 mg/dL
Ethyl Glucuronide (ETG): 8813 ng/mL — ABNORMAL HIGH (ref <500)
Ethyl Sulfate (ETS): 3137 ng/mL — ABNORMAL HIGH (ref <100)
MDA: NEGATIVE ng/mL (ref <200)
MDMA: NEGATIVE ng/mL (ref <200)
MDMA: NEGATIVE ng/mL (ref <500)
Marijuana Metabolite: NEGATIVE ng/mL (ref <20)
OPIATES: NEGATIVE ng/mL (ref <100)
Oxidant: NEGATIVE ug/mL (ref <200)
Oxycodone: NEGATIVE ng/mL (ref <100)
pH: 6.77 (ref 4.5–9.0)

## 2018-05-29 ENCOUNTER — Ambulatory Visit: Payer: BC Managed Care – PPO | Admitting: Family Medicine

## 2018-07-13 LAB — HM DIABETES EYE EXAM

## 2018-07-17 ENCOUNTER — Telehealth: Payer: Self-pay | Admitting: *Deleted

## 2018-07-17 NOTE — Telephone Encounter (Signed)
Received Diabetic Eye Exam Report from [facility] Specialist; forwarded to provider/SLS 02/03

## 2018-08-02 ENCOUNTER — Encounter: Payer: Self-pay | Admitting: *Deleted

## 2018-08-15 ENCOUNTER — Encounter: Payer: Self-pay | Admitting: Family Medicine

## 2018-08-15 ENCOUNTER — Ambulatory Visit: Payer: BC Managed Care – PPO | Admitting: Family Medicine

## 2018-08-15 VITALS — BP 154/62 | HR 86 | Resp 14 | Ht 72.0 in | Wt 364.0 lb

## 2018-08-15 DIAGNOSIS — Z23 Encounter for immunization: Secondary | ICD-10-CM | POA: Diagnosis not present

## 2018-08-15 DIAGNOSIS — M1712 Unilateral primary osteoarthritis, left knee: Secondary | ICD-10-CM

## 2018-08-15 DIAGNOSIS — E1151 Type 2 diabetes mellitus with diabetic peripheral angiopathy without gangrene: Secondary | ICD-10-CM | POA: Diagnosis not present

## 2018-08-15 DIAGNOSIS — E785 Hyperlipidemia, unspecified: Secondary | ICD-10-CM | POA: Diagnosis not present

## 2018-08-15 DIAGNOSIS — G8929 Other chronic pain: Secondary | ICD-10-CM

## 2018-08-15 DIAGNOSIS — E1165 Type 2 diabetes mellitus with hyperglycemia: Secondary | ICD-10-CM

## 2018-08-15 DIAGNOSIS — I10 Essential (primary) hypertension: Secondary | ICD-10-CM

## 2018-08-15 DIAGNOSIS — M25559 Pain in unspecified hip: Secondary | ICD-10-CM

## 2018-08-15 DIAGNOSIS — IMO0002 Reserved for concepts with insufficient information to code with codable children: Secondary | ICD-10-CM

## 2018-08-15 MED ORDER — OLMESARTAN MEDOXOMIL-HCTZ 40-25 MG PO TABS: 1.0000 | ORAL_TABLET | Freq: Every day | ORAL | 1 refills | Status: DC

## 2018-08-15 MED ORDER — OXYCODONE-ACETAMINOPHEN 5-325 MG PO TABS: 1.0000 | ORAL_TABLET | Freq: Three times a day (TID) | ORAL | 0 refills | Status: DC | PRN

## 2018-08-15 MED ORDER — BLOOD GLUCOSE MONITOR KIT: PACK | 0 refills | Status: DC

## 2018-08-15 MED ORDER — AMLODIPINE BESYLATE 5 MG PO TABS: 5.0000 mg | ORAL_TABLET | Freq: Every day | ORAL | 2 refills | Status: DC

## 2018-08-15 NOTE — Patient Instructions (Signed)
Carbohydrate Counting for Diabetes Mellitus, Adult  Carbohydrate counting is a method of keeping track of how many carbohydrates you eat. Eating carbohydrates naturally increases the amount of sugar (glucose) in the blood. Counting how many carbohydrates you eat helps keep your blood glucose within normal limits, which helps you manage your diabetes (diabetes mellitus). It is important to know how many carbohydrates you can safely have in each meal. This is different for every person. A diet and nutrition specialist (registered dietitian) can help you make a meal plan and calculate how many carbohydrates you should have at each meal and snack. Carbohydrates are found in the following foods:  Grains, such as breads and cereals.  Dried beans and soy products.  Starchy vegetables, such as potatoes, peas, and corn.  Fruit and fruit juices.  Milk and yogurt.  Sweets and snack foods, such as cake, cookies, candy, chips, and soft drinks. How do I count carbohydrates? There are two ways to count carbohydrates in food. You can use either of the methods or a combination of both. Reading "Nutrition Facts" on packaged food The "Nutrition Facts" list is included on the labels of almost all packaged foods and beverages in the [place] It includes:  The serving size.  Information about nutrients in each serving, including the grams (g) of carbohydrate per serving. To use the "Nutrition Facts":  Decide how many servings you will have.  Multiply the number of servings by the number of carbohydrates per serving.  The resulting number is the total amount of carbohydrates that you will be having. Learning standard serving sizes of other foods When you eat carbohydrate foods that are not packaged or do not include "Nutrition Facts" on the label, you need to measure the servings in order to count the amount of carbohydrates:  Measure the foods that you will eat with a food scale or measuring cup, if needed.   Decide how many standard-size servings you will eat.  Multiply the number of servings by 15. Most carbohydrate-rich foods have about 15 g of carbohydrates per serving. ? For example, if you eat 8 oz (170 g) of strawberries, you will have eaten 2 servings and 30 g of carbohydrates (2 servings x 15 g = 30 g).  For foods that have more than one food mixed, such as soups and casseroles, you must count the carbohydrates in each food that is included. The following list contains standard serving sizes of common carbohydrate-rich foods. Each of these servings has about 15 g of carbohydrates:   hamburger bun or  English muffin.   oz (15 mL) syrup.   oz (14 g) jelly.  1 slice of bread.  1 six-inch tortilla.  3 oz (85 g) cooked rice or pasta.  4 oz (113 g) cooked dried beans.  4 oz (113 g) starchy vegetable, such as peas, corn, or potatoes.  4 oz (113 g) hot cereal.  4 oz (113 g) mashed potatoes or  of a large baked potato.  4 oz (113 g) canned or frozen fruit.  4 oz (120 mL) fruit juice.  4-6 crackers.  6 chicken nuggets.  6 oz (170 g) unsweetened dry cereal.  6 oz (170 g) plain fat-free yogurt or yogurt sweetened with artificial sweeteners.  8 oz (240 mL) milk.  8 oz (170 g) fresh fruit or one small piece of fruit.  24 oz (680 g) popped popcorn. Example of carbohydrate counting Sample meal  3 oz (85 g) chicken breast.  6 oz (170 g)   brown rice.  4 oz (113 g) corn.  8 oz (240 mL) milk.  8 oz (170 g) strawberries with sugar-free whipped topping. Carbohydrate calculation 1. Identify the foods that contain carbohydrates: ? Rice. ? Corn. ? Milk. ? Strawberries. 2. Calculate how many servings you have of each food: ? 2 servings rice. ? 1 serving corn. ? 1 serving milk. ? 1 serving strawberries. 3. Multiply each number of servings by 15 g: ? 2 servings rice x 15 g = 30 g. ? 1 serving corn x 15 g = 15 g. ? 1 serving milk x 15 g = 15 g. ? 1 serving  strawberries x 15 g = 15 g. 4. Add together all of the amounts to find the total grams of carbohydrates eaten: ? 30 g + 15 g + 15 g + 15 g = 75 g of carbohydrates total. Summary  Carbohydrate counting is a method of keeping track of how many carbohydrates you eat.  Eating carbohydrates naturally increases the amount of sugar (glucose) in the blood.  Counting how many carbohydrates you eat helps keep your blood glucose within normal limits, which helps you manage your diabetes.  A diet and nutrition specialist (registered dietitian) can help you make a meal plan and calculate how many carbohydrates you should have at each meal and snack. This information is not intended to replace advice given to you by your health care provider. Make sure you discuss any questions you have with your health care provider. Document Released: 05/31/2005 Document Revised: 12/08/2016 Document Reviewed: 11/12/2015 Elsevier Interactive Patient Education  2019 [employer] Inc.  

## 2018-08-15 NOTE — Progress Notes (Signed)
Patient ID: Richard [name], male    DOB: [birth year]  Age: 68 y.o. MRN: [id number]    Subjective:  Subjective  HPI Raciel Caffrey presents for f/u dm, chol and bp.   Pt c/o hip pain and needs a refill of pain meds pain seems to be worsening but he does not want xray or referral at this time   HYPERTENSION   Blood pressure range-high at home  Chest pain- no      Dyspnea- no Lightheadedness- no   Edema- no  Other side effects - no   Medication compliance: good Low salt diet- no    DIABETES    Blood Sugar ranges-130s  Polyuria- no New Visual problems- no  Hypoglycemic symptoms- no  Other side effects-no Medication compliance - good Last eye exam- due Foot exam- today   HYPERLIPIDEMIA  Medication compliance- good RUQ pain- no  Muscle aches- no Other side effects-no      Review of Systems  Constitutional: Negative for appetite change, diaphoresis, fatigue and unexpected weight change.  Eyes: Negative for pain, redness and visual disturbance.  Respiratory: Negative for cough, chest tightness, shortness of breath and wheezing.   Cardiovascular: Negative for chest pain, palpitations and leg swelling.  Endocrine: Negative for cold intolerance, heat intolerance, polydipsia, polyphagia and polyuria.  Genitourinary: Negative for difficulty urinating, dysuria and frequency.  Neurological: Negative for dizziness, light-headedness, numbness and headaches.    History Past Medical History:  Diagnosis Date  . Allergy    seasonal  . Arthritis     left knee,  right hip  . Complication of anesthesia    half awake during last colonscopy  . Diabetes mellitus    type 2  . Hx of adenomatous colonic polyps    2010 6 mm adenoma 10/06/2016 2 5 mm descending polyps   . Hyperlipidemia   . Hypertension     He has a past surgical history that includes Colonoscopy and Colonoscopy with propofol (N/A, 10/06/2016).   His family history includes Diabetes in his brother, mother, and sister;  Hypertension in his sister; Stroke in his sister.He reports that he has never smoked. He has never used smokeless tobacco. He reports current alcohol use. He reports that he does not use drugs.  Current Outpatient Medications on File Prior to Visit  Medication Sig Dispense Refill  . atorvastatin (LIPITOR) 40 MG tablet Take 1 tablet (40 mg total) by mouth at bedtime. 90 tablet 1  . dapagliflozin propanediol (FARXIGA) 5 MG TABS tablet Take 5 mg by mouth daily. 90 tablet 1  . fenofibrate 160 MG tablet Take 1 tablet (160 mg total) by mouth daily. 90 tablet 1  . glucose blood (ONE TOUCH ULTRA TEST) test strip TEST BLOOD SUGAR ONCE DAILY.  Dx code: E11.9 100 each 1  . HYDROcodone-homatropine (HYCODAN) 5-1.5 MG/5ML syrup Take 5 mLs by mouth every 8 (eight) hours as needed for cough. 120 mL 0  . Insulin Pen Needle (B-D UF III MINI PEN NEEDLES) 31G X 5 MM MISC USE AS DIRECTED 100 each 1  . liraglutide (VICTOZA) 18 MG/3ML SOPN INJECT 1.8 MG UNDER THE SKIN ONCE DAILY 9 mL 5  . metFORMIN (GLUCOPHAGE-XR) 500 MG 24 hr tablet TAKE 2 TABLETS BY MOUTH TWICE A DAY WITH FOOD 360 tablet 1   No current facility-administered medications on file prior to visit.      Objective:  Objective  Physical Exam Nursing note reviewed.  Constitutional:      General: He is sleeping.  Appearance: He is well-developed.  HENT:     Head: Normocephalic and atraumatic.  Eyes:     Pupils: Pupils are equal, round, and reactive to light.  Neck:     Musculoskeletal: Normal range of motion and neck supple.     Thyroid: No thyromegaly.  Cardiovascular:     Rate and Rhythm: Normal rate and regular rhythm.     Heart sounds: No murmur.  Pulmonary:     Effort: Pulmonary effort is normal. No respiratory distress.     Breath sounds: Normal breath sounds. No wheezing or rales.  Chest:     Chest wall: No tenderness.  Musculoskeletal:        General: No tenderness.  Skin:    General: Skin is warm and dry.  Neurological:      Mental Status: He is oriented to person, place, and time.  Psychiatric:        Behavior: Behavior normal.        Thought Content: Thought content normal.        Judgment: Judgment normal.    Diabetic Foot Exam - Simple   No data filed      BP (!) 154/62   Pulse 86   Resp 14   Ht 6' (1.829 m)   Wt (!) 364 lb (165.1 kg)   SpO2 96%   BMI 49.37 kg/m  Wt Readings from Last 3 Encounters:  08/15/18 (!) 364 lb (165.1 kg)  05/15/18 (!) 361 lb 12.8 oz (164.1 kg)  12/05/17 (!) 346 lb 9.6 oz (157.2 kg)     Lab Results  Component Value Date   WBC 7.0 02/06/2013   HGB 15.4 02/06/2013   HCT 45.3 02/06/2013   PLT 234.0 02/06/2013   GLUCOSE 103 (H) 05/15/2018   CHOL 220 (H) 05/15/2018   TRIG 113.0 05/15/2018   HDL 49.80 05/15/2018   LDLDIRECT 133.0 12/05/2017   LDLCALC 148 (H) 05/15/2018   ALT 16 05/15/2018   AST 15 05/15/2018   NA 137 05/15/2018   K 4.2 05/15/2018   CL 100 05/15/2018   CREATININE 0.94 05/15/2018   BUN 14 05/15/2018   CO2 27 05/15/2018   TSH 0.75 01/27/2009   PSA 0.29 01/27/2009   HGBA1C 7.0 (H) 05/15/2018   MICROALBUR 1.0 04/29/2014    No results found.   Assessment & Plan:  Plan  I am having [name] "Louie Casa" start on blood glucose meter kit and supplies and amLODipine. I am also having him maintain his HYDROcodone-homatropine, metFORMIN, liraglutide, Insulin Pen Needle, glucose blood, fenofibrate, dapagliflozin propanediol, atorvastatin, olmesartan-hydrochlorothiazide, and oxyCODONE-acetaminophen.  Meds ordered this encounter  Medications  . olmesartan-hydrochlorothiazide (BENICAR HCT) 40-25 MG tablet    Sig: Take 1 tablet by mouth daily.    Dispense:  90 tablet    Refill:  1    Cancel benicar hct  20/12.5  . DISCONTD: oxyCODONE-acetaminophen (ROXICET) 5-325 MG tablet    Sig: Take 1 tablet by mouth every 8 (eight) hours as needed for severe pain.    Dispense:  21 tablet    Refill:  0  . oxyCODONE-acetaminophen (ROXICET) 5-325 MG tablet     Sig: Take 1 tablet by mouth every 8 (eight) hours as needed for severe pain.    Dispense:  21 tablet    Refill:  0  . blood glucose meter kit and supplies KIT    Sig: Dispense based on patient and insurance preference. Use up to four times daily as directed. (FOR ICD-9 250.00, 250.01).  Dispense:  1 each    Refill:  0    Order Specific Question:   Number of strips    Answer:   100    Order Specific Question:   Number of lancets    Answer:   100  . amLODipine (NORVASC) 5 MG tablet    Sig: Take 1 tablet (5 mg total) by mouth daily.    Dispense:  30 tablet    Refill:  2    Problem List Items Addressed This Visit      Unprioritized   Chronic hip pain    Refill pain meds Pt will call when he is ready for xray/ ortho      Relevant Medications   oxyCODONE-acetaminophen (ROXICET) 5-325 MG tablet   DM (diabetes mellitus) type II uncontrolled, periph vascular disorder ([place]) - Primary    , minimize simple carbs. Increase exercise as tolerated. Continue current meds Lab Results  Component Value Date   HGBA1C 7.0 (H) 05/15/2018         Relevant Medications   olmesartan-hydrochlorothiazide (BENICAR HCT) 40-25 MG tablet   blood glucose meter kit and supplies KIT   amLODipine (NORVASC) 5 MG tablet   Essential hypertension    Poorly controlled will alter medications, encouraged DASH diet, minimize caffeine and obtain adequate sleep. Report concerning symptoms and follow up as directed and as needed      Relevant Medications   olmesartan-hydrochlorothiazide (BENICAR HCT) 40-25 MG tablet   amLODipine (NORVASC) 5 MG tablet   Hyperlipidemia LDL goal <70    Tolerating statin, encouraged heart healthy diet, avoid trans fats, minimize simple carbs and saturated fats. Increase exercise as tolerated      Relevant Medications   olmesartan-hydrochlorothiazide (BENICAR HCT) 40-25 MG tablet   amLODipine (NORVASC) 5 MG tablet    Other Visit Diagnoses    Primary osteoarthritis of left knee        Relevant Medications   oxyCODONE-acetaminophen (ROXICET) 5-325 MG tablet      Follow-up: Return in about 6 months (around 02/15/2019), or if symptoms worsen or fail to improve, for hypertension, hyperlipidemia, diabetes II, annual exam, fasting.  Ann Held, DO

## 2018-08-16 DIAGNOSIS — M25551 Pain in right hip: Secondary | ICD-10-CM | POA: Insufficient documentation

## 2018-08-16 DIAGNOSIS — M25559 Pain in unspecified hip: Secondary | ICD-10-CM

## 2018-08-16 DIAGNOSIS — G8929 Other chronic pain: Secondary | ICD-10-CM | POA: Insufficient documentation

## 2018-08-16 NOTE — Assessment & Plan Note (Signed)
,   minimize simple carbs. Increase exercise as tolerated. Continue current meds Lab Results  Component Value Date   HGBA1C 7.0 (H) 05/15/2018

## 2018-08-16 NOTE — Assessment & Plan Note (Signed)
Tolerating statin, encouraged heart healthy diet, avoid trans fats, minimize simple carbs and saturated fats. Increase exercise as tolerated 

## 2018-08-16 NOTE — Assessment & Plan Note (Signed)
Poorly controlled will alter medications, encouraged DASH diet, minimize caffeine and obtain adequate sleep. Report concerning symptoms and follow up as directed and as needed 

## 2018-08-16 NOTE — Assessment & Plan Note (Signed)
Refill pain meds Pt will call when he is ready for xray/ ortho

## 2018-11-05 ENCOUNTER — Other Ambulatory Visit: Payer: Self-pay | Admitting: Family Medicine

## 2018-11-05 DIAGNOSIS — I10 Essential (primary) hypertension: Secondary | ICD-10-CM

## 2018-11-15 ENCOUNTER — Other Ambulatory Visit: Payer: Self-pay | Admitting: Family Medicine

## 2018-11-15 DIAGNOSIS — I10 Essential (primary) hypertension: Secondary | ICD-10-CM

## 2019-01-29 ENCOUNTER — Telehealth: Payer: Self-pay | Admitting: *Deleted

## 2019-01-29 NOTE — Telephone Encounter (Signed)
Spoke to pt and he states he only wants to schedule his regular follow up and not a physical at this time. Advised pt he already has a 6 month f/u on 02/15/19 at 9:40. Pt states he will keep current appt as scheduled.  Copied from [place] [phone]. Topic: General - Other >> Jan 26, 2019  7:57 AM Keene Breath wrote: Reason for CRM: Patient called to schedule a physical appt.  Please call patient back at [phone]

## 2019-02-15 ENCOUNTER — Telehealth: Payer: Self-pay | Admitting: Family Medicine

## 2019-02-15 ENCOUNTER — Encounter: Payer: Self-pay | Admitting: Family Medicine

## 2019-02-15 ENCOUNTER — Ambulatory Visit: Payer: BC Managed Care – PPO | Admitting: Family Medicine

## 2019-02-15 ENCOUNTER — Other Ambulatory Visit: Payer: Self-pay

## 2019-02-15 VITALS — BP 130/70 | HR 86 | Temp 97.1°F | Resp 18 | Ht 72.0 in | Wt 351.6 lb

## 2019-02-15 DIAGNOSIS — Z23 Encounter for immunization: Secondary | ICD-10-CM | POA: Diagnosis not present

## 2019-02-15 DIAGNOSIS — E1151 Type 2 diabetes mellitus with diabetic peripheral angiopathy without gangrene: Secondary | ICD-10-CM | POA: Diagnosis not present

## 2019-02-15 DIAGNOSIS — E1165 Type 2 diabetes mellitus with hyperglycemia: Secondary | ICD-10-CM | POA: Diagnosis not present

## 2019-02-15 DIAGNOSIS — M1712 Unilateral primary osteoarthritis, left knee: Secondary | ICD-10-CM

## 2019-02-15 DIAGNOSIS — E785 Hyperlipidemia, unspecified: Secondary | ICD-10-CM | POA: Diagnosis not present

## 2019-02-15 DIAGNOSIS — I10 Essential (primary) hypertension: Secondary | ICD-10-CM | POA: Diagnosis not present

## 2019-02-15 DIAGNOSIS — IMO0002 Reserved for concepts with insufficient information to code with codable children: Secondary | ICD-10-CM

## 2019-02-15 LAB — COMPREHENSIVE METABOLIC PANEL
ALT: 14 U/L (ref 0–53)
AST: 15 U/L (ref 0–37)
Albumin: 4.3 g/dL (ref 3.5–5.2)
Alkaline Phosphatase: 41 U/L (ref 39–117)
BUN: 19 mg/dL (ref 6–23)
CO2: 27 mEq/L (ref 19–32)
Calcium: 9.4 mg/dL (ref 8.4–10.5)
Chloride: 101 mEq/L (ref 96–112)
Creatinine, Ser: 1.12 mg/dL (ref 0.40–1.50)
GFR: 79.35 mL/min (ref >60.00)
Glucose, Bld: 107 mg/dL — ABNORMAL HIGH (ref 70–99)
Potassium: 4 mEq/L (ref 3.5–5.1)
Sodium: 138 mEq/L (ref 135–145)
Total Bilirubin: 0.5 mg/dL (ref 0.2–1.2)
Total Protein: 7.3 g/dL (ref 6.0–8.3)

## 2019-02-15 LAB — LIPID PANEL
Cholesterol: 128 mg/dL (ref 0–200)
HDL: 46.2 mg/dL (ref >39.00)
LDL Cholesterol: 65 mg/dL (ref 0–99)
NonHDL: 81.95
Total CHOL/HDL Ratio: 3
Triglycerides: 86 mg/dL (ref 0.0–149.0)
VLDL: 17.2 mg/dL (ref 0.0–40.0)

## 2019-02-15 LAB — HEMOGLOBIN A1C: Hgb A1c MFr Bld: 6.7 % — ABNORMAL HIGH (ref 4.6–6.5)

## 2019-02-15 MED ORDER — AMLODIPINE BESYLATE 5 MG PO TABS: 5.0000 mg | ORAL_TABLET | Freq: Every day | ORAL | 0 refills | Status: DC

## 2019-02-15 MED ORDER — GLUCOSE BLOOD VI STRP: ORAL_STRIP | 1 refills | Status: DC

## 2019-02-15 MED ORDER — ATORVASTATIN CALCIUM 40 MG PO TABS: 40.0000 mg | ORAL_TABLET | Freq: Every day | ORAL | 1 refills | Status: DC

## 2019-02-15 MED ORDER — FENOFIBRATE 160 MG PO TABS: 160.0000 mg | ORAL_TABLET | Freq: Every day | ORAL | 1 refills | Status: DC

## 2019-02-15 MED ORDER — BD PEN NEEDLE MINI U/F 31G X 5 MM MISC: 1 refills | Status: DC

## 2019-02-15 MED ORDER — OLMESARTAN MEDOXOMIL-HCTZ 40-25 MG PO TABS: 1.0000 | ORAL_TABLET | Freq: Every day | ORAL | 1 refills | Status: DC

## 2019-02-15 MED ORDER — OXYCODONE-ACETAMINOPHEN 5-325 MG PO TABS: 1.0000 | ORAL_TABLET | Freq: Three times a day (TID) | ORAL | 0 refills | Status: DC | PRN

## 2019-02-15 MED ORDER — FARXIGA 5 MG PO TABS: 5.0000 mg | ORAL_TABLET | Freq: Every day | ORAL | 1 refills | Status: DC

## 2019-02-15 NOTE — Assessment & Plan Note (Signed)
Well controlled, no changes to meds. Encouraged heart healthy diet such as the DASH diet and exercise as tolerated.  °

## 2019-02-15 NOTE — Patient Instructions (Signed)
Carbohydrate Counting for Diabetes Mellitus, Adult  Carbohydrate counting is a method of keeping track of how many carbohydrates you eat. Eating carbohydrates naturally increases the amount of sugar (glucose) in the blood. Counting how many carbohydrates you eat helps keep your blood glucose within normal limits, which helps you manage your diabetes (diabetes mellitus). It is important to know how many carbohydrates you can safely have in each meal. This is different for every person. A diet and nutrition specialist (registered dietitian) can help you make a meal plan and calculate how many carbohydrates you should have at each meal and snack. Carbohydrates are found in the following foods:  Grains, such as breads and cereals.  Dried beans and soy products.  Starchy vegetables, such as potatoes, peas, and corn.  Fruit and fruit juices.  Milk and yogurt.  Sweets and snack foods, such as cake, cookies, candy, chips, and soft drinks. How do I count carbohydrates? There are two ways to count carbohydrates in food. You can use either of the methods or a combination of both. Reading "Nutrition Facts" on packaged food The "Nutrition Facts" list is included on the labels of almost all packaged foods and beverages in the [place] It includes:  The serving size.  Information about nutrients in each serving, including the grams (g) of carbohydrate per serving. To use the "Nutrition Facts":  Decide how many servings you will have.  Multiply the number of servings by the number of carbohydrates per serving.  The resulting number is the total amount of carbohydrates that you will be having. Learning standard serving sizes of other foods When you eat carbohydrate foods that are not packaged or do not include "Nutrition Facts" on the label, you need to measure the servings in order to count the amount of carbohydrates:  Measure the foods that you will eat with a food scale or measuring cup, if needed.   Decide how many standard-size servings you will eat.  Multiply the number of servings by 15. Most carbohydrate-rich foods have about 15 g of carbohydrates per serving. ? For example, if you eat 8 oz (170 g) of strawberries, you will have eaten 2 servings and 30 g of carbohydrates (2 servings x 15 g = 30 g).  For foods that have more than one food mixed, such as soups and casseroles, you must count the carbohydrates in each food that is included. The following list contains standard serving sizes of common carbohydrate-rich foods. Each of these servings has about 15 g of carbohydrates:   hamburger bun or  English muffin.   oz (15 mL) syrup.   oz (14 g) jelly.  1 slice of bread.  1 six-inch tortilla.  3 oz (85 g) cooked rice or pasta.  4 oz (113 g) cooked dried beans.  4 oz (113 g) starchy vegetable, such as peas, corn, or potatoes.  4 oz (113 g) hot cereal.  4 oz (113 g) mashed potatoes or  of a large baked potato.  4 oz (113 g) canned or frozen fruit.  4 oz (120 mL) fruit juice.  4-6 crackers.  6 chicken nuggets.  6 oz (170 g) unsweetened dry cereal.  6 oz (170 g) plain fat-free yogurt or yogurt sweetened with artificial sweeteners.  8 oz (240 mL) milk.  8 oz (170 g) fresh fruit or one small piece of fruit.  24 oz (680 g) popped popcorn. Example of carbohydrate counting Sample meal  3 oz (85 g) chicken breast.  6 oz (170 g)   brown rice.  4 oz (113 g) corn.  8 oz (240 mL) milk.  8 oz (170 g) strawberries with sugar-free whipped topping. Carbohydrate calculation 1. Identify the foods that contain carbohydrates: ? Rice. ? Corn. ? Milk. ? Strawberries. 2. Calculate how many servings you have of each food: ? 2 servings rice. ? 1 serving corn. ? 1 serving milk. ? 1 serving strawberries. 3. Multiply each number of servings by 15 g: ? 2 servings rice x 15 g = 30 g. ? 1 serving corn x 15 g = 15 g. ? 1 serving milk x 15 g = 15 g. ? 1 serving  strawberries x 15 g = 15 g. 4. Add together all of the amounts to find the total grams of carbohydrates eaten: ? 30 g + 15 g + 15 g + 15 g = 75 g of carbohydrates total. Summary  Carbohydrate counting is a method of keeping track of how many carbohydrates you eat.  Eating carbohydrates naturally increases the amount of sugar (glucose) in the blood.  Counting how many carbohydrates you eat helps keep your blood glucose within normal limits, which helps you manage your diabetes.  A diet and nutrition specialist (registered dietitian) can help you make a meal plan and calculate how many carbohydrates you should have at each meal and snack. This information is not intended to replace advice given to you by your health care provider. Make sure you discuss any questions you have with your health care provider. Document Released: 05/31/2005 Document Revised: 12/23/2016 Document Reviewed: 11/12/2015 Elsevier Patient Education  2020 [employer]vier [employer].  

## 2019-02-15 NOTE — Assessment & Plan Note (Signed)
hgba1c to be done, minimize simple carbs. Increase exercise as tolerated. Continue current meds  

## 2019-02-15 NOTE — Assessment & Plan Note (Signed)
Tolerating statin, encouraged heart healthy diet, avoid trans fats, minimize simple carbs and saturated fats. Increase exercise as tolerated 

## 2019-02-15 NOTE — Progress Notes (Signed)
Patient ID: Richard [name], male    DOB: [birth year]  Age: 68 y.o. MRN: [id number]    Subjective:  Subjective  HPI Richard [name] presents for f/u bp, chol and dm.     HYPERTENSION   Blood pressure range-good per pt   Chest pain- no      Dyspnea- no Lightheadedness- no   Edema- no  Other side effects - no   Medication compliance: good Low salt diet- yes    DIABETES    Blood Sugar ranges-103-130   He had 1 reading at 2015  Polyuria- no New Visual problems- no  Hypoglycemic symptoms- no  Other side effects-no Medication compliance - good Last eye exam- 06/2018 Foot exam- today   HYPERLIPIDEMIA  Medication compliance- good RUQ pain- no  Muscle aches- no Other side effects-no       Review of Systems  Constitutional: Negative for appetite change, diaphoresis, fatigue and unexpected weight change.  Eyes: Negative for pain, redness and visual disturbance.  Respiratory: Negative for cough, chest tightness, shortness of breath and wheezing.   Cardiovascular: Negative for chest pain, palpitations and leg swelling.  Endocrine: Negative for cold intolerance, heat intolerance, polydipsia, polyphagia and polyuria.  Genitourinary: Negative for difficulty urinating, dysuria and frequency.  Neurological: Negative for dizziness, light-headedness, numbness and headaches.    History Past Medical History:  Diagnosis Date  . Allergy    seasonal  . Arthritis     left knee,  right hip  . Complication of anesthesia    half awake during last colonscopy  . Diabetes mellitus    type 2  . Hx of adenomatous colonic polyps    2010 6 mm adenoma 10/06/2016 2 5 mm descending polyps   . Hyperlipidemia   . Hypertension     He has a past surgical history that includes Colonoscopy and Colonoscopy with propofol (N/A, 10/06/2016).   His family history includes Diabetes in his brother, mother, and sister; Hypertension in his sister; Stroke in his sister.He reports that he has never smoked. He  has never used smokeless tobacco. He reports current alcohol use. He reports that he does not use drugs.  Current Outpatient Medications on File Prior to Visit  Medication Sig Dispense Refill  . blood glucose meter kit and supplies KIT Dispense based on patient and insurance preference. Use up to four times daily as directed. (FOR ICD-9 250.00, 250.01). 1 each 0  . liraglutide (VICTOZA) 18 MG/3ML SOPN INJECT 1.8 MG UNDER THE SKIN ONCE DAILY 9 mL 5  . metFORMIN (GLUCOPHAGE-XR) 500 MG 24 hr tablet TAKE 2 TABLETS BY MOUTH TWICE A DAY WITH FOOD 360 tablet 1   No current facility-administered medications on file prior to visit.      Objective:  Objective  Physical Exam Vitals signs and nursing note reviewed.  Constitutional:      General: He is sleeping.     Appearance: He is well-developed.  HENT:     Head: Normocephalic and atraumatic.  Eyes:     Pupils: Pupils are equal, round, and reactive to light.  Neck:     Musculoskeletal: Normal range of motion and neck supple.     Thyroid: No thyromegaly.  Cardiovascular:     Rate and Rhythm: Normal rate and regular rhythm.     Heart sounds: No murmur.  Pulmonary:     Effort: Pulmonary effort is normal. No respiratory distress.     Breath sounds: Normal breath sounds. No wheezing or rales.  Chest:  Chest wall: No tenderness.  Musculoskeletal:        General: No tenderness.  Skin:    General: Skin is warm and dry.  Neurological:     Mental Status: He is oriented to person, place, and time.  Psychiatric:        Behavior: Behavior normal.        Thought Content: Thought content normal.        Judgment: Judgment normal.    Diabetic Foot Exam - Simple   Simple Foot Form Diabetic Foot exam was performed with the following findings: Yes 02/15/2019 12:56 PM  Visual Inspection No deformities, no ulcerations, no other skin breakdown bilaterally: Yes Sensation Testing Intact to touch and monofilament testing bilaterally: Yes Pulse Check  Posterior Tibialis and Dorsalis pulse intact bilaterally: Yes Comments     BP 130/70 (BP Location: Right Arm, Patient Position: Sitting, Cuff Size: Large)   Pulse 86   Temp (!) 97.1 F (36.2 C) (Temporal)   Resp 18   Ht 6' (1.829 m)   Wt (!) 351 lb 9.6 oz (159.5 kg)   SpO2 95%   BMI 47.69 kg/m  Wt Readings from Last 3 Encounters:  02/15/19 (!) 351 lb 9.6 oz (159.5 kg)  08/15/18 (!) 364 lb (165.1 kg)  05/15/18 (!) 361 lb 12.8 oz (164.1 kg)     Lab Results  Component Value Date   WBC 7.0 02/06/2013   HGB 15.4 02/06/2013   HCT 45.3 02/06/2013   PLT 234.0 02/06/2013   GLUCOSE 107 (H) 02/15/2019   CHOL 128 02/15/2019   TRIG 86.0 02/15/2019   HDL 46.20 02/15/2019   LDLDIRECT 133.0 12/05/2017   LDLCALC 65 02/15/2019   ALT 14 02/15/2019   AST 15 02/15/2019   NA 138 02/15/2019   K 4.0 02/15/2019   CL 101 02/15/2019   CREATININE 1.12 02/15/2019   BUN 19 02/15/2019   CO2 27 02/15/2019   TSH 0.75 01/27/2009   PSA 0.29 01/27/2009   HGBA1C 6.7 (H) 02/15/2019   MICROALBUR 1.0 04/29/2014    No results found.   Assessment & Plan:  Plan  I have discontinued Richard [name] "Richard [name]"'s HYDROcodone-homatropine. I have also changed his amLODipine. Additionally, I am having him maintain his metFORMIN, liraglutide, blood glucose meter kit and supplies, olmesartan-hydrochlorothiazide, atorvastatin, Farxiga, fenofibrate, oxyCODONE-acetaminophen, B-D UF III MINI PEN NEEDLES, and glucose blood.  Meds ordered this encounter  Medications  . amLODipine (NORVASC) 5 MG tablet    Sig: Take 1 tablet (5 mg total) by mouth daily.    Dispense:  90 tablet    Refill:  0    DX Code Needed  .  Marland Kitchen olmesartan-hydrochlorothiazide (BENICAR HCT) 40-25 MG tablet    Sig: Take 1 tablet by mouth daily.    Dispense:  90 tablet    Refill:  1    Cancel benicar hct  20/12.5  . atorvastatin (LIPITOR) 40 MG tablet    Sig: Take 1 tablet (40 mg total) by mouth at bedtime.    Dispense:  90 tablet    Refill:   1  . dapagliflozin propanediol (FARXIGA) 5 MG TABS tablet    Sig: Take 5 mg by mouth daily.    Dispense:  90 tablet    Refill:  1  . fenofibrate 160 MG tablet    Sig: Take 1 tablet (160 mg total) by mouth daily.    Dispense:  90 tablet    Refill:  1  . oxyCODONE-acetaminophen (ROXICET) 5-325 MG tablet  Sig: Take 1 tablet by mouth every 8 (eight) hours as needed for severe pain.    Dispense:  21 tablet    Refill:  0  . Insulin Pen Needle (B-D UF III MINI PEN NEEDLES) 31G X 5 MM MISC    Sig: USE AS DIRECTED    Dispense:  100 each    Refill:  1  . glucose blood (ONE TOUCH ULTRA TEST) test strip    Sig: TEST BLOOD SUGAR ONCE DAILY.  Dx code: E11.9    Dispense:  100 each    Refill:  1    Problem List Items Addressed This Visit      Unprioritized   DM (diabetes mellitus) type II uncontrolled, periph vascular disorder ([place])    hgba1c to be done, minimize simple carbs. Increase exercise as tolerated. Continue current meds       Relevant Medications   amLODipine (NORVASC) 5 MG tablet   olmesartan-hydrochlorothiazide (BENICAR HCT) 40-25 MG tablet   atorvastatin (LIPITOR) 40 MG tablet   dapagliflozin propanediol (FARXIGA) 5 MG TABS tablet   fenofibrate 160 MG tablet   Insulin Pen Needle (B-D UF III MINI PEN NEEDLES) 31G X 5 MM MISC   glucose blood (ONE TOUCH ULTRA TEST) test strip   Other Relevant Orders   Hemoglobin A1c (Completed)   Comprehensive metabolic panel (Completed)   Essential hypertension    Well controlled, no changes to meds. Encouraged heart healthy diet such as the DASH diet and exercise as tolerated.       Relevant Medications   amLODipine (NORVASC) 5 MG tablet   olmesartan-hydrochlorothiazide (BENICAR HCT) 40-25 MG tablet   atorvastatin (LIPITOR) 40 MG tablet   fenofibrate 160 MG tablet   Other Relevant Orders   Hemoglobin A1c (Completed)   Lipid panel (Completed)   Comprehensive metabolic panel (Completed)   Hyperlipidemia LDL goal <70    Tolerating  statin, encouraged heart healthy diet, avoid trans fats, minimize simple carbs and saturated fats. Increase exercise as tolerated      Relevant Medications   amLODipine (NORVASC) 5 MG tablet   olmesartan-hydrochlorothiazide (BENICAR HCT) 40-25 MG tablet   atorvastatin (LIPITOR) 40 MG tablet   fenofibrate 160 MG tablet    Other Visit Diagnoses    Need for influenza vaccination    -  Primary   Relevant Orders   Flu Vaccine QUAD High Dose(Fluad)   Hyperlipidemia, unspecified hyperlipidemia type       Relevant Medications   amLODipine (NORVASC) 5 MG tablet   olmesartan-hydrochlorothiazide (BENICAR HCT) 40-25 MG tablet   atorvastatin (LIPITOR) 40 MG tablet   fenofibrate 160 MG tablet   Other Relevant Orders   Lipid panel (Completed)   Comprehensive metabolic panel (Completed)   Primary osteoarthritis of left knee       Relevant Medications   oxyCODONE-acetaminophen (ROXICET) 5-325 MG tablet      Follow-up: Return in about 6 months (around 08/15/2019), or if symptoms worsen or fail to improve, for hypertension, hyperlipidemia, diabetes II.  Ann Held, DO

## 2019-02-15 NOTE — Telephone Encounter (Signed)
Patient received a letter from pharmacy about metformin being recalled. Wanted to speak with someone about his particular dosage/medication to make sure he doesn't need to stop taking the medication. Please advise.

## 2019-02-20 ENCOUNTER — Other Ambulatory Visit: Payer: Self-pay

## 2019-02-20 MED ORDER — METFORMIN HCL 500 MG PO TABS: 1000.0000 mg | ORAL_TABLET | Freq: Two times a day (BID) | ORAL | 2 refills | Status: DC

## 2019-02-20 NOTE — Telephone Encounter (Signed)
Refill sent.

## 2019-02-20 NOTE — Telephone Encounter (Signed)
We can switch to metformin 500 mg bid #60   Not extended release

## 2019-02-20 NOTE — Telephone Encounter (Signed)
Med correction. Two tablets twice a day

## 2019-02-20 NOTE — Telephone Encounter (Signed)
Is Metformin still on recall? Please advise.

## 2019-02-21 ENCOUNTER — Other Ambulatory Visit: Payer: Self-pay | Admitting: Family Medicine

## 2019-02-21 DIAGNOSIS — I10 Essential (primary) hypertension: Secondary | ICD-10-CM

## 2019-03-01 NOTE — Telephone Encounter (Signed)
avapro 300/12.5  #30  1 po qd  Recheck bp 2 weeks -- nurse visit ok

## 2019-03-01 NOTE — Telephone Encounter (Signed)
Please advise 

## 2019-03-01 NOTE — Telephone Encounter (Signed)
Adonis Huguenin from [facility] calling.  States that manufacturer is not providing medication at this time.  Pharmacy needs to know if provider would like to change this to something else. Adonis Huguenin can be reached at [phone]

## 2019-03-02 MED ORDER — IRBESARTAN 300 MG PO TABS: 300.0000 mg | ORAL_TABLET | Freq: Every day | ORAL | 2 refills | Status: DC

## 2019-03-02 NOTE — Addendum Note (Signed)
Addended by: Sanda Linger on: 03/02/2019 09:15 AM   Modules accepted: Orders

## 2019-03-07 NOTE — Telephone Encounter (Signed)
Spoke with patient regarding medication substitute. Patient states he has already spoken to someone in office about meds. Nurse visit scheduled for BP check per PCP order.

## 2019-03-21 ENCOUNTER — Other Ambulatory Visit: Payer: Self-pay

## 2019-03-21 ENCOUNTER — Ambulatory Visit: Payer: BC Managed Care – PPO | Admitting: Internal Medicine

## 2019-03-21 DIAGNOSIS — I10 Essential (primary) hypertension: Secondary | ICD-10-CM

## 2019-03-21 MED ORDER — IRBESARTAN-HYDROCHLOROTHIAZIDE 300-12.5 MG PO TABS: 1.0000 | ORAL_TABLET | Freq: Every day | ORAL | 2 refills | Status: DC

## 2019-03-21 NOTE — Progress Notes (Signed)
Patient here for blood pressure check per Dr. Etter Sjogren, phone note 02/21/19.  He was changed from olmesartin to irbesartan.  He has been taking medication for 2 weeks now.  He also stated that he had been out of medication for about 10 days before he started medication due to [place] not getting back with him about changing to something else.  Pharmacy called [place] on 02/21/19 stating that medication was not available and medication sent in on 03/02/19.  He has been checking at home and his numbers has been high since changing medication. Blood pressures have been averaging 150/90.  Blood pressure today:  Right 146/85 pulse 82 Left: 145/76 pulse 90  Plan: Dr. Larose Kells noticed that patient was suppose to be on  Irbesartan/hctz 300/12mg  and patient is only on irbesartan 300mg .  Per Dr. Larose Kells send in correct medication irbesartan/hctz 300/12.5mg  and recheck blood pressure in [redacted] weeks along with bmp and apologize to patient.  Advised patient of error, patient was not too happy about this.  Patient ok with plan, new medication sent in and appointment scheduled in 2 weeks for nurse visit and lab visit.

## 2019-03-23 ENCOUNTER — Other Ambulatory Visit: Payer: Self-pay | Admitting: *Deleted

## 2019-03-23 DIAGNOSIS — I10 Essential (primary) hypertension: Secondary | ICD-10-CM

## 2019-04-04 NOTE — Addendum Note (Signed)
Addended by: Caffie Pinto on: 04/04/2019 03:40 PM   Modules accepted: Orders

## 2019-04-05 ENCOUNTER — Other Ambulatory Visit (INDEPENDENT_AMBULATORY_CARE_PROVIDER_SITE_OTHER): Payer: BC Managed Care – PPO

## 2019-04-05 ENCOUNTER — Other Ambulatory Visit: Payer: Self-pay

## 2019-04-05 ENCOUNTER — Encounter: Payer: Self-pay | Admitting: Family Medicine

## 2019-04-05 ENCOUNTER — Ambulatory Visit (INDEPENDENT_AMBULATORY_CARE_PROVIDER_SITE_OTHER): Payer: BC Managed Care – PPO | Admitting: Family Medicine

## 2019-04-05 VITALS — BP 132/80 | HR 88

## 2019-04-05 DIAGNOSIS — I10 Essential (primary) hypertension: Secondary | ICD-10-CM

## 2019-04-05 DIAGNOSIS — M1712 Unilateral primary osteoarthritis, left knee: Secondary | ICD-10-CM

## 2019-04-05 LAB — BASIC METABOLIC PANEL
BUN: 15 mg/dL (ref 6–23)
CO2: 27 mEq/L (ref 19–32)
Calcium: 9.4 mg/dL (ref 8.4–10.5)
Chloride: 101 mEq/L (ref 96–112)
Creatinine, Ser: 0.96 mg/dL (ref 0.40–1.50)
GFR: 94.76 mL/min (ref >60.00)
Glucose, Bld: 99 mg/dL (ref 70–99)
Potassium: 4.2 mEq/L (ref 3.5–5.1)
Sodium: 138 mEq/L (ref 135–145)

## 2019-04-05 NOTE — Progress Notes (Signed)
Pt here for Blood pressure check per Dr. Larose Kells for Dr. Carollee Herter  Pt currently takes: Avalide/Hctz 300/12.5mg   Patient has been taking blood pressures at home and they have been running up and down. The lowest its been was 123/78.   Pt reports compliance with medication.  BP today @ = 132/80 HR = 88  Pt advised per blood pressure looks good today and we will recheck at your visit in March, unless you need [place] sooner.  Patient advised of note above.

## 2019-04-07 LAB — PAIN MGMT, PROFILE 8 W/CONF, U
6 Acetylmorphine: NEGATIVE ng/mL
Alcohol Metabolites: POSITIVE ng/mL — AB (ref <500)
Amphetamines: NEGATIVE ng/mL
Benzodiazepines: NEGATIVE ng/mL
Buprenorphine, Urine: NEGATIVE ng/mL
Cocaine Metabolite: NEGATIVE ng/mL
Creatinine: 76.1 mg/dL
Ethyl Glucuronide (ETG): 4927 ng/mL
Ethyl Sulfate (ETS): 1571 ng/mL
MDA: NEGATIVE ng/mL
MDMA: NEGATIVE ng/mL
MDMA: NEGATIVE ng/mL
Marijuana Metabolite: NEGATIVE ng/mL
Opiates: NEGATIVE ng/mL
Oxidant: NEGATIVE ug/mL
Oxycodone: NEGATIVE ng/mL
pH: 5.3 (ref 4.5–9.0)

## 2019-04-13 ENCOUNTER — Telehealth: Payer: Self-pay | Admitting: *Deleted

## 2019-04-13 ENCOUNTER — Other Ambulatory Visit: Payer: Self-pay | Admitting: Family Medicine

## 2019-04-13 DIAGNOSIS — M1712 Unilateral primary osteoarthritis, left knee: Secondary | ICD-10-CM

## 2019-04-13 MED ORDER — OXYCODONE-ACETAMINOPHEN 5-325 MG PO TABS: 1.0000 | ORAL_TABLET | Freq: Three times a day (TID) | ORAL | 0 refills | Status: DC | PRN

## 2019-04-13 NOTE — Telephone Encounter (Signed)
Patient notified

## 2019-04-13 NOTE — Telephone Encounter (Signed)
Copied from [place] [phone]. Topic: General - Other >> Apr 12, 2019  3:01 PM Sheran Luz wrote: Patient is requesting call back from [facility] to discuss nurse visit on 10/7.

## 2019-04-13 NOTE — Telephone Encounter (Signed)
Patient would like refill on oxycodone  Last written: 02/15/19 Last ov: 02/15/19 Next ov: 08/17/19 Contract: 04/05/19 UDS: 04/05/19

## 2019-04-13 NOTE — Telephone Encounter (Signed)
Patient called about billed that he received from nurse visit 03/21/19 and since we made error in medication to waive copay.  Will await determination from [facility] to see what we can do.

## 2019-04-13 NOTE — Telephone Encounter (Signed)
done

## 2019-04-13 NOTE — Telephone Encounter (Signed)
Patient notified of status of what we will try to do.

## 2019-04-16 NOTE — Telephone Encounter (Signed)
Levon Hedger D, CMA        I have had this claim voided from Medicare and after processing the pt will no longer be responsible for the $10.00 copay. Please allow 2-4 weeks for reprocessing.     Left detailed message on machine.

## 2019-04-25 ENCOUNTER — Telehealth: Payer: Self-pay

## 2019-04-25 NOTE — Telephone Encounter (Signed)
May need to speak to pharmacy-- is there another dose we can call in?

## 2019-04-25 NOTE — Telephone Encounter (Signed)
Copied from [place] [phone]. Topic: General - Inquiry >> Apr 25, 2019  8:07 AM Richardo Priest, NT wrote: Reason for CRM: Pt called in stating he would like to speak with CMA in regards to fenofibrate 160 MG tablet, as it is in a [facility]. Pt would like to know what alternative he could possible have. Please advise.

## 2019-04-25 NOTE — Telephone Encounter (Signed)
Spoke with the pharmacy and they've been unable to get either other dosages.

## 2019-04-26 NOTE — Telephone Encounter (Signed)
Left VM with Dr. Nonda Lou recommendations and advised patient to call back if he wants the [place].

## 2019-04-26 NOTE — Telephone Encounter (Signed)
We can try vascepa 1000 mg 2 po bid #120  ----- or he can take otc fish oil---- like mega red

## 2019-04-27 NOTE — Telephone Encounter (Signed)
Patient notified of note and he will try the mega red.

## 2019-05-22 ENCOUNTER — Other Ambulatory Visit: Payer: Self-pay | Admitting: *Deleted

## 2019-05-22 DIAGNOSIS — E1151 Type 2 diabetes mellitus with diabetic peripheral angiopathy without gangrene: Secondary | ICD-10-CM

## 2019-05-22 DIAGNOSIS — IMO0002 Reserved for concepts with insufficient information to code with codable children: Secondary | ICD-10-CM

## 2019-05-22 MED ORDER — VICTOZA 18 MG/3ML ~~LOC~~ SOPN: PEN_INJECTOR | SUBCUTANEOUS | 5 refills | Status: DC

## 2019-05-22 NOTE — Telephone Encounter (Signed)
[facility] request from [facility] for victoza 18mg /36mL pen. Inject 1.8 mg under skin once daily. Refills sent.

## 2019-05-22 NOTE — Telephone Encounter (Addendum)
Received another fax requesting dose alternative to fenofibrate 160mg . Advised pharmacist, Abigail Butts per below. No alternative being sent at this time as pt is going to try otc Mega Red.

## 2019-06-01 ENCOUNTER — Other Ambulatory Visit: Payer: Self-pay | Admitting: Family Medicine

## 2019-06-01 NOTE — Telephone Encounter (Signed)
Last OV 04/05/19 Last refill 02/20/19 #120/2 Next OV 08/17/19

## 2019-06-11 ENCOUNTER — Other Ambulatory Visit: Payer: Self-pay | Admitting: Family Medicine

## 2019-08-08 ENCOUNTER — Other Ambulatory Visit: Payer: Self-pay | Admitting: *Deleted

## 2019-08-08 DIAGNOSIS — I10 Essential (primary) hypertension: Secondary | ICD-10-CM

## 2019-08-08 MED ORDER — AMLODIPINE BESYLATE 5 MG PO TABS: 5.0000 mg | ORAL_TABLET | Freq: Every day | ORAL | 1 refills | Status: DC

## 2019-08-16 ENCOUNTER — Other Ambulatory Visit: Payer: Self-pay

## 2019-08-17 ENCOUNTER — Encounter: Payer: Self-pay | Admitting: Family Medicine

## 2019-08-17 ENCOUNTER — Other Ambulatory Visit: Payer: Self-pay

## 2019-08-17 ENCOUNTER — Ambulatory Visit: Payer: BC Managed Care – PPO | Admitting: Family Medicine

## 2019-08-17 DIAGNOSIS — I10 Essential (primary) hypertension: Secondary | ICD-10-CM | POA: Diagnosis not present

## 2019-08-17 DIAGNOSIS — M1712 Unilateral primary osteoarthritis, left knee: Secondary | ICD-10-CM | POA: Diagnosis not present

## 2019-08-17 DIAGNOSIS — E785 Hyperlipidemia, unspecified: Secondary | ICD-10-CM

## 2019-08-17 DIAGNOSIS — E1151 Type 2 diabetes mellitus with diabetic peripheral angiopathy without gangrene: Secondary | ICD-10-CM | POA: Diagnosis not present

## 2019-08-17 DIAGNOSIS — IMO0002 Reserved for concepts with insufficient information to code with codable children: Secondary | ICD-10-CM

## 2019-08-17 DIAGNOSIS — E1165 Type 2 diabetes mellitus with hyperglycemia: Secondary | ICD-10-CM | POA: Diagnosis not present

## 2019-08-17 LAB — COMPREHENSIVE METABOLIC PANEL
ALT: 12 U/L (ref 0–53)
AST: 14 U/L (ref 0–37)
Albumin: 4 g/dL (ref 3.5–5.2)
Alkaline Phosphatase: 54 U/L (ref 39–117)
BUN: 14 mg/dL (ref 6–23)
CO2: 30 mEq/L (ref 19–32)
Calcium: 9.4 mg/dL (ref 8.4–10.5)
Chloride: 98 mEq/L (ref 96–112)
Creatinine, Ser: 0.91 mg/dL (ref 0.40–1.50)
GFR: 100.68 mL/min (ref >60.00)
Glucose, Bld: 97 mg/dL (ref 70–99)
Potassium: 4.2 mEq/L (ref 3.5–5.1)
Sodium: 135 mEq/L (ref 135–145)
Total Bilirubin: 0.9 mg/dL (ref 0.2–1.2)
Total Protein: 7.5 g/dL (ref 6.0–8.3)

## 2019-08-17 LAB — LIPID PANEL
Cholesterol: 127 mg/dL (ref 0–200)
HDL: 50.2 mg/dL (ref >39.00)
LDL Cholesterol: 58 mg/dL (ref 0–99)
NonHDL: 77.05
Total CHOL/HDL Ratio: 3
Triglycerides: 96 mg/dL (ref 0.0–149.0)
VLDL: 19.2 mg/dL (ref 0.0–40.0)

## 2019-08-17 LAB — HEMOGLOBIN A1C: Hgb A1c MFr Bld: 6.3 % (ref 4.6–6.5)

## 2019-08-17 MED ORDER — ATORVASTATIN CALCIUM 40 MG PO TABS: 40.0000 mg | ORAL_TABLET | Freq: Every day | ORAL | 1 refills | Status: DC

## 2019-08-17 MED ORDER — VICTOZA 18 MG/3ML ~~LOC~~ SOPN: PEN_INJECTOR | SUBCUTANEOUS | 5 refills | Status: DC

## 2019-08-17 MED ORDER — AMLODIPINE BESYLATE 5 MG PO TABS: 5.0000 mg | ORAL_TABLET | Freq: Every day | ORAL | 1 refills | Status: DC

## 2019-08-17 MED ORDER — METFORMIN HCL 500 MG PO TABS: 1000.0000 mg | ORAL_TABLET | Freq: Two times a day (BID) | ORAL | 1 refills | Status: DC

## 2019-08-17 MED ORDER — OXYCODONE-ACETAMINOPHEN 5-325 MG PO TABS: 1.0000 | ORAL_TABLET | Freq: Three times a day (TID) | ORAL | 0 refills | Status: DC | PRN

## 2019-08-17 MED ORDER — IRBESARTAN-HYDROCHLOROTHIAZIDE 300-12.5 MG PO TABS: 1.0000 | ORAL_TABLET | Freq: Every day | ORAL | 1 refills | Status: DC

## 2019-08-17 MED ORDER — FARXIGA 5 MG PO TABS: 5.0000 mg | ORAL_TABLET | Freq: Every day | ORAL | 1 refills | Status: DC

## 2019-08-17 MED ORDER — GLUCOSE BLOOD VI STRP: ORAL_STRIP | 1 refills | Status: DC

## 2019-08-17 NOTE — Assessment & Plan Note (Signed)
Encouraged heart healthy diet, increase exercise, avoid trans fats, consider a krill oil cap daily 

## 2019-08-17 NOTE — Assessment & Plan Note (Signed)
hgba1c to be checked, minimize simple carbs. Increase exercise as tolerated. Continue current meds  

## 2019-08-17 NOTE — Progress Notes (Addendum)
Patient ID: Richard [name], male    DOB: 1950-07-24  Age: 69 y.o. MRN: [id number]    Subjective:  Subjective  HPI Trevor Wilkie presents for here f/u bp, chol and dm No complaints  HYPERTENSION   Blood pressure range-  Chest pain- no      Dyspnea- no Lightheadedness- no   Edema- no  Other side effects - no Medication compliance: good Low salt diet- yes     DIABETES    Blood Sugar ranges-103-199 Polyuria- no New Visual problems- no  Hypoglycemic symptoms- no  Other side effects-no Medication compliance - good Last eye exam- due Foot exam- today   HYPERLIPIDEMIA  Medication compliance- good RUQ pain- no  Muscle aches- no Other side effects-no      Review of Systems  Constitutional: Negative.  Negative for appetite change, diaphoresis, fatigue and unexpected weight change.  HENT: Negative for congestion, ear pain, hearing loss, nosebleeds, postnasal drip, rhinorrhea, sinus pressure, sneezing and tinnitus.   Eyes: Negative for photophobia, pain, discharge, redness, itching and visual disturbance.  Respiratory: Negative.  Negative for cough, chest tightness, shortness of breath and wheezing.   Cardiovascular: Negative.  Negative for chest pain, palpitations and leg swelling.  Gastrointestinal: Negative for abdominal distention, abdominal pain, anal bleeding, blood in stool and constipation.  Endocrine: Negative.  Negative for cold intolerance, heat intolerance, polydipsia, polyphagia and polyuria.  Genitourinary: Negative.  Negative for difficulty urinating, dysuria and frequency.  Musculoskeletal: Negative.   Skin: Negative.   Allergic/Immunologic: Negative.   Neurological: Negative for dizziness, weakness, light-headedness, numbness and headaches.  Psychiatric/Behavioral: Negative for agitation, confusion, decreased concentration, dysphoric mood, sleep disturbance and suicidal ideas. The patient is not nervous/anxious.     History Past Medical History:  Diagnosis  Date  . Allergy    seasonal  . Arthritis     left knee,  right hip  . Complication of anesthesia    half awake during last colonscopy  . Diabetes mellitus    type 2  . Hx of adenomatous colonic polyps    2010 6 mm adenoma 10/06/2016 2 5 mm descending polyps   . Hyperlipidemia   . Hypertension     He has a past surgical history that includes Colonoscopy and Colonoscopy with propofol (N/A, 10/06/2016).   His family history includes Diabetes in his brother, mother, and sister; Hypertension in his sister; Stroke in his sister.He reports that he has never smoked. He has never used smokeless tobacco. He reports current alcohol use. He reports that he does not use drugs.  Current Outpatient Medications on File Prior to Visit  Medication Sig Dispense Refill  . blood glucose meter kit and supplies KIT Dispense based on patient and insurance preference. Use up to four times daily as directed. (FOR ICD-9 250.00, 250.01). 1 each 0  . fenofibrate 160 MG tablet Take 1 tablet (160 mg total) by mouth daily. 90 tablet 1  . Insulin Pen Needle (B-D UF III MINI PEN NEEDLES) 31G X 5 MM MISC USE AS DIRECTED 100 each 1   No current facility-administered medications on file prior to visit.     Objective:  Objective  Physical Exam Vitals and nursing note reviewed.  Constitutional:      General: He is sleeping.     Appearance: He is well-developed.  HENT:     Head: Normocephalic and atraumatic.  Eyes:     Pupils: Pupils are equal, round, and reactive to light.  Neck:     Thyroid: No thyromegaly.  Cardiovascular:  Rate and Rhythm: Normal rate and regular rhythm.     Heart sounds: No murmur.  Pulmonary:     Effort: Pulmonary effort is normal. No respiratory distress.     Breath sounds: Normal breath sounds. No wheezing or rales.  Chest:     Chest wall: No tenderness.  Musculoskeletal:        General: No tenderness.     Cervical back: Normal range of motion and neck supple.  Skin:     General: Skin is warm and dry.  Neurological:     Mental Status: He is oriented to person, place, and time.  Psychiatric:        Behavior: Behavior normal.        Thought Content: Thought content normal.        Judgment: Judgment normal.    Diabetic Foot Exam - Simple   Simple Foot Form Diabetic Foot exam was performed with the following findings: Yes 08/17/2019 12:32 PM  Visual Inspection No deformities, no ulcerations, no other skin breakdown bilaterally: Yes Sensation Testing Intact to touch and monofilament testing bilaterally: Yes Pulse Check Posterior Tibialis and Dorsalis pulse intact bilaterally: Yes Comments     BP 130/80 (BP Location: Right Arm, Patient Position: Sitting, Cuff Size: Large)   Pulse 92   Temp (!) 97.3 F (36.3 C) (Temporal)   Resp 20   Ht 6' (1.829 m)   Wt (!) 350 lb 12.8 oz (159.1 kg)   SpO2 95%   BMI 47.58 kg/m  Wt Readings from Last 3 Encounters:  08/17/19 (!) 350 lb 12.8 oz (159.1 kg)  02/15/19 (!) 351 lb 9.6 oz (159.5 kg)  08/15/18 (!) 364 lb (165.1 kg)     Lab Results  Component Value Date   WBC 7.0 02/06/2013   HGB 15.4 02/06/2013   HCT 45.3 02/06/2013   PLT 234.0 02/06/2013   GLUCOSE 99 04/05/2019   CHOL 128 02/15/2019   TRIG 86.0 02/15/2019   HDL 46.20 02/15/2019   LDLDIRECT 133.0 12/05/2017   LDLCALC 65 02/15/2019   ALT 14 02/15/2019   AST 15 02/15/2019   NA 138 04/05/2019   K 4.2 04/05/2019   CL 101 04/05/2019   CREATININE 0.96 04/05/2019   BUN 15 04/05/2019   CO2 27 04/05/2019   TSH 0.75 01/27/2009   PSA 0.29 01/27/2009   HGBA1C 6.7 (H) 02/15/2019   MICROALBUR 1.0 04/29/2014    No results found.   Assessment & Plan:  Plan  I am having [name] "Louie Casa" maintain his blood glucose meter kit and supplies, fenofibrate, B-D UF III MINI PEN NEEDLES, oxyCODONE-acetaminophen, metFORMIN, Farxiga, Victoza, irbesartan-hydrochlorothiazide, atorvastatin, amLODipine, and glucose blood.  Meds ordered this encounter    Medications  . DISCONTD: atorvastatin (LIPITOR) 40 MG tablet    Sig: Take 1 tablet (40 mg total) by mouth at bedtime.    Dispense:  90 tablet    Refill:  1  . DISCONTD: amLODipine (NORVASC) 5 MG tablet    Sig: Take 1 tablet (5 mg total) by mouth daily.    Dispense:  90 tablet    Refill:  1    Dx Code: I10  . oxyCODONE-acetaminophen (ROXICET) 5-325 MG tablet    Sig: Take 1 tablet by mouth every 8 (eight) hours as needed for severe pain.    Dispense:  21 tablet    Refill:  0  . DISCONTD: glucose blood (ONE TOUCH ULTRA TEST) test strip    Sig: TEST BLOOD SUGAR ONCE DAILY.  Dx  code: E11.9    Dispense:  100 each    Refill:  1  . DISCONTD: irbesartan-hydrochlorothiazide (AVALIDE) 300-12.5 MG tablet    Sig: Take 1 tablet by mouth daily.    Dispense:  90 tablet    Refill:  1  . DISCONTD: dapagliflozin propanediol (FARXIGA) 5 MG TABS tablet    Sig: Take 5 mg by mouth daily.    Dispense:  90 tablet    Refill:  1  . DISCONTD: metFORMIN (GLUCOPHAGE) 500 MG tablet    Sig: Take 2 tablets (1,000 mg total) by mouth 2 (two) times daily with a meal.    Dispense:  360 tablet    Refill:  1  . DISCONTD: liraglutide (VICTOZA) 18 MG/3ML SOPN    Sig: INJECT 1.8 MG UNDER THE SKIN ONCE DAILY    Dispense:  9 mL    Refill:  5  . metFORMIN (GLUCOPHAGE) 500 MG tablet    Sig: Take 2 tablets (1,000 mg total) by mouth 2 (two) times daily with a meal.    Dispense:  360 tablet    Refill:  1  . dapagliflozin propanediol (FARXIGA) 5 MG TABS tablet    Sig: Take 5 mg by mouth daily.    Dispense:  90 tablet    Refill:  1  . liraglutide (VICTOZA) 18 MG/3ML SOPN    Sig: INJECT 1.8 MG UNDER THE SKIN ONCE DAILY    Dispense:  9 mL    Refill:  5  . irbesartan-hydrochlorothiazide (AVALIDE) 300-12.5 MG tablet    Sig: Take 1 tablet by mouth daily.    Dispense:  90 tablet    Refill:  1  . atorvastatin (LIPITOR) 40 MG tablet    Sig: Take 1 tablet (40 mg total) by mouth at bedtime.    Dispense:  90 tablet     Refill:  1  . amLODipine (NORVASC) 5 MG tablet    Sig: Take 1 tablet (5 mg total) by mouth daily.    Dispense:  90 tablet    Refill:  1    Dx Code: I10  . glucose blood (ONE TOUCH ULTRA TEST) test strip    Sig: TEST BLOOD SUGAR ONCE DAILY.  Dx code: E11.9    Dispense:  100 each    Refill:  1    Problem List Items Addressed This Visit      Unprioritized   DM (diabetes mellitus) type II uncontrolled, periph vascular disorder ([place])    hgba1c to be checked , minimize simple carbs. Increase exercise as tolerated. Continue current meds       Relevant Medications   metFORMIN (GLUCOPHAGE) 500 MG tablet   dapagliflozin propanediol (FARXIGA) 5 MG TABS tablet   liraglutide (VICTOZA) 18 MG/3ML SOPN   irbesartan-hydrochlorothiazide (AVALIDE) 300-12.5 MG tablet   atorvastatin (LIPITOR) 40 MG tablet   amLODipine (NORVASC) 5 MG tablet   glucose blood (ONE TOUCH ULTRA TEST) test strip   Other Relevant Orders   Hemoglobin A1c   Comprehensive metabolic panel   Essential hypertension    Well controlled, no changes to meds. Encouraged heart healthy diet such as the DASH diet and exercise as tolerated.       Relevant Medications   irbesartan-hydrochlorothiazide (AVALIDE) 300-12.5 MG tablet   atorvastatin (LIPITOR) 40 MG tablet   amLODipine (NORVASC) 5 MG tablet   Other Relevant Orders   Lipid panel   Comprehensive metabolic panel   Hyperlipidemia LDL goal <70    Encouraged heart healthy diet, increase exercise,  avoid trans fats, consider a krill oil cap daily      Relevant Medications   irbesartan-hydrochlorothiazide (AVALIDE) 300-12.5 MG tablet   atorvastatin (LIPITOR) 40 MG tablet   amLODipine (NORVASC) 5 MG tablet    Other Visit Diagnoses    Hyperlipidemia, unspecified hyperlipidemia type       Relevant Medications   irbesartan-hydrochlorothiazide (AVALIDE) 300-12.5 MG tablet   atorvastatin (LIPITOR) 40 MG tablet   amLODipine (NORVASC) 5 MG tablet   Other Relevant Orders    Lipid panel   Comprehensive metabolic panel   Primary osteoarthritis of left knee       Relevant Medications   oxyCODONE-acetaminophen (ROXICET) 5-325 MG tablet      Database reviewed -----uds / contract utd  Follow-up: Return in about 6 months (around 02/17/2020), or if symptoms worsen or fail to improve, for annual exam, fasting.  Ann Held, DO

## 2019-08-17 NOTE — Assessment & Plan Note (Signed)
Well controlled, no changes to meds. Encouraged heart healthy diet such as the DASH diet and exercise as tolerated.  °

## 2019-08-17 NOTE — Patient Instructions (Signed)
DASH Eating Plan DASH stands for "Dietary Approaches to Stop Hypertension." The DASH eating plan is a healthy eating plan that has been shown to reduce high blood pressure (hypertension). It may also reduce your risk for type 2 diabetes, heart disease, and stroke. The DASH eating plan may also help with weight loss. What are tips for following this plan?  General guidelines  Avoid eating more than 2,300 mg (milligrams) of salt (sodium) a day. If you have hypertension, you may need to reduce your sodium intake to 1,500 mg a day.  Limit alcohol intake to no more than 1 drink a day for nonpregnant women and 2 drinks a day for men. One drink equals 12 oz of beer, 5 oz of wine, or 1 oz of hard liquor.  Work with your health care provider to maintain a healthy body weight or to lose weight. Ask what an ideal weight is for you.  Get at least 30 minutes of exercise that causes your heart to beat faster (aerobic exercise) most days of the week. Activities may include walking, swimming, or biking.  Work with your health care provider or diet and nutrition specialist (dietitian) to adjust your eating plan to your individual calorie needs. Reading food labels   Check food labels for the amount of sodium per serving. Choose foods with less than 5 percent of the Daily Value of sodium. Generally, foods with less than 300 mg of sodium per serving fit into this eating plan.  To find whole grains, look for the word "whole" as the first word in the ingredient list. Shopping  Buy products labeled as "low-sodium" or "no salt added."  Buy fresh foods. Avoid canned foods and premade or frozen meals. Cooking  Avoid adding salt when cooking. Use salt-free seasonings or herbs instead of table salt or sea salt. Check with your health care provider or pharmacist before using salt substitutes.  Do not fry foods. Cook foods using healthy methods such as baking, boiling, grilling, and broiling instead.  Cook with  heart-healthy oils, such as olive, canola, soybean, or sunflower oil. Meal planning  Eat a balanced diet that includes: ? 5 or more servings of fruits and vegetables each day. At each meal, try to fill half of your plate with fruits and vegetables. ? Up to 6-8 servings of whole grains each day. ? Less than 6 oz of lean meat, poultry, or fish each day. A 3-oz serving of meat is about the same size as a deck of cards. One egg equals 1 oz. ? 2 servings of low-fat dairy each day. ? A serving of nuts, seeds, or beans 5 times each week. ? Heart-healthy fats. Healthy fats called Omega-3 fatty acids are found in foods such as flaxseeds and coldwater fish, like sardines, salmon, and mackerel.  Limit how much you eat of the following: ? Canned or prepackaged foods. ? Food that is high in trans fat, such as fried foods. ? Food that is high in saturated fat, such as fatty meat. ? Sweets, desserts, sugary drinks, and other foods with added sugar. ? Full-fat dairy products.  Do not salt foods before eating.  Try to eat at least 2 vegetarian meals each week.  Eat more home-cooked food and less restaurant, buffet, and fast food.  When eating at a restaurant, ask that your food be prepared with less salt or no salt, if possible. What foods are recommended? The items listed may not be a complete list. Talk with your dietitian about   what dietary choices are best for you. Grains Whole-grain or whole-wheat bread. Whole-grain or whole-wheat pasta. Brown rice. Oatmeal. Quinoa. Bulgur. Whole-grain and low-sodium cereals. Pita bread. Low-fat, low-sodium crackers. Whole-wheat flour tortillas. Vegetables Fresh or frozen vegetables (raw, steamed, roasted, or grilled). Low-sodium or reduced-sodium tomato and vegetable juice. Low-sodium or reduced-sodium tomato sauce and tomato paste. Low-sodium or reduced-sodium canned vegetables. Fruits All fresh, dried, or frozen fruit. Canned fruit in natural juice (without  added sugar). Meat and other protein foods Skinless chicken or turkey. Ground chicken or turkey. Pork with fat trimmed off. Fish and seafood. Egg whites. Dried beans, peas, or lentils. Unsalted nuts, nut butters, and seeds. Unsalted canned beans. Lean cuts of beef with fat trimmed off. Low-sodium, lean deli meat. Dairy Low-fat (1%) or fat-free (skim) milk. Fat-free, low-fat, or reduced-fat cheeses. Nonfat, low-sodium ricotta or cottage cheese. Low-fat or nonfat yogurt. Low-fat, low-sodium cheese. Fats and oils Soft margarine without trans fats. Vegetable oil. Low-fat, reduced-fat, or light mayonnaise and salad dressings (reduced-sodium). Canola, safflower, olive, soybean, and sunflower oils. Avocado. Seasoning and other foods Herbs. Spices. Seasoning mixes without salt. Unsalted popcorn and pretzels. Fat-free sweets. What foods are not recommended? The items listed may not be a complete list. Talk with your dietitian about what dietary choices are best for you. Grains Baked goods made with fat, such as croissants, muffins, or some breads. Dry pasta or rice meal packs. Vegetables Creamed or fried vegetables. Vegetables in a cheese sauce. Regular canned vegetables (not low-sodium or reduced-sodium). Regular canned tomato sauce and paste (not low-sodium or reduced-sodium). Regular tomato and vegetable juice (not low-sodium or reduced-sodium). Pickles. Olives. Fruits Canned fruit in a light or heavy syrup. Fried fruit. Fruit in cream or butter sauce. Meat and other protein foods Fatty cuts of meat. Ribs. Fried meat. Bacon. Sausage. Bologna and other processed lunch meats. Salami. Fatback. Hotdogs. Bratwurst. Salted nuts and seeds. Canned beans with added salt. Canned or smoked fish. Whole eggs or egg yolks. Chicken or turkey with skin. Dairy Whole or 2% milk, cream, and half-and-half. Whole or full-fat cream cheese. Whole-fat or sweetened yogurt. Full-fat cheese. Nondairy creamers. Whipped toppings.  Processed cheese and cheese spreads. Fats and oils Butter. Stick margarine. Lard. Shortening. Ghee. Bacon fat. Tropical oils, such as coconut, palm kernel, or palm oil. Seasoning and other foods Salted popcorn and pretzels. Onion salt, garlic salt, seasoned salt, table salt, and sea salt. Worcestershire sauce. Tartar sauce. Barbecue sauce. Teriyaki sauce. Soy sauce, including reduced-sodium. Steak sauce. Canned and packaged gravies. Fish sauce. Oyster sauce. Cocktail sauce. Horseradish that you find on the shelf. Ketchup. Mustard. Meat flavorings and tenderizers. Bouillon cubes. Hot sauce and Tabasco sauce. Premade or packaged marinades. Premade or packaged taco seasonings. Relishes. Regular salad dressings. Where to find more information:  National Heart, Lung, and Blood Institute: www.nhlbi.nih.gov  American Heart Association: www.heart.org Summary  The DASH eating plan is a healthy eating plan that has been shown to reduce high blood pressure (hypertension). It may also reduce your risk for type 2 diabetes, heart disease, and stroke.  With the DASH eating plan, you should limit salt (sodium) intake to 2,300 mg a day. If you have hypertension, you may need to reduce your sodium intake to 1,500 mg a day.  When on the DASH eating plan, aim to eat more fresh fruits and vegetables, whole grains, lean proteins, low-fat dairy, and heart-healthy fats.  Work with your health care provider or diet and nutrition specialist (dietitian) to adjust your eating plan to your   individual calorie needs. This information is not intended to replace advice given to you by your health care provider. Make sure you discuss any questions you have with your health care provider. Document Revised: 05/13/2017 Document Reviewed: 05/24/2016 Elsevier Patient Education  2020 Else[employer] Inc.  

## 2019-10-22 ENCOUNTER — Other Ambulatory Visit: Payer: Self-pay | Admitting: Family Medicine

## 2019-10-22 DIAGNOSIS — M1712 Unilateral primary osteoarthritis, left knee: Secondary | ICD-10-CM

## 2019-10-22 MED ORDER — OXYCODONE-ACETAMINOPHEN 5-325 MG PO TABS: 1.0000 | ORAL_TABLET | Freq: Three times a day (TID) | ORAL | 0 refills | Status: DC | PRN

## 2019-10-22 NOTE — Telephone Encounter (Signed)
Oxycodone refill.   Last OV: 08/17/2019 Last Fill: 08/17/2019 #21 and 0RF Pt sig: 1 tab q8h prn UDS: 04/05/2019 + alcohol

## 2019-11-30 ENCOUNTER — Telehealth: Payer: Self-pay | Admitting: Family Medicine

## 2019-11-30 DIAGNOSIS — IMO0002 Reserved for concepts with insufficient information to code with codable children: Secondary | ICD-10-CM

## 2019-11-30 MED ORDER — BD PEN NEEDLE MINI U/F 31G X 5 MM MISC: 1 refills | Status: DC

## 2019-11-30 NOTE — Telephone Encounter (Signed)
Refill sent.

## 2019-11-30 NOTE — Telephone Encounter (Signed)
Medication: Insulin Pen Needle (B-D UF III MINI PEN NEEDLES) 31G X 5 MM MISC       Has the patient contacted their pharmacy?  (If no, request that the patient contact the pharmacy for the refill.) (If yes, when and what did the pharmacy advise?)     Preferred Pharmacy (with phone number or street name): [id number] - [place], [address]  [place], [place] [address]  Phone:  [phone] Fax:  [phone]      Agent: Please be advised that RX refills may take up to 3 business days. We ask that you follow-up with your pharmacy.

## 2020-01-09 ENCOUNTER — Other Ambulatory Visit: Payer: Self-pay | Admitting: Family Medicine

## 2020-01-09 DIAGNOSIS — M1712 Unilateral primary osteoarthritis, left knee: Secondary | ICD-10-CM

## 2020-01-09 MED ORDER — OXYCODONE-ACETAMINOPHEN 5-325 MG PO TABS: 1.0000 | ORAL_TABLET | Freq: Three times a day (TID) | ORAL | 0 refills | Status: DC | PRN

## 2020-01-09 NOTE — Telephone Encounter (Signed)
Oxycodone refill.   Last OV: 08/17/2019 Last Fill: 10/22/2019 #21 and 0RF Pt sig: 1 tab q8h prn UDS: 04/05/2019 +alcohol

## 2020-01-16 ENCOUNTER — Telehealth: Payer: Self-pay

## 2020-01-16 NOTE — Telephone Encounter (Signed)
PA initiated via Covermymeds; KEY: [id number]. Awaiting determination.

## 2020-01-17 NOTE — Telephone Encounter (Signed)
PA approved. Effective 01/16/2020 to 07/18/2020.

## 2020-02-21 ENCOUNTER — Ambulatory Visit (HOSPITAL_BASED_OUTPATIENT_CLINIC_OR_DEPARTMENT_OTHER)
Admission: RE | Admit: 2020-02-21 | Discharge: 2020-02-21 | Disposition: A | Discharge status: Home / Self Care | Payer: BC Managed Care – PPO | Source: Ambulatory Visit | Attending: Family Medicine | Admitting: Family Medicine

## 2020-02-21 ENCOUNTER — Ambulatory Visit (HOSPITAL_COMMUNITY): Payer: BC Managed Care – PPO

## 2020-02-21 ENCOUNTER — Ambulatory Visit: Payer: BC Managed Care – PPO | Admitting: Family Medicine

## 2020-02-21 ENCOUNTER — Encounter: Payer: Self-pay | Admitting: Family Medicine

## 2020-02-21 ENCOUNTER — Other Ambulatory Visit: Payer: Self-pay

## 2020-02-21 VITALS — BP 154/80 | HR 103 | Temp 98.0°F | Resp 20 | Ht 72.0 in | Wt 338.8 lb

## 2020-02-21 DIAGNOSIS — E1169 Type 2 diabetes mellitus with other specified complication: Secondary | ICD-10-CM | POA: Diagnosis not present

## 2020-02-21 DIAGNOSIS — M25551 Pain in right hip: Secondary | ICD-10-CM | POA: Diagnosis present

## 2020-02-21 DIAGNOSIS — E118 Type 2 diabetes mellitus with unspecified complications: Secondary | ICD-10-CM | POA: Insufficient documentation

## 2020-02-21 DIAGNOSIS — I10 Essential (primary) hypertension: Secondary | ICD-10-CM | POA: Diagnosis not present

## 2020-02-21 DIAGNOSIS — IMO0002 Reserved for concepts with insufficient information to code with codable children: Secondary | ICD-10-CM

## 2020-02-21 DIAGNOSIS — E785 Hyperlipidemia, unspecified: Secondary | ICD-10-CM

## 2020-02-21 DIAGNOSIS — M1712 Unilateral primary osteoarthritis, left knee: Secondary | ICD-10-CM

## 2020-02-21 DIAGNOSIS — E1165 Type 2 diabetes mellitus with hyperglycemia: Secondary | ICD-10-CM

## 2020-02-21 DIAGNOSIS — E1151 Type 2 diabetes mellitus with diabetic peripheral angiopathy without gangrene: Secondary | ICD-10-CM

## 2020-02-21 MED ORDER — OXYCODONE-ACETAMINOPHEN 5-325 MG PO TABS: 1.0000 | ORAL_TABLET | Freq: Three times a day (TID) | ORAL | 0 refills | Status: DC | PRN

## 2020-02-21 MED ORDER — ATORVASTATIN CALCIUM 40 MG PO TABS: 40.0000 mg | ORAL_TABLET | Freq: Every day | ORAL | 1 refills | Status: DC

## 2020-02-21 MED ORDER — BD PEN NEEDLE MINI U/F 31G X 5 MM MISC: 1 refills | Status: DC

## 2020-02-21 MED ORDER — AMLODIPINE BESYLATE 5 MG PO TABS: 5.0000 mg | ORAL_TABLET | Freq: Every day | ORAL | 1 refills | Status: DC

## 2020-02-21 MED ORDER — GLUCOSE BLOOD VI STRP: ORAL_STRIP | 1 refills | Status: DC

## 2020-02-21 MED ORDER — OLMESARTAN MEDOXOMIL-HCTZ 40-25 MG PO TABS: 1.0000 | ORAL_TABLET | Freq: Every day | ORAL | 1 refills | Status: DC

## 2020-02-21 MED ORDER — DAPAGLIFLOZIN PROPANEDIOL 5 MG PO TABS: 5.0000 mg | ORAL_TABLET | Freq: Every day | ORAL | 1 refills | Status: DC

## 2020-02-21 NOTE — Assessment & Plan Note (Signed)
hgba1c to be checked, minimize simple carbs. Increase exercise as tolerated. Continue current meds  

## 2020-02-21 NOTE — Assessment & Plan Note (Signed)
Suspect lumbar etiology-- check xray con't pain med

## 2020-02-21 NOTE — Progress Notes (Signed)
Patient ID: Richard [name], male    DOB: [birth year]  Age: 69 y.o. MRN: [id number]    Subjective:  Subjective  HPI Richard [name] presents for f/u dm, chol and bp     He also c/o R hip pain that radiates down his R thigh--- no known injury Pain worsening over last few weeks----if he tries to straighten his leg it hurts worse  He ran out of his norvasc a few weeks ago and the pharmacy refilled irbasartan when he asked for olmesartan refilled   HYPERTENSION   Blood pressure range-140-160/ 80-90  Chest pain- no      Dyspnea- no Lightheadedness- no   Edema- no  Other side effects - no   Medication compliance: good Low salt diet- yes    DIABETES    Blood Sugar ranges-pt brought in bs readings from home 135 was highest  Polyuria- no New Visual problems- no  Hypoglycemic symptoms- no  Other side effects-no Medication compliance - good Last eye exam- last week    HYPERLIPIDEMIA  Medication compliance- good RUQ pain- no  Muscle aches- no Other side effects-no    Review of Systems  Constitutional: Negative for appetite change, diaphoresis, fatigue and unexpected weight change.  Eyes: Negative for pain, redness and visual disturbance.  Respiratory: Negative for cough, chest tightness, shortness of breath and wheezing.   Cardiovascular: Negative for chest pain, palpitations and leg swelling.  Endocrine: Negative for cold intolerance, heat intolerance, polydipsia, polyphagia and polyuria.  Genitourinary: Negative for difficulty urinating, dysuria and frequency.  Musculoskeletal: Positive for arthralgias and gait problem.  Neurological: Negative for dizziness, light-headedness, numbness and headaches.    History Past Medical History:  Diagnosis Date  . Allergy    seasonal  . Arthritis     left knee,  right hip  . Complication of anesthesia    half awake during last colonscopy  . Diabetes mellitus    type 2  . Hx of adenomatous colonic polyps    2010 6 mm adenoma 10/06/2016  2 5 mm descending polyps   . Hyperlipidemia   . Hypertension     He has a past surgical history that includes Colonoscopy and Colonoscopy with propofol (N/A, 10/06/2016).   His family history includes Diabetes in his brother, mother, and sister; Hypertension in his sister; Stroke in his sister.He reports that he has never smoked. He has never used smokeless tobacco. He reports current alcohol use. He reports that he does not use drugs.  Current Outpatient Medications on File Prior to Visit  Medication Sig Dispense Refill  . blood glucose meter kit and supplies KIT Dispense based on patient and insurance preference. Use up to four times daily as directed. (FOR ICD-9 250.00, 250.01). 1 each 0  . fenofibrate 160 MG tablet Take 1 tablet (160 mg total) by mouth daily. 90 tablet 1  . liraglutide (VICTOZA) 18 MG/3ML SOPN INJECT 1.8 MG UNDER THE SKIN ONCE DAILY 9 mL 5  . metFORMIN (GLUCOPHAGE) 500 MG tablet Take 2 tablets (1,000 mg total) by mouth 2 (two) times daily with a meal. 360 tablet 1   No current facility-administered medications on file prior to visit.     Objective:  Objective  Physical Exam Vitals and nursing note reviewed.  Constitutional:      General: He is sleeping.     Appearance: He is well-developed.  HENT:     Head: Normocephalic and atraumatic.  Eyes:     Pupils: Pupils are equal, round, and reactive to light.  Neck:     Thyroid: No thyromegaly.  Cardiovascular:     Rate and Rhythm: Normal rate and regular rhythm.     Heart sounds: No murmur heard.   Pulmonary:     Effort: Pulmonary effort is normal. No respiratory distress.     Breath sounds: Normal breath sounds. No wheezing or rales.  Chest:     Chest wall: No tenderness.  Musculoskeletal:        General: No tenderness.     Cervical back: Normal range of motion and neck supple.  Skin:    General: Skin is warm and dry.  Neurological:     Mental Status: He is oriented to person, place, and time.     Motor:  Weakness present.     Coordination: Coordination normal.     Gait: Gait abnormal.     Comments: Pain R hip / R thigh   Psychiatric:        Behavior: Behavior normal.        Thought Content: Thought content normal.        Judgment: Judgment normal.    BP (!) 154/80 (BP Location: Left Arm, Patient Position: Sitting, Cuff Size: Large)   Pulse (!) 103   Temp 98 F (36.7 C) (Oral)   Resp 20   Ht 6' (1.829 m)   Wt (!) 338 lb 12.8 oz (153.7 kg)   SpO2 98%   BMI 45.95 kg/m  Wt Readings from Last 3 Encounters:  02/21/20 (!) 338 lb 12.8 oz (153.7 kg)  08/17/19 (!) 350 lb 12.8 oz (159.1 kg)  02/15/19 (!) 351 lb 9.6 oz (159.5 kg)     Lab Results  Component Value Date   WBC 7.0 02/06/2013   HGB 15.4 02/06/2013   HCT 45.3 02/06/2013   PLT 234.0 02/06/2013   GLUCOSE 97 08/17/2019   CHOL 127 08/17/2019   TRIG 96.0 08/17/2019   HDL 50.20 08/17/2019   LDLDIRECT 133.0 12/05/2017   LDLCALC 58 08/17/2019   ALT 12 08/17/2019   AST 14 08/17/2019   NA 135 08/17/2019   K 4.2 08/17/2019   CL 98 08/17/2019   CREATININE 0.91 08/17/2019   BUN 14 08/17/2019   CO2 30 08/17/2019   TSH 0.75 01/27/2009   PSA 0.29 01/27/2009   HGBA1C 6.3 08/17/2019   MICROALBUR 1.0 04/29/2014    No results found.   Assessment & Plan:  Plan  I have discontinued [name]'s irbesartan-hydrochlorothiazide. I have also changed his Wilder Glade to dapagliflozin propanediol. Additionally, I am having him start on olmesartan-hydrochlorothiazide. Lastly, I am having him maintain his blood glucose meter kit and supplies, fenofibrate, metFORMIN, Victoza, oxyCODONE-acetaminophen, amLODipine, B-D UF III MINI PEN NEEDLES, glucose blood, and atorvastatin.  Meds ordered this encounter  Medications  . oxyCODONE-acetaminophen (ROXICET) 5-325 MG tablet    Sig: Take 1 tablet by mouth every 8 (eight) hours as needed for severe pain.    Dispense:  63 tablet    Refill:  0  . amLODipine (NORVASC) 5 MG tablet    Sig:  Take 1 tablet (5 mg total) by mouth daily.    Dispense:  90 tablet    Refill:  1    Dx Code: I10  . olmesartan-hydrochlorothiazide (BENICAR HCT) 40-25 MG tablet    Sig: Take 1 tablet by mouth daily.    Dispense:  90 tablet    Refill:  1    Please cancel irbesartan/ hct  . Insulin Pen Needle (B-D UF III MINI PEN  NEEDLES) 31G X 5 MM MISC    Sig: USE AS DIRECTED    Dispense:  100 each    Refill:  1  . glucose blood (ONE TOUCH ULTRA TEST) test strip    Sig: TEST BLOOD SUGAR ONCE DAILY.  Dx code: E11.9    Dispense:  100 each    Refill:  1  . atorvastatin (LIPITOR) 40 MG tablet    Sig: Take 1 tablet (40 mg total) by mouth at bedtime.    Dispense:  90 tablet    Refill:  1  . dapagliflozin propanediol (FARXIGA) 5 MG TABS tablet    Sig: Take 1 tablet (5 mg total) by mouth daily.    Dispense:  90 tablet    Refill:  1    Problem List Items Addressed This Visit      Unprioritized   DM (diabetes mellitus) type II uncontrolled, periph vascular disorder (HCC)   Relevant Medications   amLODipine (NORVASC) 5 MG tablet   olmesartan-hydrochlorothiazide (BENICAR HCT) 40-25 MG tablet   Insulin Pen Needle (B-D UF III MINI PEN NEEDLES) 31G X 5 MM MISC   glucose blood (ONE TOUCH ULTRA TEST) test strip   atorvastatin (LIPITOR) 40 MG tablet   dapagliflozin propanediol (FARXIGA) 5 MG TABS tablet   Essential hypertension    Well controlled, no changes to meds. Encouraged heart healthy diet such as the DASH diet and exercise as tolerated.       Relevant Medications   amLODipine (NORVASC) 5 MG tablet   olmesartan-hydrochlorothiazide (BENICAR HCT) 40-25 MG tablet   atorvastatin (LIPITOR) 40 MG tablet   Other Relevant Orders   Lipid panel   Hemoglobin A1c   Comprehensive metabolic panel   Microalbumin / creatinine urine ratio   Hyperlipidemia LDL goal <70    Tolerating statin, encouraged heart healthy diet, avoid trans fats, minimize simple carbs and saturated fats. Increase exercise as  tolerated      Relevant Medications   amLODipine (NORVASC) 5 MG tablet   olmesartan-hydrochlorothiazide (BENICAR HCT) 40-25 MG tablet   atorvastatin (LIPITOR) 40 MG tablet   Right hip pain - Primary    Suspect lumbar etiology-- check xray con't pain med       Relevant Orders   DG Lumbar Spine Complete   DG Hip Unilat W OR W/O Pelvis 2-3 Views Right   Uncontrolled type 2 diabetes mellitus with hyperglycemia (HCC)    hgba1c to be checked , minimize simple carbs. Increase exercise as tolerated. Continue current meds       Relevant Medications   olmesartan-hydrochlorothiazide (BENICAR HCT) 40-25 MG tablet   atorvastatin (LIPITOR) 40 MG tablet   dapagliflozin propanediol (FARXIGA) 5 MG TABS tablet   Other Relevant Orders   Lipid panel   Hemoglobin A1c   Comprehensive metabolic panel   Microalbumin / creatinine urine ratio    Other Visit Diagnoses    Hyperlipidemia associated with type 2 diabetes mellitus (HCC)       Relevant Medications   olmesartan-hydrochlorothiazide (BENICAR HCT) 40-25 MG tablet   atorvastatin (LIPITOR) 40 MG tablet   dapagliflozin propanediol (FARXIGA) 5 MG TABS tablet   Other Relevant Orders   Lipid panel   Hemoglobin A1c   Comprehensive metabolic panel   Microalbumin / creatinine urine ratio   Primary osteoarthritis of left knee       Relevant Medications   oxyCODONE-acetaminophen (ROXICET) 5-325 MG tablet   Hyperlipidemia, unspecified hyperlipidemia type       Relevant Medications   amLODipine (  NORVASC) 5 MG tablet   olmesartan-hydrochlorothiazide (BENICAR HCT) 40-25 MG tablet   atorvastatin (LIPITOR) 40 MG tablet      Follow-up: Return in about 6 months (around 08/20/2020), or if symptoms worsen or fail to improve, for annual exam, fasting.  Ann Held, DO

## 2020-02-21 NOTE — Assessment & Plan Note (Signed)
Well controlled, no changes to meds. Encouraged heart healthy diet such as the DASH diet and exercise as tolerated.  °

## 2020-02-21 NOTE — Assessment & Plan Note (Signed)
Tolerating statin, encouraged heart healthy diet, avoid trans fats, minimize simple carbs and saturated fats. Increase exercise as tolerated 

## 2020-02-21 NOTE — Patient Instructions (Signed)
Carbohydrate Counting for Diabetes Mellitus, Adult  Carbohydrate counting is a method of keeping track of how many carbohydrates you eat. Eating carbohydrates naturally increases the amount of sugar (glucose) in the blood. Counting how many carbohydrates you eat helps keep your blood glucose within normal limits, which helps you manage your diabetes (diabetes mellitus). It is important to know how many carbohydrates you can safely have in each meal. This is different for every person. A diet and nutrition specialist (registered dietitian) can help you make a meal plan and calculate how many carbohydrates you should have at each meal and snack. Carbohydrates are found in the following foods:  Grains, such as breads and cereals.  Dried beans and soy products.  Starchy vegetables, such as potatoes, peas, and corn.  Fruit and fruit juices.  Milk and yogurt.  Sweets and snack foods, such as cake, cookies, candy, chips, and soft drinks. How do I count carbohydrates? There are two ways to count carbohydrates in food. You can use either of the methods or a combination of both. Reading "Nutrition Facts" on packaged food The "Nutrition Facts" list is included on the labels of almost all packaged foods and beverages in the [place] It includes:  The serving size.  Information about nutrients in each serving, including the grams (g) of carbohydrate per serving. To use the "Nutrition Facts":  Decide how many servings you will have.  Multiply the number of servings by the number of carbohydrates per serving.  The resulting number is the total amount of carbohydrates that you will be having. Learning standard serving sizes of other foods When you eat carbohydrate foods that are not packaged or do not include "Nutrition Facts" on the label, you need to measure the servings in order to count the amount of carbohydrates:  Measure the foods that you will eat with a food scale or measuring cup, if  needed.  Decide how many standard-size servings you will eat.  Multiply the number of servings by 15. Most carbohydrate-rich foods have about 15 g of carbohydrates per serving. ? For example, if you eat 8 oz (170 g) of strawberries, you will have eaten 2 servings and 30 g of carbohydrates (2 servings x 15 g = 30 g).  For foods that have more than one food mixed, such as soups and casseroles, you must count the carbohydrates in each food that is included. The following list contains standard serving sizes of common carbohydrate-rich foods. Each of these servings has about 15 g of carbohydrates:   hamburger bun or  English muffin.   oz (15 mL) syrup.   oz (14 g) jelly.  1 slice of bread.  1 six-inch tortilla.  3 oz (85 g) cooked rice or pasta.  4 oz (113 g) cooked dried beans.  4 oz (113 g) starchy vegetable, such as peas, corn, or potatoes.  4 oz (113 g) hot cereal.  4 oz (113 g) mashed potatoes or  of a large baked potato.  4 oz (113 g) canned or frozen fruit.  4 oz (120 mL) fruit juice.  4-6 crackers.  6 chicken nuggets.  6 oz (170 g) unsweetened dry cereal.  6 oz (170 g) plain fat-free yogurt or yogurt sweetened with artificial sweeteners.  8 oz (240 mL) milk.  8 oz (170 g) fresh fruit or one small piece of fruit.  24 oz (680 g) popped popcorn. Example of carbohydrate counting Sample meal  3 oz (85 g) chicken breast.  6 oz (170 g)   brown rice.  4 oz (113 g) corn.  8 oz (240 mL) milk.  8 oz (170 g) strawberries with sugar-free whipped topping. Carbohydrate calculation 1. Identify the foods that contain carbohydrates: ? Rice. ? Corn. ? Milk. ? Strawberries. 2. Calculate how many servings you have of each food: ? 2 servings rice. ? 1 serving corn. ? 1 serving milk. ? 1 serving strawberries. 3. Multiply each number of servings by 15 g: ? 2 servings rice x 15 g = 30 g. ? 1 serving corn x 15 g = 15 g. ? 1 serving milk x 15 g = 15 g. ? 1  serving strawberries x 15 g = 15 g. 4. Add together all of the amounts to find the total grams of carbohydrates eaten: ? 30 g + 15 g + 15 g + 15 g = 75 g of carbohydrates total. Summary  Carbohydrate counting is a method of keeping track of how many carbohydrates you eat.  Eating carbohydrates naturally increases the amount of sugar (glucose) in the blood.  Counting how many carbohydrates you eat helps keep your blood glucose within normal limits, which helps you manage your diabetes.  A diet and nutrition specialist (registered dietitian) can help you make a meal plan and calculate how many carbohydrates you should have at each meal and snack. This information is not intended to replace advice given to you by your health care provider. Make sure you discuss any questions you have with your health care provider. Document Revised: 12/23/2016 Document Reviewed: 11/12/2015 Elsevier Patient Education  2020 [employer]  

## 2020-02-22 LAB — COMPREHENSIVE METABOLIC PANEL
AG Ratio: 1.4 (calc) (ref 1.0–2.5)
ALT: 12 U/L (ref 9–46)
AST: 14 U/L (ref 10–35)
Albumin: 4.2 g/dL (ref 3.6–5.1)
Alkaline phosphatase (APISO): 54 U/L (ref 35–144)
BUN: 16 mg/dL (ref 7–25)
CO2: 26 mmol/L (ref 20–32)
Calcium: 9.6 mg/dL (ref 8.6–10.3)
Chloride: 100 mmol/L (ref 98–110)
Creat: 0.89 mg/dL (ref 0.70–1.25)
Globulin: 3 g/dL (calc) (ref 1.9–3.7)
Glucose, Bld: 89 mg/dL (ref 65–99)
Potassium: 4.5 mmol/L (ref 3.5–5.3)
Sodium: 139 mmol/L (ref 135–146)
Total Bilirubin: 0.6 mg/dL (ref 0.2–1.2)
Total Protein: 7.2 g/dL (ref 6.1–8.1)

## 2020-02-22 LAB — HEMOGLOBIN A1C
Hgb A1c MFr Bld: 6.2 % of total Hgb — ABNORMAL HIGH (ref <5.7)
Mean Plasma Glucose: 131 (calc)
eAG (mmol/L): 7.3 (calc)

## 2020-02-22 LAB — MICROALBUMIN / CREATININE URINE RATIO
Creatinine, Urine: 79 mg/dL (ref 20–320)
Microalb Creat Ratio: 63 mcg/mg creat — ABNORMAL HIGH (ref <30)
Microalb, Ur: 5 mg/dL

## 2020-02-22 LAB — LIPID PANEL
Cholesterol: 151 mg/dL (ref <200)
HDL: 58 mg/dL (ref >=40)
LDL Cholesterol (Calc): 74 mg/dL (calc)
Non-HDL Cholesterol (Calc): 93 mg/dL (calc) (ref <130)
Total CHOL/HDL Ratio: 2.6 (calc) (ref <5.0)
Triglycerides: 109 mg/dL (ref <150)

## 2020-03-11 ENCOUNTER — Other Ambulatory Visit: Payer: Self-pay | Admitting: Family Medicine

## 2020-03-11 DIAGNOSIS — IMO0002 Reserved for concepts with insufficient information to code with codable children: Secondary | ICD-10-CM

## 2020-04-18 ENCOUNTER — Other Ambulatory Visit: Payer: Self-pay

## 2020-04-18 DIAGNOSIS — IMO0002 Reserved for concepts with insufficient information to code with codable children: Secondary | ICD-10-CM

## 2020-04-18 DIAGNOSIS — E1165 Type 2 diabetes mellitus with hyperglycemia: Secondary | ICD-10-CM

## 2020-04-18 MED ORDER — DAPAGLIFLOZIN PROPANEDIOL 5 MG PO TABS: 5.0000 mg | ORAL_TABLET | Freq: Every day | ORAL | 0 refills | Status: DC

## 2020-04-24 ENCOUNTER — Ambulatory Visit: Payer: BC Managed Care – PPO | Attending: Internal Medicine

## 2020-04-24 DIAGNOSIS — Z23 Encounter for immunization: Secondary | ICD-10-CM

## 2020-04-24 NOTE — Progress Notes (Signed)
   [facility]  Name:  Richard [name]    MRN: [id number] DOB: Apr 21, 1951  04/24/2020  Mr. Richard [name] was observed post Covid-19 immunization for 15 minutes without incident. He was provided with Vaccine Information Sheet and instruction to access the V-Safe system.   Mr. Richard [name] was instructed to call 911 with any severe reactions post vaccine: Marland Kitchen Difficulty breathing  . Swelling of face and throat  . A fast heartbeat  . A bad rash all over body  . Dizziness and weakness

## 2020-05-22 ENCOUNTER — Other Ambulatory Visit: Payer: Self-pay | Admitting: Family Medicine

## 2020-05-22 DIAGNOSIS — M1712 Unilateral primary osteoarthritis, left knee: Secondary | ICD-10-CM

## 2020-05-22 MED ORDER — OXYCODONE-ACETAMINOPHEN 5-325 MG PO TABS: 1.0000 | ORAL_TABLET | Freq: Three times a day (TID) | ORAL | 0 refills | Status: DC | PRN

## 2020-05-22 NOTE — Telephone Encounter (Signed)
Requesting: oxycodone 5-325mg  Contract: 04/05/2019 UDS: 04/05/2019 Last Visit: 02/21/2020 Next Visit: None scheduled Last Refill: 02/21/2020 #63 and 0RF Pt sig: 1 tab q8h prn  Please Advise

## 2020-08-06 ENCOUNTER — Other Ambulatory Visit: Payer: Self-pay | Admitting: Family Medicine

## 2020-08-06 DIAGNOSIS — E1151 Type 2 diabetes mellitus with diabetic peripheral angiopathy without gangrene: Secondary | ICD-10-CM

## 2020-08-06 DIAGNOSIS — IMO0002 Reserved for concepts with insufficient information to code with codable children: Secondary | ICD-10-CM

## 2020-08-08 ENCOUNTER — Other Ambulatory Visit: Payer: Self-pay | Admitting: Family Medicine

## 2020-08-08 DIAGNOSIS — IMO0002 Reserved for concepts with insufficient information to code with codable children: Secondary | ICD-10-CM

## 2020-08-08 DIAGNOSIS — E1165 Type 2 diabetes mellitus with hyperglycemia: Secondary | ICD-10-CM

## 2020-08-17 ENCOUNTER — Other Ambulatory Visit: Payer: Self-pay | Admitting: Family Medicine

## 2020-08-17 DIAGNOSIS — I10 Essential (primary) hypertension: Secondary | ICD-10-CM

## 2020-08-22 ENCOUNTER — Other Ambulatory Visit: Payer: Self-pay | Admitting: Family Medicine

## 2020-08-22 DIAGNOSIS — E1165 Type 2 diabetes mellitus with hyperglycemia: Secondary | ICD-10-CM

## 2020-08-22 DIAGNOSIS — IMO0002 Reserved for concepts with insufficient information to code with codable children: Secondary | ICD-10-CM

## 2020-08-22 DIAGNOSIS — M1712 Unilateral primary osteoarthritis, left knee: Secondary | ICD-10-CM

## 2020-08-22 MED ORDER — OXYCODONE-ACETAMINOPHEN 5-325 MG PO TABS
1.0000 | ORAL_TABLET | Freq: Three times a day (TID) | ORAL | 0 refills | Status: DC | PRN
Start: 2020-08-22 — End: 2020-11-20

## 2020-08-22 NOTE — Telephone Encounter (Signed)
Medication: oxyCODONE-acetaminophen (ROXICET) 5-325 MG tablet [[id number]]      Has the patient contacted their pharmacy?  (If no, request that the patient contact the pharmacy for the refill.) (If yes, when and what did the pharmacy advise?)     Preferred Pharmacy (with phone number or street name): [id number] - [place], [address]  [place], [place] [address]  Phone:  [phone] Fax:  [phone]  DEA #:  [id number]     Agent: Please be advised that RX refills may take up to 3 business days. We ask that you follow-up with your pharmacy.

## 2020-08-22 NOTE — Telephone Encounter (Signed)
Requesting: Roxicet Contract: 04/05/2019 UDS: 04/05/2019 Last OV: 02/21/2020 Next OV: N/A Last Refill: 05/22/2020, #63--0 RF Database:   Please advise

## 2020-09-14 ENCOUNTER — Other Ambulatory Visit: Payer: Self-pay | Admitting: Family Medicine

## 2020-09-14 DIAGNOSIS — I10 Essential (primary) hypertension: Secondary | ICD-10-CM

## 2020-09-25 ENCOUNTER — Encounter: Payer: Self-pay | Admitting: Family Medicine

## 2020-09-25 ENCOUNTER — Ambulatory Visit: Payer: BC Managed Care – PPO | Admitting: Family Medicine

## 2020-09-25 ENCOUNTER — Other Ambulatory Visit: Payer: Self-pay

## 2020-09-25 VITALS — BP 142/82 | HR 84 | Temp 98.5°F | Resp 18 | Ht 72.0 in | Wt 349.8 lb

## 2020-09-25 DIAGNOSIS — I10 Essential (primary) hypertension: Secondary | ICD-10-CM | POA: Diagnosis not present

## 2020-09-25 DIAGNOSIS — E1151 Type 2 diabetes mellitus with diabetic peripheral angiopathy without gangrene: Secondary | ICD-10-CM | POA: Diagnosis not present

## 2020-09-25 DIAGNOSIS — E1165 Type 2 diabetes mellitus with hyperglycemia: Secondary | ICD-10-CM

## 2020-09-25 DIAGNOSIS — L853 Xerosis cutis: Secondary | ICD-10-CM | POA: Diagnosis not present

## 2020-09-25 DIAGNOSIS — IMO0002 Reserved for concepts with insufficient information to code with codable children: Secondary | ICD-10-CM

## 2020-09-25 DIAGNOSIS — E785 Hyperlipidemia, unspecified: Secondary | ICD-10-CM

## 2020-09-25 DIAGNOSIS — E1169 Type 2 diabetes mellitus with other specified complication: Secondary | ICD-10-CM

## 2020-09-25 LAB — LIPID PANEL
Cholesterol: 133 mg/dL (ref 0–200)
HDL: 52.4 mg/dL (ref >39.00)
LDL Cholesterol: 62 mg/dL (ref 0–99)
NonHDL: 80.72
Total CHOL/HDL Ratio: 3
Triglycerides: 96 mg/dL (ref 0.0–149.0)
VLDL: 19.2 mg/dL (ref 0.0–40.0)

## 2020-09-25 LAB — MICROALBUMIN / CREATININE URINE RATIO
Creatinine,U: 193.7 mg/dL
Microalb Creat Ratio: 1.6 mg/g (ref 0.0–30.0)
Microalb, Ur: 3.1 mg/dL — ABNORMAL HIGH (ref 0.0–1.9)

## 2020-09-25 LAB — COMPREHENSIVE METABOLIC PANEL
ALT: 14 U/L (ref 0–53)
AST: 14 U/L (ref 0–37)
Albumin: 4.2 g/dL (ref 3.5–5.2)
Alkaline Phosphatase: 61 U/L (ref 39–117)
BUN: 20 mg/dL (ref 6–23)
CO2: 28 mEq/L (ref 19–32)
Calcium: 9.5 mg/dL (ref 8.4–10.5)
Chloride: 101 mEq/L (ref 96–112)
Creatinine, Ser: 1.05 mg/dL (ref 0.40–1.50)
GFR: 73.36 mL/min (ref >60.00)
Glucose, Bld: 90 mg/dL (ref 70–99)
Potassium: 4.7 mEq/L (ref 3.5–5.1)
Sodium: 139 mEq/L (ref 135–145)
Total Bilirubin: 0.5 mg/dL (ref 0.2–1.2)
Total Protein: 7.5 g/dL (ref 6.0–8.3)

## 2020-09-25 LAB — HEMOGLOBIN A1C: Hgb A1c MFr Bld: 6.4 % (ref 4.6–6.5)

## 2020-09-25 MED ORDER — ONETOUCH ULTRA VI STRP: ORAL_STRIP | 12 refills | Status: DC

## 2020-09-25 MED ORDER — OLMESARTAN MEDOXOMIL-HCTZ 40-25 MG PO TABS: 1.0000 | ORAL_TABLET | Freq: Every day | ORAL | 0 refills | Status: DC

## 2020-09-25 MED ORDER — RYBELSUS 3 MG PO TABS: 3.0000 mg | ORAL_TABLET | Freq: Every day | ORAL | 0 refills | Status: DC

## 2020-09-25 MED ORDER — DAPAGLIFLOZIN PROPANEDIOL 5 MG PO TABS: 5.0000 mg | ORAL_TABLET | Freq: Every day | ORAL | 0 refills | Status: DC

## 2020-09-25 MED ORDER — AMMONIUM LACTATE 12 % EX CREA: TOPICAL_CREAM | CUTANEOUS | 0 refills | Status: DC | PRN

## 2020-09-25 NOTE — Progress Notes (Signed)
Subjective:   By signing my name below, I, Shehryar Baig, attest that this documentation has been prepared under the direction and in the presence of Dr. Etter Sjogren- Lyndal Pulley. 09/25/2020   Patient ID: Richard [name], male    DOB: [birth year], 70 y.o.   MRN: [id number]  Chief Complaint  Patient presents with  . Medication Refill    F/u- has not been seen in 7 months Concerns/questions: pt would like explanation of the test that Podiatry ran on his feet.  He would like Omlestartan, test strips, and Farxiaga Rx's printed off.    HPI Patient is in today for a office visit. He reports having left wrist pain with twisting and inverting his wrist medially. He uses his left wrist when walking with his cane. He mentions writing with right hand. He often takes Tylenol but finds no relief.   He is still complaining of hip pain, he has completed imaging and was recently diagnosed with osteoarthritis. He is requesting for more options of treatment to relieve the pain. He would like to avoid any invasive procedures.    He questions if he could switch his Victoza medication because price changes. He has a history of diabetes and notes having dry skin on his lower legs. He has been using vitamin D cream to treat it, however, he request for something stronger. He denies any skin rashes, chest pain, tightness, or pressure, shortness of breath, fever, chills, or N/V/D at this time.   He notes his sugar levels have ranges from 106 to 118. He is planning to decrease this number with diet. Lab Results  Component Value Date   HGBA1C 6.2 (H) 02/21/2020    Past Medical History:  Diagnosis Date  . Allergy    seasonal  . Arthritis     left knee,  right hip  . Complication of anesthesia    half awake during last colonscopy  . Diabetes mellitus    type 2  . Hx of adenomatous colonic polyps    2010 6 mm adenoma 10/06/2016 2 5 mm descending polyps   . Hyperlipidemia   . Hypertension     Past Surgical  History:  Procedure Laterality Date  . COLONOSCOPY    . COLONOSCOPY WITH PROPOFOL N/A 10/06/2016   Procedure: COLONOSCOPY WITH PROPOFOL;  Surgeon: Gatha Mayer, MD;  Location: [facility];  Service: Endoscopy;  Laterality: N/A;    Family History  Problem Relation Age of Onset  . Diabetes Mother   . Diabetes Sister   . Hypertension Sister   . Stroke Sister   . Diabetes Brother   . Colon cancer Neg Hx   . Colon polyps Neg Hx   . Rectal cancer Neg Hx   . Stomach cancer Neg Hx   . Esophageal cancer Neg Hx     Social History   Socioeconomic History  . Marital status: Married    Spouse name: Not on file  . Number of children: Not on file  . Years of education: Not on file  . Highest education level: Not on file  Occupational History  . Not on file  Tobacco Use  . Smoking status: Never Smoker  . Smokeless tobacco: Never Used  Substance and Sexual Activity  . Alcohol use: Yes    Comment: occasionally   . Drug use: No  . Sexual activity: Not on file  Other Topics Concern  . Not on file  Social History Narrative  . Not on file   Social Determinants  of Health   Financial Resource Strain: Not on file  Food Insecurity: Not on file  Transportation Needs: Not on file  Physical Activity: Not on file  Stress: Not on file  Social Connections: Not on file  Intimate Partner Violence: Not on file    Outpatient Medications Prior to Visit  Medication Sig Dispense Refill  . amLODipine (NORVASC) 5 MG tablet Take 1 tablet (5 mg total) by mouth daily. 90 tablet 1  . atorvastatin (LIPITOR) 40 MG tablet Take 1 tablet (40 mg total) by mouth at bedtime. 90 tablet 1  . blood glucose meter kit and supplies KIT Dispense based on patient and insurance preference. Use up to four times daily as directed. (FOR ICD-9 250.00, 250.01). 1 each 0  . fenofibrate 160 MG tablet Take 1 tablet (160 mg total) by mouth daily. 90 tablet 1  . Insulin Pen Needle (B-D UF III MINI PEN NEEDLES) 31G X 5 MM  MISC USE AS DIRECTED 100 each 1  . liraglutide (VICTOZA) 18 MG/3ML SOPN Inject 1.8 mg into the skin daily. 9 mL 1  . metFORMIN (GLUCOPHAGE) 500 MG tablet TAKE 2 TABLETS (1,000 MG TOTAL) BY MOUTH 2 (TWO) TIMES DAILY WITH A MEAL. 360 tablet 1  . oxyCODONE-acetaminophen (ROXICET) 5-325 MG tablet Take 1 tablet by mouth every 8 (eight) hours as needed for severe pain. 63 tablet 0  . dapagliflozin propanediol (FARXIGA) 5 MG TABS tablet Take 1 tablet (5 mg total) by mouth daily. 90 tablet 0  . glucose blood (ONETOUCH ULTRA) test strip Check blood sugars once daily 100 strip 12  . olmesartan-hydrochlorothiazide (BENICAR HCT) 40-25 MG tablet Take 1 tablet by mouth daily. 90 tablet 0   No facility-administered medications prior to visit.    No Known Allergies  Review of Systems  Constitutional: Negative for chills, fever and malaise/fatigue.  HENT: Negative for congestion, ear pain, sinus pain and sore throat.   Eyes: Negative for pain.  Respiratory: Negative for cough, shortness of breath and wheezing.   Cardiovascular: Negative for chest pain and palpitations.  Gastrointestinal: Negative for abdominal pain, blood in stool, diarrhea, nausea and vomiting.  Genitourinary: Negative for dysuria, frequency and urgency.  Musculoskeletal: Positive for joint pain (right wrist pain).       (+)Hip pain  Skin: Negative for rash.       (+)Dry skin on lower extremities   Neurological: Negative for dizziness and headaches.       Objective:    Physical Exam Constitutional:      General: He is not in acute distress.    Appearance: Normal appearance. He is not ill-appearing.  HENT:     Head: Normocephalic and atraumatic.     Right Ear: External ear normal.     Left Ear: External ear normal.  Eyes:     Extraocular Movements: Extraocular movements intact.     Pupils: Pupils are equal, round, and reactive to light.  Cardiovascular:     Rate and Rhythm: Normal rate and regular rhythm.     Pulses:  Normal pulses.     Heart sounds: Normal heart sounds. No murmur heard.   Pulmonary:     Effort: Pulmonary effort is normal. No respiratory distress.     Breath sounds: Normal breath sounds. No wheezing, rhonchi or rales.  Musculoskeletal:     Left wrist: Tenderness present.  Skin:    General: Skin is warm and dry.  Neurological:     Mental Status: He is alert and oriented to  person, place, and time.  Psychiatric:        Behavior: Behavior normal.     BP (!) 142/82 (BP Location: Left Arm, Patient Position: Sitting, Cuff Size: Large)   Pulse 84   Temp 98.5 F (36.9 C) (Oral)   Resp 18   Ht 6' (1.829 m)   Wt (!) 349 lb 12.8 oz (158.7 kg)   SpO2 96%   BMI 47.44 kg/m  Wt Readings from Last 3 Encounters:  09/25/20 (!) 349 lb 12.8 oz (158.7 kg)  02/21/20 (!) 338 lb 12.8 oz (153.7 kg)  08/17/19 (!) 350 lb 12.8 oz (159.1 kg)    Diabetic Foot Exam - Simple   No data filed    Lab Results  Component Value Date   WBC 7.0 02/06/2013   HGB 15.4 02/06/2013   HCT 45.3 02/06/2013   PLT 234.0 02/06/2013   GLUCOSE 89 02/21/2020   CHOL 151 02/21/2020   TRIG 109 02/21/2020   HDL 58 02/21/2020   LDLDIRECT 133.0 12/05/2017   LDLCALC 74 02/21/2020   ALT 12 02/21/2020   AST 14 02/21/2020   NA 139 02/21/2020   K 4.5 02/21/2020   CL 100 02/21/2020   CREATININE 0.89 02/21/2020   BUN 16 02/21/2020   CO2 26 02/21/2020   TSH 0.75 01/27/2009   PSA 0.29 01/27/2009   HGBA1C 6.2 (H) 02/21/2020   MICROALBUR 5.0 02/21/2020    Lab Results  Component Value Date   TSH 0.75 01/27/2009   Lab Results  Component Value Date   WBC 7.0 02/06/2013   HGB 15.4 02/06/2013   HCT 45.3 02/06/2013   MCV 89.6 02/06/2013   PLT 234.0 02/06/2013   Lab Results  Component Value Date   NA 139 02/21/2020   K 4.5 02/21/2020   CO2 26 02/21/2020   GLUCOSE 89 02/21/2020   BUN 16 02/21/2020   CREATININE 0.89 02/21/2020   BILITOT 0.6 02/21/2020   ALKPHOS 54 08/17/2019   AST 14 02/21/2020   ALT 12  02/21/2020   PROT 7.2 02/21/2020   ALBUMIN 4.0 08/17/2019   CALCIUM 9.6 02/21/2020   GFR 100.68 08/17/2019   Lab Results  Component Value Date   CHOL 151 02/21/2020   Lab Results  Component Value Date   HDL 58 02/21/2020   Lab Results  Component Value Date   LDLCALC 74 02/21/2020   Lab Results  Component Value Date   TRIG 109 02/21/2020   Lab Results  Component Value Date   CHOLHDL 2.6 02/21/2020   Lab Results  Component Value Date   HGBA1C 6.2 (H) 02/21/2020       Assessment & Plan:   Problem List Items Addressed This Visit      Cardiovascular and Mediastinum   DM (diabetes mellitus) type II uncontrolled, periph vascular disorder ([place])   Relevant Medications   dapagliflozin propanediol (FARXIGA) 5 MG TABS tablet   olmesartan-hydrochlorothiazide (BENICAR HCT) 40-25 MG tablet   glucose blood (ONETOUCH ULTRA) test strip   Essential hypertension   Relevant Medications   olmesartan-hydrochlorothiazide (BENICAR HCT) 40-25 MG tablet      @[facility] @  Meds ordered this encounter  Medications  . dapagliflozin propanediol (FARXIGA) 5 MG TABS tablet    Sig: Take 1 tablet (5 mg total) by mouth daily.    Dispense:  90 tablet    Refill:  0  . olmesartan-hydrochlorothiazide (BENICAR HCT) 40-25 MG tablet    Sig: Take 1 tablet by mouth daily.    Dispense:  90  tablet    Refill:  0  . glucose blood (ONETOUCH ULTRA) test strip    Sig: Check blood sugars once daily    Dispense:  100 strip    Refill:  12    Dx: E11.9    I, Shehryar Baig, personally preformed the services described in this documentation.  All medical record entries made by the scribe were at my direction and in my presence.  I have reviewed the chart and discharge instructions (if applicable) and agree that the record reflects my personal performance and is accurate and complete. 09/25/20  I,Shehryar Baig,acting as a scribe for Ann Held, DO.,have documented all relevant documentation on the  behalf of Ann Held, DO,as directed by  Ann Held, DO while in the presence of [place], DO, have reviewed all documentation for this visit. The documentation on 09/25/20 for the exam, diagnosis, procedures, and orders are all accurate and complete.  Shehryar [employer]

## 2020-09-25 NOTE — Patient Instructions (Signed)

## 2020-09-30 MED ORDER — RYBELSUS 3 MG PO TABS: 3.0000 mg | ORAL_TABLET | Freq: Every day | ORAL | 0 refills | Status: AC

## 2020-09-30 NOTE — Addendum Note (Signed)
Addended by: Kem Boroughs D on: 09/30/2020 12:33 PM   Modules accepted: Orders

## 2020-10-12 ENCOUNTER — Other Ambulatory Visit: Payer: Self-pay | Admitting: Family Medicine

## 2020-10-12 DIAGNOSIS — IMO0002 Reserved for concepts with insufficient information to code with codable children: Secondary | ICD-10-CM

## 2020-10-12 DIAGNOSIS — E1151 Type 2 diabetes mellitus with diabetic peripheral angiopathy without gangrene: Secondary | ICD-10-CM

## 2020-10-15 ENCOUNTER — Telehealth: Payer: Self-pay | Admitting: Family Medicine

## 2020-10-15 NOTE — Telephone Encounter (Signed)
Pt, called stated that he stop taking the Rybelsus because he notice after taking it for several day he gets a pain in his head down to his neck it hurt when he chew so he stop taking it   Please Advice

## 2020-10-24 ENCOUNTER — Telehealth: Payer: Self-pay

## 2020-10-24 NOTE — Telephone Encounter (Signed)
A1c was good--- hold off add anything else- Recheck labs --- 3 months---- - lab only ok

## 2020-10-24 NOTE — Telephone Encounter (Signed)
Spoke with the patient. Pt states stopping Rybelsus and the pain in his head and neck went away. Pt would like to know what we need to do now. Please advise

## 2020-10-24 NOTE — Telephone Encounter (Signed)
Pt called stating that he made an appointment with the hand specialist (no referral in the system) and is wondering if they will need a referral from Dr. Etter Sjogren for insurance purposes? - JMA

## 2020-10-24 NOTE — Telephone Encounter (Signed)
Called patient. Advised him to contact his insurance company to find out if he needs a referral.

## 2020-10-27 ENCOUNTER — Other Ambulatory Visit: Payer: Self-pay | Admitting: Family Medicine

## 2020-10-27 DIAGNOSIS — E785 Hyperlipidemia, unspecified: Secondary | ICD-10-CM

## 2020-10-27 DIAGNOSIS — E1169 Type 2 diabetes mellitus with other specified complication: Secondary | ICD-10-CM

## 2020-10-27 DIAGNOSIS — E1165 Type 2 diabetes mellitus with hyperglycemia: Secondary | ICD-10-CM

## 2020-10-27 DIAGNOSIS — I10 Essential (primary) hypertension: Secondary | ICD-10-CM

## 2020-10-27 NOTE — Telephone Encounter (Signed)
Could you place orders that you would like done please?

## 2020-11-01 ENCOUNTER — Other Ambulatory Visit: Payer: Self-pay | Admitting: Family Medicine

## 2020-11-01 DIAGNOSIS — I10 Essential (primary) hypertension: Secondary | ICD-10-CM

## 2020-11-03 MED ORDER — OLMESARTAN MEDOXOMIL-HCTZ 40-25 MG PO TABS: 1.0000 | ORAL_TABLET | Freq: Every day | ORAL | 1 refills | Status: DC

## 2020-11-03 NOTE — Addendum Note (Signed)
Addended by: Kem Boroughs D on: 11/03/2020 02:59 PM   Modules accepted: Orders

## 2020-11-18 ENCOUNTER — Other Ambulatory Visit: Payer: Self-pay | Admitting: Family Medicine

## 2020-11-18 DIAGNOSIS — E1151 Type 2 diabetes mellitus with diabetic peripheral angiopathy without gangrene: Secondary | ICD-10-CM

## 2020-11-18 DIAGNOSIS — IMO0002 Reserved for concepts with insufficient information to code with codable children: Secondary | ICD-10-CM

## 2020-11-20 ENCOUNTER — Other Ambulatory Visit: Payer: Self-pay | Admitting: Family Medicine

## 2020-11-20 DIAGNOSIS — M1712 Unilateral primary osteoarthritis, left knee: Secondary | ICD-10-CM

## 2020-11-21 MED ORDER — OXYCODONE-ACETAMINOPHEN 5-325 MG PO TABS: 1.0000 | ORAL_TABLET | Freq: Three times a day (TID) | ORAL | 0 refills | Status: DC | PRN

## 2020-11-21 NOTE — Telephone Encounter (Signed)
Requesting: oxycodone 5-325mg  Contract: 09/25/2020 UDS: 04/05/2019 Last Visit 10/27/2020  Next Visit: None Last Refill: 08/22/2020 #63 and 0RF  Please Advise

## 2020-12-30 ENCOUNTER — Ambulatory Visit: Payer: BC Managed Care – PPO | Admitting: Family Medicine

## 2020-12-30 ENCOUNTER — Ambulatory Visit (INDEPENDENT_AMBULATORY_CARE_PROVIDER_SITE_OTHER)
Admission: RE | Admit: 2020-12-30 | Discharge: 2020-12-30 | Disposition: A | Discharge status: Home / Self Care | Payer: BC Managed Care – PPO | Source: Ambulatory Visit | Attending: Family Medicine | Admitting: Family Medicine

## 2020-12-30 ENCOUNTER — Encounter: Payer: Self-pay | Admitting: Family Medicine

## 2020-12-30 ENCOUNTER — Other Ambulatory Visit: Payer: Self-pay

## 2020-12-30 VITALS — BP 144/78 | HR 93 | Temp 98.3°F | Resp 20 | Ht 72.0 in | Wt 353.0 lb

## 2020-12-30 DIAGNOSIS — R059 Cough, unspecified: Secondary | ICD-10-CM

## 2020-12-30 DIAGNOSIS — U071 COVID-19: Secondary | ICD-10-CM | POA: Diagnosis not present

## 2020-12-30 DIAGNOSIS — E1151 Type 2 diabetes mellitus with diabetic peripheral angiopathy without gangrene: Secondary | ICD-10-CM

## 2020-12-30 DIAGNOSIS — IMO0002 Reserved for concepts with insufficient information to code with codable children: Secondary | ICD-10-CM

## 2020-12-30 DIAGNOSIS — E1165 Type 2 diabetes mellitus with hyperglycemia: Secondary | ICD-10-CM

## 2020-12-30 DIAGNOSIS — I10 Essential (primary) hypertension: Secondary | ICD-10-CM | POA: Diagnosis not present

## 2020-12-30 MED ORDER — PREDNISONE 10 MG PO TABS: ORAL_TABLET | ORAL | 0 refills | Status: DC

## 2020-12-30 MED ORDER — METHYLPREDNISOLONE ACETATE 80 MG/ML IJ SUSP
80.0000 mg | Freq: Once | INTRAMUSCULAR | Status: AC
  Administered 2020-12-30: 80 mg via INTRAMUSCULAR

## 2020-12-30 MED ORDER — PROMETHAZINE-DM 6.25-15 MG/5ML PO SYRP: 5.0000 mL | ORAL_SOLUTION | Freq: Four times a day (QID) | ORAL | 0 refills | Status: DC | PRN

## 2020-12-30 MED ORDER — DILTIAZEM HCL ER COATED BEADS 120 MG PO CP24: 120.0000 mg | ORAL_CAPSULE | Freq: Every day | ORAL | 1 refills | Status: DC

## 2020-12-30 MED ORDER — INSULIN ASPART 100 UNIT/ML IJ SOLN: INTRAMUSCULAR | 99 refills | Status: DC

## 2020-12-30 NOTE — Assessment & Plan Note (Signed)
Controlled Ss insulin due to pred taper

## 2020-12-30 NOTE — Progress Notes (Signed)
Subjective:   By signing my name below, I, Shehryar Baig, attest that this documentation has been prepared under the direction and in the presence of Dr. Roma Schanz, DO. 12/30/2020     Patient ID: Richard [name], male    DOB: [birth year], 70 y.o.   MRN: [id number]  Chief Complaint  Patient presents with   Oral Swelling    Pt states having a prior cough. Pt states having swollen lymph nodes and felt like he had to clear his throat repeatedly     HPI Patient is in today for a office visit. He complains of a swollen throat. He tested positive for Covid-19 on July 3rd, 2022 and was suffering from sore throat. His wife also tested positive for Covid-19 during that time. He recently tested negative for Covid-19 and his symptoms improved 1 week ago. Recently his sore throat symptoms returned and now he has cough and congestion. He also feels like there is something catching in his throat. He also reports that he has episodes of coughing so hard that his right nostril bleeds and also cough wakes him up at night. He prefers to take cough medication to help him also sleep throughout the night. He reports having 2 episodes of feet swelling while on 5 mg amlodipine daily PO. To manage his symptoms he rubbed his feet with alcohol. He prefers to change his medication to stop further swelling of his feet.  He reports having severe headaches  due to taking rybelsus to manage his diabetes.  He is interested in getting the flu shot during the autumn season.  Past Medical History:  Diagnosis Date   Allergy    seasonal   Arthritis     left knee,  right hip   Complication of anesthesia    half awake during last colonscopy   Diabetes mellitus    type 2   Hx of adenomatous colonic polyps    2010 6 mm adenoma 10/06/2016 2 5 mm descending polyps    Hyperlipidemia    Hypertension     Past Surgical History:  Procedure Laterality Date   COLONOSCOPY     COLONOSCOPY WITH PROPOFOL N/A 10/06/2016    Procedure: COLONOSCOPY WITH PROPOFOL;  Surgeon: Gatha Mayer, MD;  Location: [facility];  Service: Endoscopy;  Laterality: N/A;    Family History  Problem Relation Age of Onset   Diabetes Mother    Diabetes Sister    Hypertension Sister    Stroke Sister    Diabetes Brother    Colon cancer Neg Hx    Colon polyps Neg Hx    Rectal cancer Neg Hx    Stomach cancer Neg Hx    Esophageal cancer Neg Hx     Social History   Socioeconomic History   Marital status: Married    Spouse name: Not on file   Number of children: Not on file   Years of education: Not on file   Highest education level: Not on file  Occupational History   Not on file  Tobacco Use   Smoking status: Never   Smokeless tobacco: Never  Substance and Sexual Activity   Alcohol use: Yes    Comment: occasionally    Drug use: No   Sexual activity: Not on file  Other Topics Concern   Not on file  Social History Narrative   Not on file   Social Determinants of Health   Financial Resource Strain: Not on file  Food Insecurity: Not on file  Transportation Needs: Not on file  Physical Activity: Not on file  Stress: Not on file  Social Connections: Not on file  Intimate Partner Violence: Not on file    Outpatient Medications Prior to Visit  Medication Sig Dispense Refill   ammonium lactate (LAC-HYDRIN) 12 % cream Apply topically as needed for dry skin. 385 g 0   atorvastatin (LIPITOR) 40 MG tablet Take 1 tablet (40 mg total) by mouth at bedtime. 90 tablet 1   blood glucose meter kit and supplies KIT Dispense based on patient and insurance preference. Use up to four times daily as directed. (FOR ICD-9 250.00, 250.01). 1 each 0   dapagliflozin propanediol (FARXIGA) 5 MG TABS tablet Take 1 tablet (5 mg total) by mouth daily. 90 tablet 0   fenofibrate 160 MG tablet Take 1 tablet (160 mg total) by mouth daily. 90 tablet 1   glucose blood (ONETOUCH ULTRA) test strip TEST BLOOD SUGAR ONCE DAILY 100 strip 1   Insulin  Pen Needle (B-D UF III MINI PEN NEEDLES) 31G X 5 MM MISC USE AS DIRECTED 100 each 1   metFORMIN (GLUCOPHAGE) 500 MG tablet TAKE 2 TABLETS (1,000 MG TOTAL) BY MOUTH 2 (TWO) TIMES DAILY WITH A MEAL. 360 tablet 1   olmesartan-hydrochlorothiazide (BENICAR HCT) 40-25 MG tablet Take 1 tablet by mouth daily. 90 tablet 1   oxyCODONE-acetaminophen (ROXICET) 5-325 MG tablet Take 1 tablet by mouth every 8 (eight) hours as needed for severe pain. 63 tablet 0   VICTOZA 18 MG/3ML SOPN INJECT 1.8 MG UNDER THE SKIN ONCE DAILY 9 mL 1   amLODipine (NORVASC) 5 MG tablet Take 1 tablet (5 mg total) by mouth daily. 90 tablet 1   No facility-administered medications prior to visit.    No Active Allergies  Review of Systems  HENT:  Positive for congestion, nosebleeds (Occasional right nostril bleed due to episodes of coughing) and sore throat.   Respiratory:  Positive for cough.   Cardiovascular:  Positive for leg swelling (occasional).      Objective:    Physical Exam Constitutional:      General: He is not in acute distress.    Appearance: Normal appearance. He is not ill-appearing.  HENT:     Head: Normocephalic and atraumatic.     Right Ear: External ear normal.     Left Ear: External ear normal.  Eyes:     Extraocular Movements: Extraocular movements intact.     Pupils: Pupils are equal, round, and reactive to light.  Cardiovascular:     Rate and Rhythm: Normal rate and regular rhythm.     Pulses: Normal pulses.     Heart sounds: Normal heart sounds. No murmur heard.   No gallop.  Pulmonary:     Effort: Pulmonary effort is normal. No respiratory distress.     Breath sounds: Normal breath sounds. No wheezing, rhonchi or rales.  Musculoskeletal:     Right lower leg: 1+ Pitting Edema present.     Left lower leg: 1+ Pitting Edema present.  Skin:    General: Skin is warm and dry.  Neurological:     Mental Status: He is alert and oriented to person, place, and time.  Psychiatric:         Behavior: Behavior normal.    BP (!) 144/78 (BP Location: Right Arm, Patient Position: Sitting, Cuff Size: Large)   Pulse 93   Temp 98.3 F (36.8 C) (Oral)   Resp 20   Ht 6' (1.829 m)  Wt (!) 353 lb (160.1 kg)   SpO2 96%   BMI 47.88 kg/m  Wt Readings from Last 3 Encounters:  12/30/20 (!) 353 lb (160.1 kg)  09/25/20 (!) 349 lb 12.8 oz (158.7 kg)  02/21/20 (!) 338 lb 12.8 oz (153.7 kg)    Diabetic Foot Exam - Simple   No data filed    Lab Results  Component Value Date   WBC 7.0 02/06/2013   HGB 15.4 02/06/2013   HCT 45.3 02/06/2013   PLT 234.0 02/06/2013   GLUCOSE 90 09/25/2020   CHOL 133 09/25/2020   TRIG 96.0 09/25/2020   HDL 52.40 09/25/2020   LDLDIRECT 133.0 12/05/2017   LDLCALC 62 09/25/2020   ALT 14 09/25/2020   AST 14 09/25/2020   NA 139 09/25/2020   K 4.7 09/25/2020   CL 101 09/25/2020   CREATININE 1.05 09/25/2020   BUN 20 09/25/2020   CO2 28 09/25/2020   TSH 0.75 01/27/2009   PSA 0.29 01/27/2009   HGBA1C 6.4 09/25/2020   MICROALBUR 3.1 (H) 09/25/2020    Lab Results  Component Value Date   TSH 0.75 01/27/2009   Lab Results  Component Value Date   WBC 7.0 02/06/2013   HGB 15.4 02/06/2013   HCT 45.3 02/06/2013   MCV 89.6 02/06/2013   PLT 234.0 02/06/2013   Lab Results  Component Value Date   NA 139 09/25/2020   K 4.7 09/25/2020   CO2 28 09/25/2020   GLUCOSE 90 09/25/2020   BUN 20 09/25/2020   CREATININE 1.05 09/25/2020   BILITOT 0.5 09/25/2020   ALKPHOS 61 09/25/2020   AST 14 09/25/2020   ALT 14 09/25/2020   PROT 7.5 09/25/2020   ALBUMIN 4.2 09/25/2020   CALCIUM 9.5 09/25/2020   GFR 73.36 09/25/2020   Lab Results  Component Value Date   CHOL 133 09/25/2020   Lab Results  Component Value Date   HDL 52.40 09/25/2020   Lab Results  Component Value Date   LDLCALC 62 09/25/2020   Lab Results  Component Value Date   TRIG 96.0 09/25/2020   Lab Results  Component Value Date   CHOLHDL 3 09/25/2020   Lab Results  Component  Value Date   HGBA1C 6.4 09/25/2020       Assessment & Plan:   Problem List Items Addressed This Visit       Unprioritized   Cough - Primary    pred taper Cough med per orders cxr today F/u prn       Relevant Medications   diltiazem (CARTIA XT) 120 MG 24 hr capsule   promethazine-dextromethorphan (PROMETHAZINE-DM) 6.25-15 MG/5ML syrup   predniSONE (DELTASONE) 10 MG tablet   Other Relevant Orders   DG Chest 2 View (Completed)   DM (diabetes mellitus) type II uncontrolled, periph vascular disorder ([facility])    Ss insulin due to pred taper for cough       Relevant Medications   diltiazem (CARTIA XT) 120 MG 24 hr capsule   insulin aspart (NOVOLOG) 100 UNIT/ML injection   Primary hypertension   Relevant Medications   diltiazem (CARTIA XT) 120 MG 24 hr capsule   Uncontrolled type 2 diabetes mellitus with hyperglycemia ([facility])    Controlled Ss insulin due to pred taper       Relevant Medications   insulin aspart (NOVOLOG) 100 UNIT/ML injection   Other Visit Diagnoses     COVID-19       Relevant Medications   predniSONE (DELTASONE) 10 MG tablet  Other Relevant Orders   DG Chest 2 View (Completed)        Meds ordered this encounter  Medications   diltiazem (CARTIA XT) 120 MG 24 hr capsule    Sig: Take 1 capsule (120 mg total) by mouth daily.    Dispense:  30 capsule    Refill:  1   promethazine-dextromethorphan (PROMETHAZINE-DM) 6.25-15 MG/5ML syrup    Sig: Take 5 mLs by mouth 4 (four) times daily as needed.    Dispense:  118 mL    Refill:  0   predniSONE (DELTASONE) 10 MG tablet    Sig: TAKE 3 TABLETS PO QD FOR 3 DAYS THEN TAKE 2 TABLETS PO QD FOR 3 DAYS THEN TAKE 1 TABLET PO QD FOR 3 DAYS THEN TAKE 1/2 TAB PO QD FOR 3 DAYS    Dispense:  20 tablet    Refill:  0   insulin aspart (NOVOLOG) 100 UNIT/ML injection    Sig: Sliding scale as directed --- no more than qid and no more than 10 u each time    Dispense:  10 mL    Refill:  PRN   methylPREDNISolone  acetate (DEPO-MEDROL) injection 80 mg    I, Dr. Roma Schanz, DO, personally preformed the services described in this documentation.  All medical record entries made by the scribe were at my direction and in my presence.  I have reviewed the chart and discharge instructions (if applicable) and agree that the record reflects my personal performance and is accurate and complete. 12/30/2020   I,Shehryar Baig,acting as a Education administrator for [employer], DO.,have documented all relevant documentation on the behalf of Ann Held, DO,as directed by  Ann Held, DO while in the presence of Ann Held, DO.   Ann Held, DO

## 2020-12-30 NOTE — Assessment & Plan Note (Signed)
Ss insulin due to pred taper for cough

## 2020-12-30 NOTE — Assessment & Plan Note (Signed)
norvasc calling edema Stop norvasc and start cartia  F/u 2-3 weeks bp check

## 2020-12-30 NOTE — Assessment & Plan Note (Signed)
pred taper Cough med per orders cxr today F/u prn

## 2020-12-30 NOTE — Patient Instructions (Signed)
Check blood sugars 4x a day -- fasting and 2 h after meals    Sliding scale  200-250  2 u  251-300  4 u  301-350   6 u  351-400  8 u  >400 10 u and call dr

## 2020-12-31 ENCOUNTER — Encounter: Payer: Self-pay | Admitting: Family Medicine

## 2020-12-31 DIAGNOSIS — E1151 Type 2 diabetes mellitus with diabetic peripheral angiopathy without gangrene: Secondary | ICD-10-CM

## 2020-12-31 DIAGNOSIS — IMO0002 Reserved for concepts with insufficient information to code with codable children: Secondary | ICD-10-CM

## 2021-01-01 MED ORDER — "INSULIN SYRINGE-NEEDLE U-100 28G X 1/2"" 1 ML MISC": 2 refills | Status: DC

## 2021-01-26 ENCOUNTER — Other Ambulatory Visit: Payer: Self-pay | Admitting: Family Medicine

## 2021-01-26 DIAGNOSIS — IMO0002 Reserved for concepts with insufficient information to code with codable children: Secondary | ICD-10-CM

## 2021-01-26 DIAGNOSIS — E1151 Type 2 diabetes mellitus with diabetic peripheral angiopathy without gangrene: Secondary | ICD-10-CM

## 2021-01-30 ENCOUNTER — Other Ambulatory Visit: Payer: Self-pay | Admitting: Family Medicine

## 2021-01-30 DIAGNOSIS — IMO0002 Reserved for concepts with insufficient information to code with codable children: Secondary | ICD-10-CM

## 2021-01-30 DIAGNOSIS — E1165 Type 2 diabetes mellitus with hyperglycemia: Secondary | ICD-10-CM

## 2021-02-18 ENCOUNTER — Other Ambulatory Visit: Payer: Self-pay | Admitting: Family Medicine

## 2021-02-18 DIAGNOSIS — R059 Cough, unspecified: Secondary | ICD-10-CM

## 2021-02-19 ENCOUNTER — Other Ambulatory Visit: Payer: Self-pay | Admitting: Family Medicine

## 2021-02-19 DIAGNOSIS — M1712 Unilateral primary osteoarthritis, left knee: Secondary | ICD-10-CM

## 2021-02-19 MED ORDER — OXYCODONE-ACETAMINOPHEN 5-325 MG PO TABS: 1.0000 | ORAL_TABLET | Freq: Three times a day (TID) | ORAL | 0 refills | Status: DC | PRN

## 2021-02-19 NOTE — Telephone Encounter (Signed)
Requesting: oxycodone 5-'325mg'$  Contract: 09/25/2020 UDS: 04/05/2019 Last Visit: 12/30/2020 Next Visit: None Last Refill: 11/21/2020 #63 and 0RF  Please Advise

## 2021-03-27 ENCOUNTER — Other Ambulatory Visit: Payer: Self-pay

## 2021-03-27 ENCOUNTER — Ambulatory Visit (INDEPENDENT_AMBULATORY_CARE_PROVIDER_SITE_OTHER): Payer: BC Managed Care – PPO

## 2021-03-27 DIAGNOSIS — Z23 Encounter for immunization: Secondary | ICD-10-CM | POA: Diagnosis not present

## 2021-03-27 NOTE — Progress Notes (Signed)
[name] is a 70 y.o. male presents to the office today for HD flu shot, per physician's orders. Original order: 03/27/21- Dr Etter Sjogren HD flu shot,  IM (route) was administered L Delto[facility] (location) today. Patient tolerated injection. Patient due for follow up labs/provider appt:  Given VIS and Consent form.   Richard [name], Richard [name]

## 2021-04-06 ENCOUNTER — Encounter: Payer: Self-pay | Admitting: Family Medicine

## 2021-04-06 ENCOUNTER — Other Ambulatory Visit: Payer: Self-pay | Admitting: Family Medicine

## 2021-04-06 DIAGNOSIS — R059 Cough, unspecified: Secondary | ICD-10-CM

## 2021-04-06 MED ORDER — DILTIAZEM HCL ER COATED BEADS 120 MG PO CP24: 120.0000 mg | ORAL_CAPSULE | Freq: Every day | ORAL | 0 refills | Status: DC

## 2021-04-09 ENCOUNTER — Ambulatory Visit: Payer: BC Managed Care – PPO | Attending: Internal Medicine

## 2021-04-09 DIAGNOSIS — Z23 Encounter for immunization: Secondary | ICD-10-CM

## 2021-04-09 NOTE — Progress Notes (Signed)
   Covid-19 Vaccination Clinic  Name:  Richard [name]    MRN: [id number] DOB: [birth year]  04/09/2021  Mr. Hair was observed post Covid-19 immunization for 15 minutes without incident. He was provided with Vaccine Information Sheet and instruction to access the V-Safe system.   [name] was instructed to call 911 with any severe reactions post vaccine: Difficulty breathing  Swelling of face and throat  A fast heartbeat  A bad rash all over body  Dizziness and weakness   Immunizations Administered     Name Date Dose VIS Date Route   Pfizer Covid-19 Vaccine Bivalent Booster 04/09/2021 10:39 AM 0.3 mL 02/11/2021 Intramuscular   Manufacturer: [place]   Lot: [id number]   [place]: [phone]

## 2021-05-03 ENCOUNTER — Other Ambulatory Visit: Payer: Self-pay | Admitting: Family Medicine

## 2021-05-03 DIAGNOSIS — R059 Cough, unspecified: Secondary | ICD-10-CM

## 2021-05-04 ENCOUNTER — Other Ambulatory Visit (HOSPITAL_BASED_OUTPATIENT_CLINIC_OR_DEPARTMENT_OTHER): Payer: Self-pay

## 2021-05-04 MED ORDER — PFIZER COVID-19 VAC BIVALENT 30 MCG/0.3ML IM SUSP
INTRAMUSCULAR | 0 refills | Status: DC
  Filled 2021-05-04: qty 0.3, 1d supply, fill #0

## 2021-05-18 ENCOUNTER — Other Ambulatory Visit: Payer: Self-pay | Admitting: Family Medicine

## 2021-05-18 ENCOUNTER — Other Ambulatory Visit: Payer: Self-pay | Admitting: *Deleted

## 2021-05-18 DIAGNOSIS — M1712 Unilateral primary osteoarthritis, left knee: Secondary | ICD-10-CM

## 2021-05-18 DIAGNOSIS — E119 Type 2 diabetes mellitus without complications: Secondary | ICD-10-CM

## 2021-05-18 MED ORDER — ONETOUCH ULTRA VI STRP: ORAL_STRIP | 1 refills | Status: DC

## 2021-05-18 MED ORDER — OXYCODONE-ACETAMINOPHEN 5-325 MG PO TABS: 1.0000 | ORAL_TABLET | Freq: Three times a day (TID) | ORAL | 0 refills | Status: DC | PRN

## 2021-05-18 MED ORDER — ONETOUCH ULTRA 2 W/DEVICE KIT: PACK | 0 refills | Status: DC

## 2021-05-18 MED ORDER — BLOOD GLUCOSE MONITOR KIT: PACK | 0 refills | Status: DC

## 2021-05-18 MED ORDER — ONETOUCH DELICA LANCETS 33G MISC: 1 refills | Status: DC

## 2021-05-18 NOTE — Telephone Encounter (Signed)
Requesting: oxycodone/apap Contract: 09/26/2018 UDS:04/05/19 Last Visit: 12/30/20 Next Visit: 05/28/21 Last Refill: 02/16/21  Will do uds and contract at next visit.  Please Advise

## 2021-05-25 ENCOUNTER — Other Ambulatory Visit: Payer: Self-pay | Admitting: Family Medicine

## 2021-05-25 DIAGNOSIS — E785 Hyperlipidemia, unspecified: Secondary | ICD-10-CM

## 2021-05-25 DIAGNOSIS — E1151 Type 2 diabetes mellitus with diabetic peripheral angiopathy without gangrene: Secondary | ICD-10-CM

## 2021-05-28 ENCOUNTER — Ambulatory Visit: Payer: BC Managed Care – PPO | Admitting: Family Medicine

## 2021-06-01 ENCOUNTER — Other Ambulatory Visit: Payer: Self-pay | Admitting: Family Medicine

## 2021-06-01 DIAGNOSIS — R059 Cough, unspecified: Secondary | ICD-10-CM

## 2021-06-02 ENCOUNTER — Encounter: Payer: Self-pay | Admitting: Family Medicine

## 2021-06-02 ENCOUNTER — Ambulatory Visit: Payer: BC Managed Care – PPO | Admitting: Family Medicine

## 2021-06-02 VITALS — BP 142/72 | HR 95 | Temp 97.7°F | Resp 20 | Ht 72.0 in | Wt 355.4 lb

## 2021-06-02 DIAGNOSIS — R059 Cough, unspecified: Secondary | ICD-10-CM

## 2021-06-02 DIAGNOSIS — E119 Type 2 diabetes mellitus without complications: Secondary | ICD-10-CM | POA: Diagnosis not present

## 2021-06-02 DIAGNOSIS — Z79899 Other long term (current) drug therapy: Secondary | ICD-10-CM

## 2021-06-02 DIAGNOSIS — I10 Essential (primary) hypertension: Secondary | ICD-10-CM | POA: Diagnosis not present

## 2021-06-02 DIAGNOSIS — M25551 Pain in right hip: Secondary | ICD-10-CM

## 2021-06-02 DIAGNOSIS — E1165 Type 2 diabetes mellitus with hyperglycemia: Secondary | ICD-10-CM | POA: Insufficient documentation

## 2021-06-02 DIAGNOSIS — E118 Type 2 diabetes mellitus with unspecified complications: Secondary | ICD-10-CM

## 2021-06-02 DIAGNOSIS — E785 Hyperlipidemia, unspecified: Secondary | ICD-10-CM

## 2021-06-02 DIAGNOSIS — Z23 Encounter for immunization: Secondary | ICD-10-CM | POA: Diagnosis not present

## 2021-06-02 LAB — COMPREHENSIVE METABOLIC PANEL
ALT: 13 U/L (ref 0–53)
AST: 15 U/L (ref 0–37)
Albumin: 4.3 g/dL (ref 3.5–5.2)
Alkaline Phosphatase: 61 U/L (ref 39–117)
BUN: 17 mg/dL (ref 6–23)
CO2: 27 mEq/L (ref 19–32)
Calcium: 9.8 mg/dL (ref 8.4–10.5)
Chloride: 98 mEq/L (ref 96–112)
Creatinine, Ser: 1.02 mg/dL (ref 0.40–1.50)
GFR: 75.59 mL/min (ref >60.00)
Glucose, Bld: 93 mg/dL (ref 70–99)
Potassium: 4.3 mEq/L (ref 3.5–5.1)
Sodium: 136 mEq/L (ref 135–145)
Total Bilirubin: 0.6 mg/dL (ref 0.2–1.2)
Total Protein: 7.5 g/dL (ref 6.0–8.3)

## 2021-06-02 LAB — LIPID PANEL
Cholesterol: 165 mg/dL (ref 0–200)
HDL: 55 mg/dL (ref >39.00)
LDL Cholesterol: 82 mg/dL (ref 0–99)
NonHDL: 109.75
Total CHOL/HDL Ratio: 3
Triglycerides: 139 mg/dL (ref 0.0–149.0)
VLDL: 27.8 mg/dL (ref 0.0–40.0)

## 2021-06-02 LAB — CBC WITH DIFFERENTIAL/PLATELET
Basophils Absolute: 0 10*3/uL (ref 0.0–0.1)
Basophils Relative: 0.3 % (ref 0.0–3.0)
Eosinophils Absolute: 0.1 10*3/uL (ref 0.0–0.7)
Eosinophils Relative: 1.2 % (ref 0.0–5.0)
HCT: 44.3 % (ref 39.0–52.0)
Hemoglobin: 14.6 g/dL (ref 13.0–17.0)
Lymphocytes Relative: 16.9 % (ref 12.0–46.0)
Lymphs Abs: 1.3 10*3/uL (ref 0.7–4.0)
MCHC: 32.9 g/dL (ref 30.0–36.0)
MCV: 95.3 fl (ref 78.0–100.0)
Monocytes Absolute: 0.7 10*3/uL (ref 0.1–1.0)
Monocytes Relative: 9.5 % (ref 3.0–12.0)
Neutro Abs: 5.4 10*3/uL (ref 1.4–7.7)
Neutrophils Relative %: 72.1 % (ref 43.0–77.0)
Platelets: 294 10*3/uL (ref 150.0–400.0)
RBC: 4.65 Mil/uL (ref 4.22–5.81)
RDW: 13.5 % (ref 11.5–15.5)
WBC: 7.5 10*3/uL (ref 4.0–10.5)

## 2021-06-02 LAB — HEMOGLOBIN A1C: Hgb A1c MFr Bld: 6.5 % (ref 4.6–6.5)

## 2021-06-02 MED ORDER — DAPAGLIFLOZIN PROPANEDIOL 5 MG PO TABS: 5.0000 mg | ORAL_TABLET | Freq: Every day | ORAL | 1 refills | Status: DC

## 2021-06-02 MED ORDER — BD PEN NEEDLE MINI U/F 31G X 5 MM MISC
1 refills | Status: DC
Start: 2021-06-02 — End: 2022-02-25

## 2021-06-02 MED ORDER — DILTIAZEM HCL ER COATED BEADS 120 MG PO CP24
120.0000 mg | ORAL_CAPSULE | Freq: Every day | ORAL | 0 refills | Status: DC
Start: 2021-06-02 — End: 2021-10-28

## 2021-06-02 MED ORDER — OLMESARTAN MEDOXOMIL-HCTZ 40-25 MG PO TABS: 1.0000 | ORAL_TABLET | Freq: Every day | ORAL | 1 refills | Status: DC

## 2021-06-02 NOTE — Patient Instructions (Signed)

## 2021-06-02 NOTE — Assessment & Plan Note (Signed)
Well controlled, no changes to meds. Encouraged heart healthy diet such as the DASH diet and exercise as tolerated.  °

## 2021-06-02 NOTE — Progress Notes (Signed)
Subjective:   By signing my name below, I, Shehryar Baig, attest that this documentation has been prepared under the direction and in the presence of Dr. Roma Schanz, DO. 06/02/2021    Patient ID: Richard [name], male    DOB: 05-Feb-1951, 70 y.o.   MRN: [id number]  Chief Complaint  Patient presents with   Hypertension   Diabetes   Follow-up    Hypertension Pertinent negatives include no blurred vision, chest pain, headaches, malaise/fatigue, palpitations or shortness of breath.  Diabetes Pertinent negatives for hypoglycemia include no headaches. Pertinent negatives for diabetes include no blurred vision and no chest pain.  Patient is in today for a office visit.  He complains of sporadic numbness in the fifth digit of his right hand. He has no difference in the frequency of dropping objects. He notes that he sleeps with his arm on an arm rest while sitting on the couch.  He is requesting a refill on needles, 40-25 mg benicar-HCT daily PO, 120 mg diltiazem daily PO, and farxiga.  He reports having low energy throughout the day. He reports being forgetful more frequently. He finds he takes more time between thinking of what he wants to say. He reports his wife has not noticed a difference.  He reports his sugars run around 120 in the morning and around 90 in the afternoon. He has not taken insulin due to not needing it. He reports not receiving needles at his pharmacy with the insulin.  He is due for vision care and is planning on making an appointment.  He is UTD on flu vaccine this year. He is eligible for the pneumonia vaccine and is interested in receiving it during this visit. He has received 5 Covid-19 vaccines including the bivalent Covid-19 vaccine.    Past Medical History:  Diagnosis Date   Allergy    seasonal   Arthritis     left knee,  right hip   Complication of anesthesia    half awake during last colonscopy   Diabetes mellitus    type 2   Hx of adenomatous  colonic polyps    2010 6 mm adenoma 10/06/2016 2 5 mm descending polyps    Hyperlipidemia    Hypertension     Past Surgical History:  Procedure Laterality Date   COLONOSCOPY     COLONOSCOPY WITH PROPOFOL N/A 10/06/2016   Procedure: COLONOSCOPY WITH PROPOFOL;  Surgeon: Gatha Mayer, MD;  Location: [facility];  Service: Endoscopy;  Laterality: N/A;    Family History  Problem Relation Age of Onset   Diabetes Mother    Diabetes Sister    Hypertension Sister    Stroke Sister    Diabetes Brother    Colon cancer Neg Hx    Colon polyps Neg Hx    Rectal cancer Neg Hx    Stomach cancer Neg Hx    Esophageal cancer Neg Hx     Social History   Socioeconomic History   Marital status: Married    Spouse name: Not on file   Number of children: Not on file   Years of education: Not on file   Highest education level: Not on file  Occupational History   Not on file  Tobacco Use   Smoking status: Never   Smokeless tobacco: Never  Substance and Sexual Activity   Alcohol use: Yes    Comment: occasionally    Drug use: No   Sexual activity: Not on file  Other Topics Concern   Not  on file  Social History Narrative   Not on file   Social Determinants of Health   Financial Resource Strain: Not on file  Food Insecurity: Not on file  Transportation Needs: Not on file  Physical Activity: Not on file  Stress: Not on file  Social Connections: Not on file  Intimate Partner Violence: Not on file    Outpatient Medications Prior to Visit  Medication Sig Dispense Refill   ammonium lactate (LAC-HYDRIN) 12 % cream Apply topically as needed for dry skin. 385 g 0   atorvastatin (LIPITOR) 40 MG tablet TAKE 1 TABLET BY MOUTH AT BEDTIME. 90 tablet 1   blood glucose meter kit and supplies KIT Dispense based on patient and insurance preference. Use up to four times daily as directed. (FOR ICD-9 250.00, 250.01). 1 each 0   Blood Glucose Monitoring Suppl (ONE TOUCH ULTRA 2) w/Device KIT Use to  check blood sugar once a day.  Dx code E11.65 1 kit 0   fenofibrate 160 MG tablet Take 1 tablet (160 mg total) by mouth daily. 90 tablet 1   glucose blood (ONETOUCH ULTRA) test strip Use to check blood sugar once a day.  Dx code E11.65 100 each 1   insulin aspart (NOVOLOG) 100 UNIT/ML injection Sliding scale as directed --- no more than qid and no more than 10 u each time 10 mL PRN   Insulin Syringe-Needle U-100 28G X 1/2" 1 ML MISC Use to inject Novolog 100 each 2   metFORMIN (GLUCOPHAGE) 500 MG tablet TAKE 2 TABLETS (1,000 MG TOTAL) BY MOUTH 2 (TWO) TIMES DAILY WITH A MEAL. 360 tablet 1   OneTouch Delica Lancets 64W MISC Use to check blood sugar once a day.  Dx code E11.65 100 each 1   oxyCODONE-acetaminophen (ROXICET) 5-325 MG tablet Take 1 tablet by mouth every 8 (eight) hours as needed for severe pain. 63 tablet 0   VICTOZA 18 MG/3ML SOPN INJECT 1.8 MG UNDER THE SKIN ONCE DAILY 81 mL 1   diltiazem (CARDIZEM CD) 120 MG 24 hr capsule Take 1 capsule (120 mg total) by mouth daily. 90 capsule 0   FARXIGA 5 MG TABS tablet TAKE 1 TABLET BY MOUTH EVERY DAY 90 tablet 1   Insulin Pen Needle (B-D UF III MINI PEN NEEDLES) 31G X 5 MM MISC USE AS DIRECTED 100 each 1   olmesartan-hydrochlorothiazide (BENICAR HCT) 40-25 MG tablet Take 1 tablet by mouth daily. 90 tablet 1   COVID-19 mRNA bivalent vaccine, Pfizer, (PFIZER COVID-19 VAC BIVALENT) injection Inject into the muscle. (Patient not taking: Reported on 06/02/2021) 0.3 mL 0   predniSONE (DELTASONE) 10 MG tablet TAKE 3 TABLETS PO QD FOR 3 DAYS THEN TAKE 2 TABLETS PO QD FOR 3 DAYS THEN TAKE 1 TABLET PO QD FOR 3 DAYS THEN TAKE 1/2 TAB PO QD FOR 3 DAYS (Patient not taking: Reported on 06/02/2021) 20 tablet 0   promethazine-dextromethorphan (PROMETHAZINE-DM) 6.25-15 MG/5ML syrup Take 5 mLs by mouth 4 (four) times daily as needed. (Patient not taking: Reported on 06/02/2021) 118 mL 0   No facility-administered medications prior to visit.    Allergies   Allergen Reactions   Semaglutide Other (See Comments)    RYBELSUS. Severe headaches.     Review of Systems  Constitutional:  Negative for fever and malaise/fatigue.  HENT:  Negative for congestion.   Eyes:  Negative for blurred vision.  Respiratory:  Negative for cough and shortness of breath.   Cardiovascular:  Negative for chest pain, palpitations  and leg swelling.  Gastrointestinal:  Negative for vomiting.  Musculoskeletal:  Negative for back pain.  Skin:  Negative for rash.  Neurological:  Negative for loss of consciousness and headaches.      Objective:    Physical Exam Vitals and nursing note reviewed.  Constitutional:      General: He is not in acute distress.    Appearance: Normal appearance. He is not ill-appearing.  HENT:     Head: Normocephalic and atraumatic.     Right Ear: External ear normal.     Left Ear: External ear normal.  Eyes:     Extraocular Movements: Extraocular movements intact.     Pupils: Pupils are equal, round, and reactive to light.  Cardiovascular:     Rate and Rhythm: Normal rate and regular rhythm.     Heart sounds: Normal heart sounds. No murmur heard.   No gallop.  Pulmonary:     Effort: Pulmonary effort is normal. No respiratory distress.     Breath sounds: Normal breath sounds. No wheezing or rales.  Skin:    General: Skin is warm and dry.  Neurological:     Mental Status: He is alert and oriented to person, place, and time.  Psychiatric:        Behavior: Behavior normal.        Judgment: Judgment normal.    BP (!) 142/72 (BP Location: Left Arm, Patient Position: Sitting, Cuff Size: Large)    Pulse 95    Temp 97.7 F (36.5 C) (Oral)    Resp 20    Ht 6' (1.829 m)    Wt (!) 355 lb 6.4 oz (161.2 kg)    SpO2 97%    BMI 48.20 kg/m  Wt Readings from Last 3 Encounters:  06/02/21 (!) 355 lb 6.4 oz (161.2 kg)  12/30/20 (!) 353 lb (160.1 kg)  09/25/20 (!) 349 lb 12.8 oz (158.7 kg)    Diabetic Foot Exam - Simple   No data filed     Lab Results  Component Value Date   WBC 7.0 02/06/2013   HGB 15.4 02/06/2013   HCT 45.3 02/06/2013   PLT 234.0 02/06/2013   GLUCOSE 90 09/25/2020   CHOL 133 09/25/2020   TRIG 96.0 09/25/2020   HDL 52.40 09/25/2020   LDLDIRECT 133.0 12/05/2017   LDLCALC 62 09/25/2020   ALT 14 09/25/2020   AST 14 09/25/2020   NA 139 09/25/2020   K 4.7 09/25/2020   CL 101 09/25/2020   CREATININE 1.05 09/25/2020   BUN 20 09/25/2020   CO2 28 09/25/2020   TSH 0.75 01/27/2009   PSA 0.29 01/27/2009   HGBA1C 6.4 09/25/2020   MICROALBUR 3.1 (H) 09/25/2020    Lab Results  Component Value Date   TSH 0.75 01/27/2009   Lab Results  Component Value Date   WBC 7.0 02/06/2013   HGB 15.4 02/06/2013   HCT 45.3 02/06/2013   MCV 89.6 02/06/2013   PLT 234.0 02/06/2013   Lab Results  Component Value Date   NA 139 09/25/2020   K 4.7 09/25/2020   CO2 28 09/25/2020   GLUCOSE 90 09/25/2020   BUN 20 09/25/2020   CREATININE 1.05 09/25/2020   BILITOT 0.5 09/25/2020   ALKPHOS 61 09/25/2020   AST 14 09/25/2020   ALT 14 09/25/2020   PROT 7.5 09/25/2020   ALBUMIN 4.2 09/25/2020   CALCIUM 9.5 09/25/2020   GFR 73.36 09/25/2020   Lab Results  Component Value Date   CHOL 133 09/25/2020  Lab Results  Component Value Date   HDL 52.40 09/25/2020   Lab Results  Component Value Date   LDLCALC 62 09/25/2020   Lab Results  Component Value Date   TRIG 96.0 09/25/2020   Lab Results  Component Value Date   CHOLHDL 3 09/25/2020   Lab Results  Component Value Date   HGBA1C 6.4 09/25/2020       Assessment & Plan:   Problem List Items Addressed This Visit       Unprioritized   Right hip pain   Relevant Orders   Drug Monitoring Panel [id number] , Urine   Cough   Relevant Medications   diltiazem (CARDIZEM CD) 120 MG 24 hr capsule   Controlled type 2 diabetes mellitus with complication, without long-term current use of insulin (HCC)    Check labs today hgba1c acceptable, minimize simple  carbs. Increase exercise as tolerated. Continue current meds       Relevant Medications   dapagliflozin propanediol (FARXIGA) 5 MG TABS tablet   olmesartan-hydrochlorothiazide (BENICAR HCT) 40-25 MG tablet   Other Relevant Orders   CBC with Differential/Platelet   Comprehensive metabolic panel   Lipid panel   Hemoglobin A1c   Hyperlipidemia LDL goal <70    Tolerating statin, encouraged heart healthy diet, avoid trans fats, minimize simple carbs and saturated fats. Increase exercise as tolerated      Relevant Medications   olmesartan-hydrochlorothiazide (BENICAR HCT) 40-25 MG tablet   diltiazem (CARDIZEM CD) 120 MG 24 hr capsule   Primary hypertension - Primary    Well controlled, no changes to meds. Encouraged heart healthy diet such as the DASH diet and exercise as tolerated.       Relevant Medications   olmesartan-hydrochlorothiazide (BENICAR HCT) 40-25 MG tablet   diltiazem (CARDIZEM CD) 120 MG 24 hr capsule   Other Relevant Orders   CBC with Differential/Platelet   Comprehensive metabolic panel   Lipid panel   Hemoglobin A1c   Other Visit Diagnoses     Essential hypertension       Relevant Medications   olmesartan-hydrochlorothiazide (BENICAR HCT) 40-25 MG tablet   diltiazem (CARDIZEM CD) 120 MG 24 hr capsule   Controlled type 2 diabetes mellitus without complication, without long-term current use of insulin (HCC)       Relevant Medications   dapagliflozin propanediol (FARXIGA) 5 MG TABS tablet   olmesartan-hydrochlorothiazide (BENICAR HCT) 40-25 MG tablet   Insulin Pen Needle (B-D UF III MINI PEN NEEDLES) 31G X 5 MM MISC   Need for pneumococcal vaccination       Relevant Orders   Pneumococcal conjugate vaccine 20-valent (Prevnar 20) (Completed)   High risk medication use       Relevant Orders   Drug Monitoring Panel [phone] , Urine        Meds ordered this encounter  Medications   dapagliflozin propanediol (FARXIGA) 5 MG TABS tablet    Sig: Take 1 tablet  (5 mg total) by mouth daily.    Dispense:  90 tablet    Refill:  1   olmesartan-hydrochlorothiazide (BENICAR HCT) 40-25 MG tablet    Sig: Take 1 tablet by mouth daily.    Dispense:  90 tablet    Refill:  1   Insulin Pen Needle (B-D UF III MINI PEN NEEDLES) 31G X 5 MM MISC    Sig: USE AS DIRECTED    Dispense:  100 each    Refill:  1   diltiazem (CARDIZEM CD) 120 MG 24 hr capsule  Sig: Take 1 capsule (120 mg total) by mouth daily.    Dispense:  90 capsule    Refill:  0    I, Dr. Roma Schanz, DO, personally preformed the services described in this documentation.  All medical record entries made by the scribe were at my direction and in my presence.  I have reviewed the chart and discharge instructions (if applicable) and agree that the record reflects my personal performance and is accurate and complete. 06/02/2021   I,Shehryar Baig,acting as a scribe for Ann Held, DO.,have documented all relevant documentation on the behalf of Ann Held, DO,as directed by  Ann Held, DO while in the presence of Ann Held, DO.   Ann Held, DO

## 2021-06-02 NOTE — Assessment & Plan Note (Signed)
Check labs today hgba1c acceptable, minimize simple carbs. Increase exercise as tolerated. Continue current meds

## 2021-06-02 NOTE — Assessment & Plan Note (Signed)
Tolerating statin, encouraged heart healthy diet, avoid trans fats, minimize simple carbs and saturated fats. Increase exercise as tolerated 

## 2021-06-04 LAB — DRUG MONITORING PANEL 376104, URINE
Amphetamines: NEGATIVE ng/mL (ref <500)
Barbiturates: NEGATIVE ng/mL (ref <300)
Benzodiazepines: NEGATIVE ng/mL (ref <100)
Cocaine Metabolite: NEGATIVE ng/mL (ref <150)
Codeine: NEGATIVE ng/mL (ref <50)
Desmethyltramadol: NEGATIVE ng/mL (ref <100)
Hydrocodone: NEGATIVE ng/mL (ref <50)
Hydromorphone: NEGATIVE ng/mL (ref <50)
Morphine: NEGATIVE ng/mL (ref <50)
Norhydrocodone: NEGATIVE ng/mL (ref <50)
Noroxycodone: 775 ng/mL — ABNORMAL HIGH (ref <50)
Opiates: NEGATIVE ng/mL (ref <100)
Oxycodone: 1101 ng/mL — ABNORMAL HIGH (ref <50)
Oxycodone: POSITIVE ng/mL — AB (ref <100)
Oxymorphone: 805 ng/mL — ABNORMAL HIGH (ref <50)
Tramadol: NEGATIVE ng/mL (ref <100)

## 2021-06-04 LAB — DM TEMPLATE

## 2021-07-30 ENCOUNTER — Other Ambulatory Visit: Payer: Self-pay | Admitting: Family Medicine

## 2021-07-30 DIAGNOSIS — E118 Type 2 diabetes mellitus with unspecified complications: Secondary | ICD-10-CM

## 2021-08-14 ENCOUNTER — Other Ambulatory Visit: Payer: Self-pay | Admitting: Family Medicine

## 2021-08-14 DIAGNOSIS — M1712 Unilateral primary osteoarthritis, left knee: Secondary | ICD-10-CM

## 2021-08-14 MED ORDER — OXYCODONE-ACETAMINOPHEN 5-325 MG PO TABS: 1.0000 | ORAL_TABLET | Freq: Three times a day (TID) | ORAL | 0 refills | Status: DC | PRN

## 2021-08-14 NOTE — Telephone Encounter (Signed)
Requesting: oxycodone 5-325mg  ?Contract:06/02/2021 ?UDS:06/02/2021 ?Last Visit:06/02/2021 ?Next Visit: 12/01/2021 ?Last Refill:05/18/2021 #63 and 0RF ? ?Please Advise ? ?

## 2021-10-20 IMAGING — DX DG HIP (WITH OR WITHOUT PELVIS) 2-3V*R*
3 series · 3 of 3 positions shown · non-contrast
Comparison: None.

CLINICAL DATA: Right hip pain.

EXAM:
DG HIP (WITH OR WITHOUT PELVIS) 2-3V RIGHT

[pelvis ap]
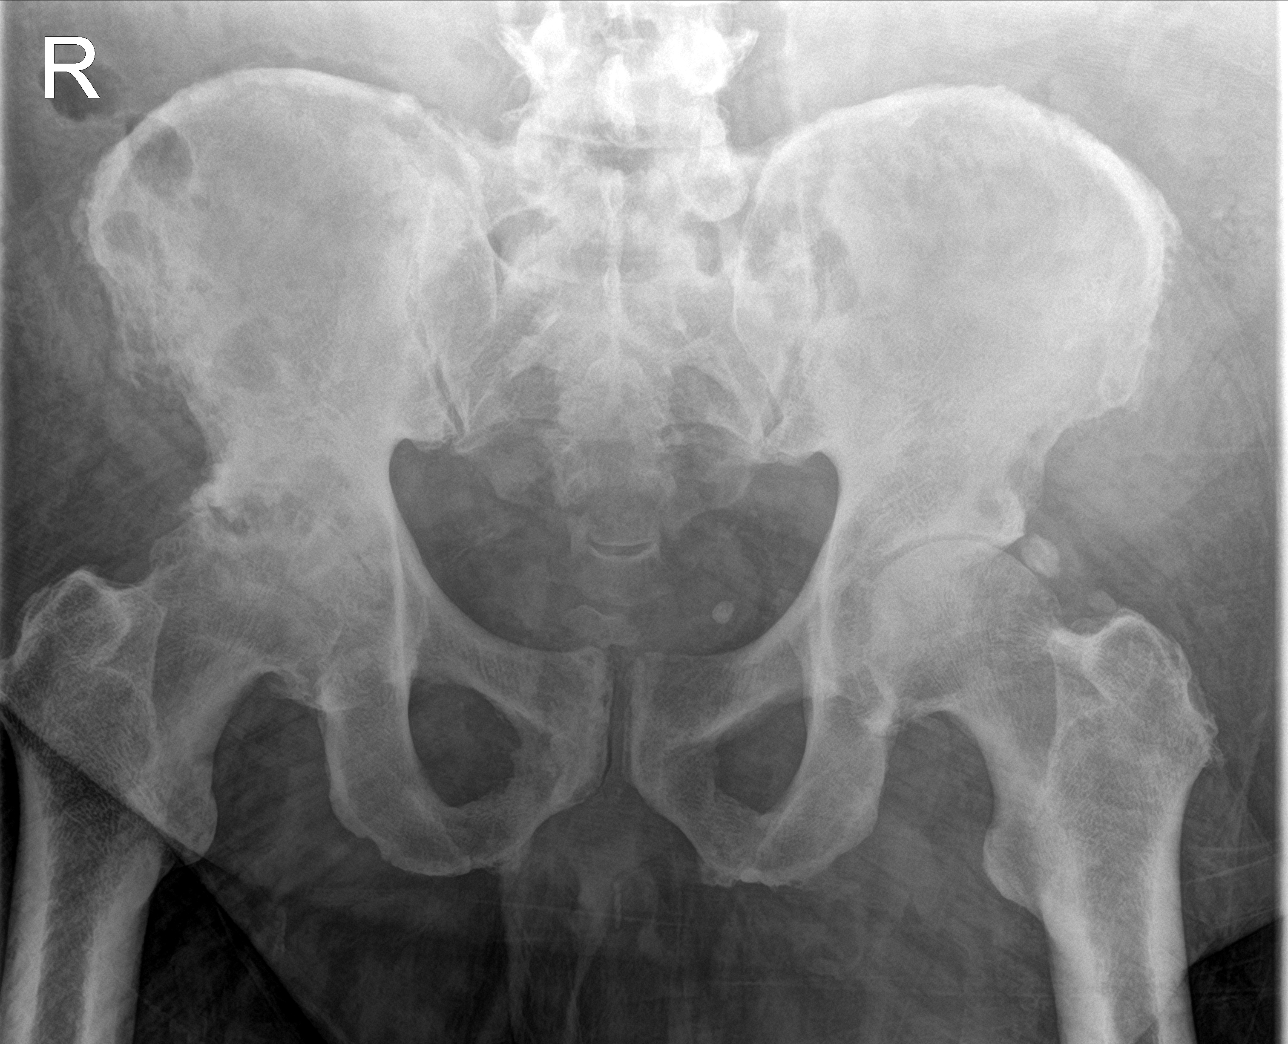

[hip ap]
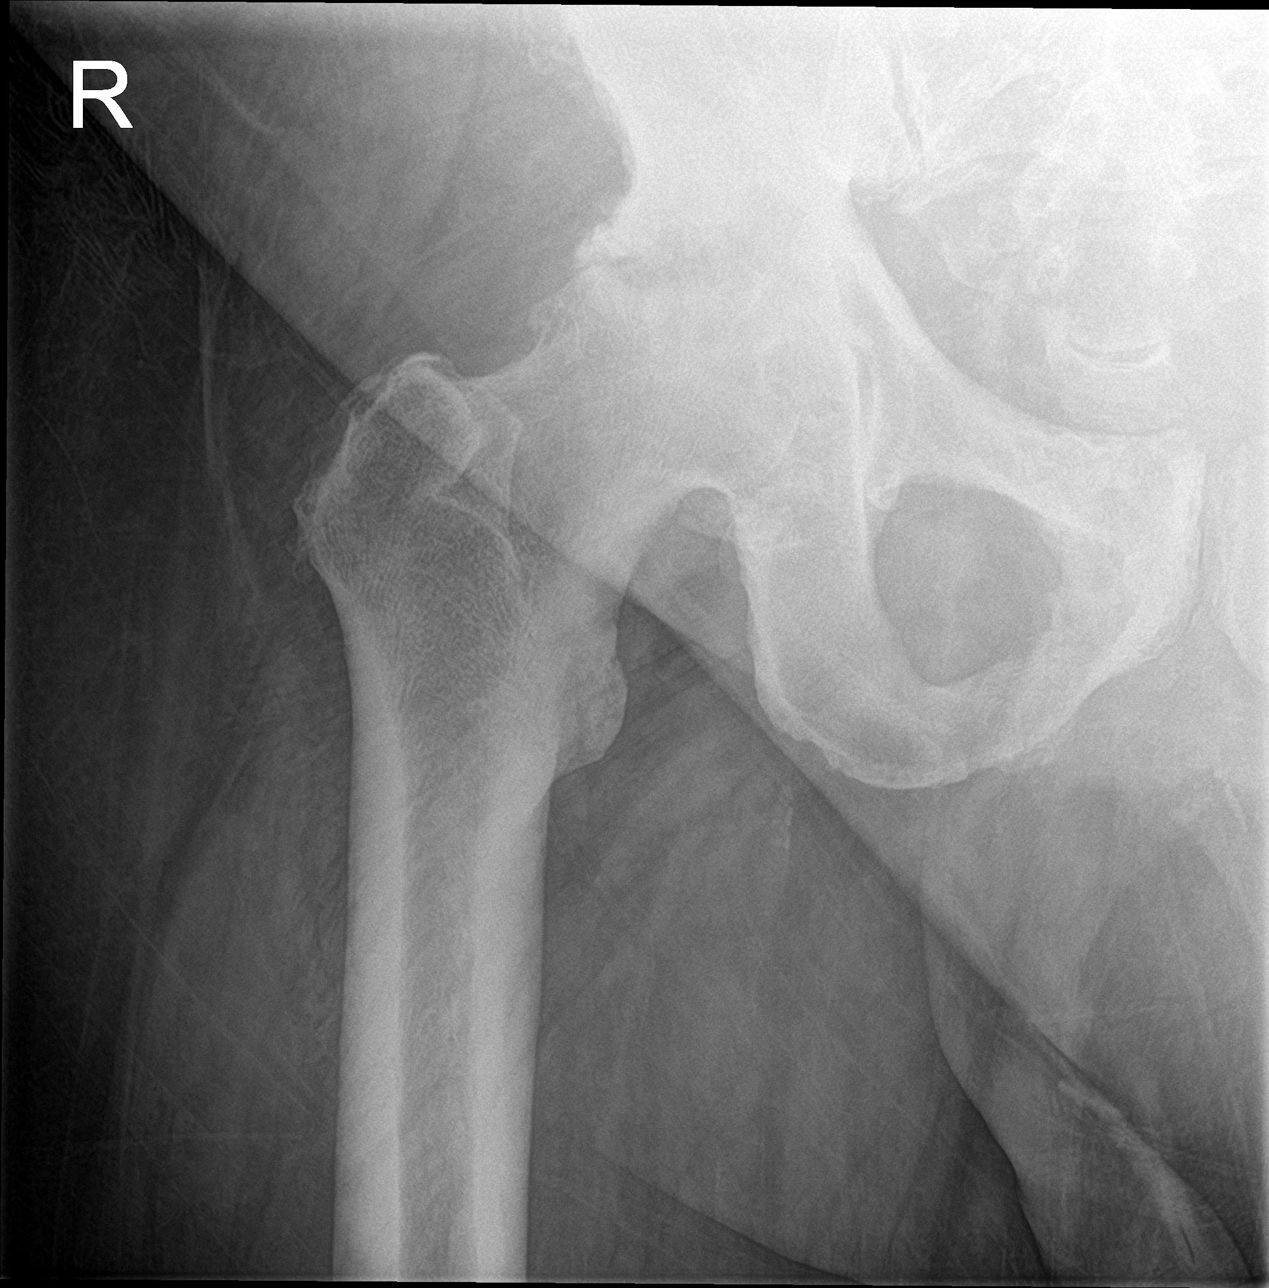

[hip lat]
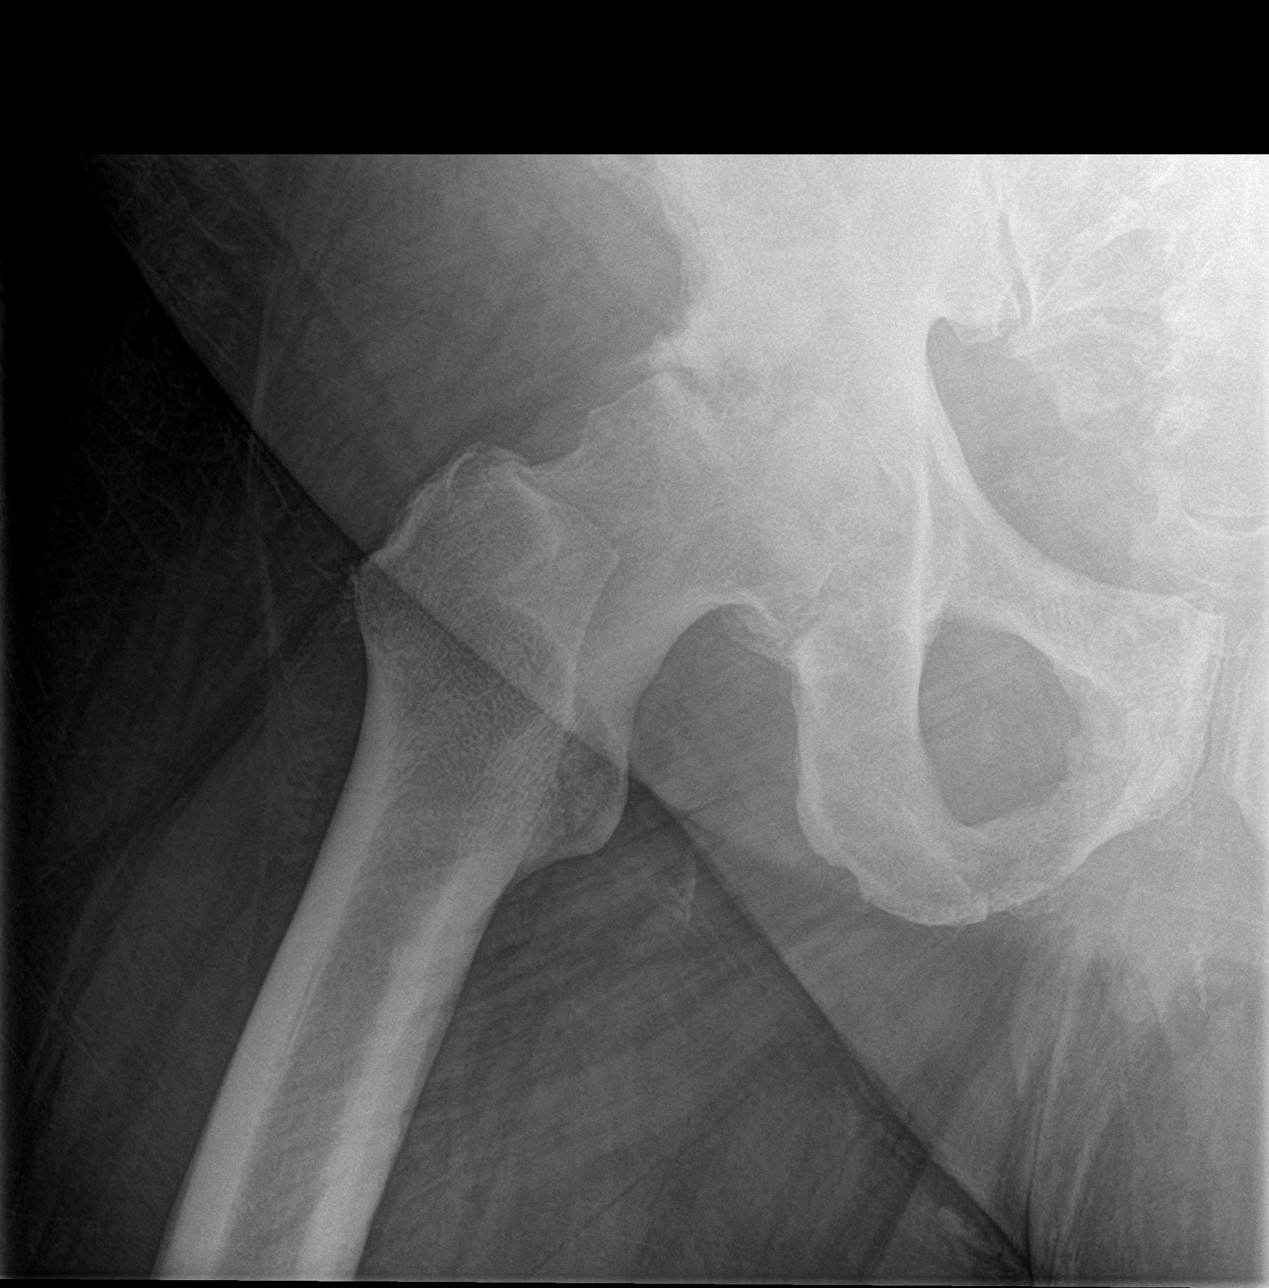

[3 of 3 positions shown; findings below may reference images not displayed]

FINDINGS: Advanced end-stage right hip osteoarthritis with complete joint
space loss. There are peripheral femoral head and acetabular spurs.
Subchondral lucency involving the femoral head may represent
avascular necrosis with collapse or large subchondral cyst.
Subchondral cystic changes are also seen in the acetabulum. There is
moderate left hip osteoarthritis with joint space narrowing. Left os
acetabula. Pubic rami are intact. No evidence of focal bone lesion.
Chronic appearing enthesopathic changes of the right iliac crest.
IMPRESSION: 1. Advanced end-stage right hip osteoarthritis. Subchondral lucency
involving the femoral head may represent avascular necrosis with
collapse or large subchondral cyst.
2. Moderate left hip osteoarthritis.

## 2021-10-27 ENCOUNTER — Other Ambulatory Visit: Payer: Self-pay | Admitting: Family Medicine

## 2021-10-27 DIAGNOSIS — R059 Cough, unspecified: Secondary | ICD-10-CM

## 2021-11-12 ENCOUNTER — Other Ambulatory Visit: Payer: Self-pay | Admitting: Family Medicine

## 2021-11-12 DIAGNOSIS — M1712 Unilateral primary osteoarthritis, left knee: Secondary | ICD-10-CM

## 2021-11-12 MED ORDER — OXYCODONE-ACETAMINOPHEN 5-325 MG PO TABS: 1.0000 | ORAL_TABLET | Freq: Three times a day (TID) | ORAL | 0 refills | Status: DC | PRN

## 2021-11-12 NOTE — Telephone Encounter (Signed)
Requesting: oxycodone Contract: will get at next visit UDS: will get at next visit Last Visit: 06/02/21 Next Visit: 12/01/21  Last Refill: 08/14/21  Please Advise

## 2021-11-30 ENCOUNTER — Telehealth: Payer: Self-pay

## 2021-11-30 NOTE — Telephone Encounter (Signed)
Noted  

## 2021-11-30 NOTE — Telephone Encounter (Signed)
Patient called stating he would like his appointment tomorrow private no scribe .

## 2021-12-01 ENCOUNTER — Encounter: Payer: Self-pay | Admitting: Family Medicine

## 2021-12-01 ENCOUNTER — Ambulatory Visit: Payer: BC Managed Care – PPO | Attending: Internal Medicine

## 2021-12-01 ENCOUNTER — Other Ambulatory Visit: Payer: Self-pay | Admitting: Family Medicine

## 2021-12-01 ENCOUNTER — Ambulatory Visit (INDEPENDENT_AMBULATORY_CARE_PROVIDER_SITE_OTHER): Payer: BC Managed Care – PPO | Admitting: Family Medicine

## 2021-12-01 ENCOUNTER — Other Ambulatory Visit (HOSPITAL_BASED_OUTPATIENT_CLINIC_OR_DEPARTMENT_OTHER): Payer: Self-pay

## 2021-12-01 VITALS — BP 160/70 | HR 95 | Temp 98.0°F | Resp 22 | Ht 72.0 in | Wt 337.4 lb

## 2021-12-01 DIAGNOSIS — Z23 Encounter for immunization: Secondary | ICD-10-CM

## 2021-12-01 DIAGNOSIS — T148XXA Other injury of unspecified body region, initial encounter: Secondary | ICD-10-CM

## 2021-12-01 DIAGNOSIS — E118 Type 2 diabetes mellitus with unspecified complications: Secondary | ICD-10-CM | POA: Diagnosis not present

## 2021-12-01 DIAGNOSIS — Z0000 Encounter for general adult medical examination without abnormal findings: Secondary | ICD-10-CM

## 2021-12-01 DIAGNOSIS — I10 Essential (primary) hypertension: Secondary | ICD-10-CM

## 2021-12-01 DIAGNOSIS — E785 Hyperlipidemia, unspecified: Secondary | ICD-10-CM | POA: Diagnosis not present

## 2021-12-01 DIAGNOSIS — M25551 Pain in right hip: Secondary | ICD-10-CM

## 2021-12-01 DIAGNOSIS — Z79899 Other long term (current) drug therapy: Secondary | ICD-10-CM

## 2021-12-01 LAB — LIPID PANEL
Cholesterol: 153 mg/dL (ref 0–200)
HDL: 47.8 mg/dL (ref >39.00)
LDL Cholesterol: 81 mg/dL (ref 0–99)
NonHDL: 105.47
Total CHOL/HDL Ratio: 3
Triglycerides: 120 mg/dL (ref 0.0–149.0)
VLDL: 24 mg/dL (ref 0.0–40.0)

## 2021-12-01 LAB — CBC WITH DIFFERENTIAL/PLATELET
Basophils Absolute: 0.1 10*3/uL (ref 0.0–0.1)
Basophils Relative: 0.9 % (ref 0.0–3.0)
Eosinophils Absolute: 0 10*3/uL (ref 0.0–0.7)
Eosinophils Relative: 0.6 % (ref 0.0–5.0)
HCT: 43.5 % (ref 39.0–52.0)
Hemoglobin: 14.5 g/dL (ref 13.0–17.0)
Lymphocytes Relative: 13.4 % (ref 12.0–46.0)
Lymphs Abs: 1 10*3/uL (ref 0.7–4.0)
MCHC: 33.3 g/dL (ref 30.0–36.0)
MCV: 96.1 fl (ref 78.0–100.0)
Monocytes Absolute: 0.8 10*3/uL (ref 0.1–1.0)
Monocytes Relative: 11.3 % (ref 3.0–12.0)
Neutro Abs: 5.5 10*3/uL (ref 1.4–7.7)
Neutrophils Relative %: 73.8 % (ref 43.0–77.0)
Platelets: 271 10*3/uL (ref 150.0–400.0)
RBC: 4.52 Mil/uL (ref 4.22–5.81)
RDW: 13.5 % (ref 11.5–15.5)
WBC: 7.4 10*3/uL (ref 4.0–10.5)

## 2021-12-01 LAB — COMPREHENSIVE METABOLIC PANEL
ALT: 11 U/L (ref 0–53)
AST: 13 U/L (ref 0–37)
Albumin: 4.3 g/dL (ref 3.5–5.2)
Alkaline Phosphatase: 64 U/L (ref 39–117)
BUN: 16 mg/dL (ref 6–23)
CO2: 28 mEq/L (ref 19–32)
Calcium: 9.7 mg/dL (ref 8.4–10.5)
Chloride: 96 mEq/L (ref 96–112)
Creatinine, Ser: 0.98 mg/dL (ref 0.40–1.50)
GFR: 79.03 mL/min (ref >60.00)
Glucose, Bld: 95 mg/dL (ref 70–99)
Potassium: 3.9 mEq/L (ref 3.5–5.1)
Sodium: 135 mEq/L (ref 135–145)
Total Bilirubin: 0.7 mg/dL (ref 0.2–1.2)
Total Protein: 7.6 g/dL (ref 6.0–8.3)

## 2021-12-01 LAB — HEMOGLOBIN A1C: Hgb A1c MFr Bld: 6.4 % (ref 4.6–6.5)

## 2021-12-01 LAB — PSA: PSA: 0.25 ng/mL (ref 0.10–4.00)

## 2021-12-01 LAB — MICROALBUMIN / CREATININE URINE RATIO
Creatinine,U: 89.2 mg/dL
Microalb Creat Ratio: 4.9 mg/g (ref 0.0–30.0)
Microalb, Ur: 4.4 mg/dL — ABNORMAL HIGH (ref 0.0–1.9)

## 2021-12-01 MED ORDER — ACCU-CHEK GUIDE VI STRP: ORAL_STRIP | 12 refills | Status: DC

## 2021-12-01 MED ORDER — OLMESARTAN MEDOXOMIL-HCTZ 40-25 MG PO TABS
1.0000 | ORAL_TABLET | Freq: Every day | ORAL | 1 refills | Status: DC
Start: 2021-12-01 — End: 2022-10-05

## 2021-12-01 MED ORDER — TRIAMCINOLONE ACETONIDE 0.1 % EX CREA: 1.0000 | TOPICAL_CREAM | Freq: Two times a day (BID) | CUTANEOUS | 0 refills | Status: DC

## 2021-12-01 MED ORDER — OXYCODONE-ACETAMINOPHEN 10-325 MG PO TABS: 1.0000 | ORAL_TABLET | Freq: Three times a day (TID) | ORAL | 0 refills | Status: DC | PRN

## 2021-12-01 MED ORDER — BLOOD GLUCOSE MONITOR KIT: PACK | 0 refills | Status: DC

## 2021-12-01 MED ORDER — PFIZER COVID-19 VAC BIVALENT 30 MCG/0.3ML IM SUSP
INTRAMUSCULAR | 0 refills | Status: DC
  Filled 2021-12-01: qty 0.3, 1d supply, fill #0

## 2021-12-01 MED ORDER — TIZANIDINE HCL 4 MG PO TABS: 4.0000 mg | ORAL_TABLET | Freq: Four times a day (QID) | ORAL | 0 refills | Status: DC | PRN

## 2021-12-01 MED ORDER — ACCU-CHEK SOFTCLIX LANCETS MISC: 12 refills | Status: DC

## 2021-12-01 NOTE — Patient Instructions (Signed)
Preventive Care Richard [name], Richard [name] Preventive care refers to lifestyle choices and visits with your health care provider that can promote health and wellness. Preventive care visits are also called wellness exams. What can I expect for my preventive care visit? Counseling During your preventive care visit, your health care provider may ask about your: Medical history, including: Past medical problems. Family medical history. History of falls. Current health, including: Emotional well-being. Home life and relationship well-being. Sexual activity. Memory and ability to understand (cognition). Lifestyle, including: Alcohol, nicotine or tobacco, and drug use. Access to firearms. Diet, exercise, and sleep habits. Work and work environment. Sunscreen use. Safety issues such as seatbelt and bike helmet use. Physical exam Your health care provider will check your: Height and weight. These may be used to calculate your BMI (body mass index). BMI is a measurement that tells if you are at a healthy weight. Waist circumference. This measures the distance around your waistline. This measurement also tells if you are at a healthy weight and may help predict your risk of certain diseases, such as type 2 diabetes and high blood pressure. Heart rate and blood pressure. Body temperature. Skin for abnormal spots. What immunizations do I need?  Vaccines are usually given at various ages, according to a schedule. Your health care provider will recommend vaccines for you based on your age, medical history, and lifestyle or other factors, such as travel or where you work. What tests do I need? Screening Your health care provider may recommend screening tests for certain conditions. This may include: Lipid and cholesterol levels. Diabetes screening. This is done by checking your blood sugar (glucose) after you have not eaten for a while (fasting). Hepatitis C test. Hepatitis B test. HIV (human  immunodeficiency virus) test. STI (sexually transmitted infection) testing, if you are at risk. Lung cancer screening. Colorectal cancer screening. Prostate cancer screening. Abdominal aortic aneurysm (AAA) screening. You may need this if you are a current or former smoker. Talk with your health care provider about your test results, treatment options, and if necessary, the need for more tests. Follow these instructions at home: Eating and drinking  Eat a diet that includes fresh fruits and vegetables, whole grains, lean protein, and low-fat dairy products. Limit your intake of foods with high amounts of sugar, saturated fats, and salt. Take vitamin and mineral supplements as recommended by your health care provider. Do not drink alcohol if your health care provider tells you not to drink. If you drink alcohol: Limit how much you have to 0-2 drinks a day. Know how much alcohol is in your drink. In the [place] one drink equals one 12 oz bottle of beer (355 mL), one 5 oz glass of wine (148 mL), or one 1 oz glass of hard liquor (44 mL). Lifestyle Brush your teeth every morning and night with fluoride toothpaste. Floss one time each day. Exercise for at least 30 minutes 5 or more days each week. Do not use any products that contain nicotine or tobacco. These products include cigarettes, chewing tobacco, and vaping devices, such as e-cigarettes. If you need help quitting, ask your health care provider. Do not use drugs. If you are sexually active, practice safe sex. Use a condom or other form of protection to prevent STIs. Take aspirin only as told by your health care provider. Make sure that you understand how much to take and what form to take. Work with your health care provider to find out whether it is safe   and beneficial for you to take aspirin daily. Ask your health care provider if you need to take a cholesterol-lowering medicine (statin). Find healthy ways to manage stress, such  as: Meditation, yoga, or listening to music. Journaling. Talking to a trusted person. Spending time with friends and family. Safety Always wear your seat belt while driving or riding in a vehicle. Do not drive: If you have been drinking alcohol. Do not ride with someone who has been drinking. When you are tired or distracted. While texting. If you have been using any mind-altering substances or drugs. Wear a helmet and other protective equipment during sports activities. If you have firearms in your house, make sure you follow all gun safety procedures. Minimize exposure to UV radiation to reduce your risk of skin cancer. What's next? Visit your health care provider once a year for an annual wellness visit. Ask your health care provider how often you should have your eyes and teeth checked. Stay up to date on all vaccines. This information is not intended to replace advice given to you by your health care provider. Make sure you discuss any questions you have with your health care provider. Document Revised: 11/26/2020 Document Reviewed: 11/26/2020 Elsevier Patient Education  2023 [employer]  

## 2021-12-01 NOTE — Progress Notes (Signed)
Established Patient Office Visit  Subjective   Patient ID: Richard [name], male    DOB: [birth year]  Age: 71 y.o. MRN: [id number]  Chief Complaint  Patient presents with  . Annual Exam    Pt states fasting     HPI Pt is here for cpe.  He had his covid booster today.   He also is requesting a referral to ortho for his hip pain.   He   Patient Active Problem List   Diagnosis Date Noted  . Controlled type 2 diabetes mellitus with complication, without long-term current use of insulin ([place]) 06/02/2021  . Cough 12/30/2020  . Uncontrolled type 2 diabetes mellitus with hyperglycemia ([place]) 02/21/2020  . Right hip pain 08/16/2018  . Viral upper respiratory tract infection 03/10/2017  . Hx of adenomatous colonic polyps   . Benign neoplasm of descending colon   . Primary hypertension 09/06/2016  . Tooth abscess 09/01/2015  . Sinusitis 11/08/2014  . Sinusitis, acute 10/28/2014  . Trigger ring finger of right hand 02/06/2013  . Suspicious mole 02/06/2013  . Avulsion of toenail 12/11/2010  . Acute sinusitis 08/17/2010  . WOUND, FINGER 07/10/2010  . SCIATICA, LEFT 09/02/2009  . UNSPECIFIED PERIPHERAL VASCULAR DISEASE 07/04/2009  . Hyperlipidemia LDL goal <70 05/05/2009  . HEARTBURN 04/22/2009  . DM (diabetes mellitus) type II uncontrolled, periph vascular disorder ([place]) 01/23/2009   Past Medical History:  Diagnosis Date  . Allergy    seasonal  . Arthritis     left knee,  right hip  . Complication of anesthesia    half awake during last colonscopy  . Diabetes mellitus    type 2  . Hx of adenomatous colonic polyps    2010 6 mm adenoma 10/06/2016 2 5 mm descending polyps   . Hyperlipidemia   . Hypertension    Past Surgical History:  Procedure Laterality Date  . COLONOSCOPY    . COLONOSCOPY WITH PROPOFOL N/A 10/06/2016   Procedure: COLONOSCOPY WITH PROPOFOL;  Surgeon: Gatha Mayer, MD;  Location: [facility];  Service: Endoscopy;  Laterality: N/A;   Social History    Tobacco Use  . Smoking status: Never  . Smokeless tobacco: Never  Substance Use Topics  . Alcohol use: Yes    Comment: occasionally   . Drug use: No   Social History   Socioeconomic History  . Marital status: Married    Spouse name: Not on file  . Number of children: Not on file  . Years of education: Not on file  . Highest education level: Not on file  Occupational History  . Not on file  Tobacco Use  . Smoking status: Never  . Smokeless tobacco: Never  Substance and Sexual Activity  . Alcohol use: Yes    Comment: occasionally   . Drug use: No  . Sexual activity: Not on file  Other Topics Concern  . Not on file  Social History Narrative  . Not on file   Social Determinants of Health   Financial Resource Strain: Not on file  Food Insecurity: Not on file  Transportation Needs: Not on file  Physical Activity: Not on file  Stress: Not on file  Social Connections: Not on file  Intimate Partner Violence: Not on file   Family Status  Relation Name Status  . Mother  (Not Specified)  . Sister  (Not Specified)  . Brother  (Not Specified)  . Neg Hx  (Not Specified)   Family History  Problem Relation Age of Onset  .  Diabetes Mother   . Diabetes Sister   . Hypertension Sister   . Stroke Sister   . Diabetes Brother   . Colon cancer Neg Hx   . Colon polyps Neg Hx   . Rectal cancer Neg Hx   . Stomach cancer Neg Hx   . Esophageal cancer Neg Hx    Allergies  Allergen Reactions  . Semaglutide Other (See Comments)    RYBELSUS. Severe headaches.       Review of Systems  Constitutional:  Negative for fever and malaise/fatigue.  HENT:  Negative for congestion.   Eyes:  Negative for blurred vision.  Respiratory:  Negative for cough and shortness of breath.   Cardiovascular:  Negative for chest pain, palpitations and leg swelling.  Gastrointestinal:  Negative for vomiting.  Musculoskeletal:  Positive for back pain and joint pain.  Skin:  Negative for rash.   Neurological:  Negative for loss of consciousness and headaches.     Objective:     BP (!) 160/70 (BP Location: Left Arm, Patient Position: Sitting, Cuff Size: Large)   Pulse 95   Temp 98 F (36.7 C) (Oral)   Resp (!) 22   Ht 6' (1.829 m)   Wt (!) 337 lb 6.4 oz (153 kg)   SpO2 97%   BMI 45.76 kg/m  BP Readings from Last 3 Encounters:  12/01/21 (!) 160/70  06/02/21 (!) 142/72  12/30/20 (!) 144/78   Wt Readings from Last 3 Encounters:  12/01/21 (!) 337 lb 6.4 oz (153 kg)  06/02/21 (!) 355 lb 6.4 oz (161.2 kg)  12/30/20 (!) 353 lb (160.1 kg)   SpO2 Readings from Last 3 Encounters:  12/01/21 97%  06/02/21 97%  12/30/20 96%      Physical Exam Vitals and nursing note reviewed.  Constitutional:      Appearance: He is well-developed.  HENT:     Head: Normocephalic and atraumatic.  Eyes:     Pupils: Pupils are equal, round, and reactive to light.  Neck:     Thyroid: No thyromegaly.  Cardiovascular:     Rate and Rhythm: Normal rate and regular rhythm.     Heart sounds: No murmur heard. Pulmonary:     Effort: Pulmonary effort is normal. No respiratory distress.     Breath sounds: Normal breath sounds. No wheezing or rales.  Chest:     Chest wall: No tenderness.  Musculoskeletal:        General: Tenderness present.     Cervical back: Normal range of motion and neck supple.     Right hip: Tenderness present. Normal range of motion. Normal strength.     Left hip: Tenderness present. Normal range of motion. Normal strength.     Right foot: Bony tenderness present. No swelling.     Left foot: Bony tenderness present. No swelling.     Comments:  Rhip pain with weight bearing    Skin:    General: Skin is warm and dry.  Neurological:     Mental Status: He is alert and oriented to person, place, and time.  Psychiatric:        Behavior: Behavior normal.        Thought Content: Thought content normal.        Judgment: Judgment normal.    No results found for any  visits on 12/01/21.    The 10-year ASCVD risk score (Arnett DK, et al., 2019) is: 40.9%    Assessment & Plan:   Problem List Items  Addressed This Visit   None Visit Diagnoses     High risk medication use    -  Primary   Relevant Orders   Drug Monitoring Panel [phone] , Urine   Essential hypertension       Relevant Medications   olmesartan-hydrochlorothiazide (BENICAR HCT) 40-25 MG tablet       No follow-ups on file.    Ann Held, DO

## 2021-12-01 NOTE — Progress Notes (Signed)
   Covid-19 Vaccination Clinic  Name:  Richard [name]    MRN: [id number] DOB: 1951/01/08  12/01/2021  [name] was observed post Covid-19 immunization for 15 minutes without incident. He was provided with Vaccine Information Sheet and instruction to access the V-Safe system.   [name] was instructed to call 911 with any severe reactions post vaccine: Difficulty breathing  Swelling of face and throat  A fast heartbeat  A bad rash all over body  Dizziness and weakness   Immunizations Administered     Name Date Dose VIS Date Route   Pfizer Covid-19 Vaccine Bivalent Booster 12/01/2021  9:38 AM 0.3 mL 02/11/2021 Intramuscular   Manufacturer: [place]   Lot: [id number]   [place]: [phone]

## 2021-12-03 LAB — DM TEMPLATE

## 2021-12-03 LAB — DRUG MONITORING PANEL 376104, URINE
Amphetamines: NEGATIVE ng/mL (ref <500)
Barbiturates: NEGATIVE ng/mL (ref <300)
Benzodiazepines: NEGATIVE ng/mL (ref <100)
Cocaine Metabolite: NEGATIVE ng/mL (ref <150)
Codeine: NEGATIVE ng/mL (ref <50)
Desmethyltramadol: NEGATIVE ng/mL (ref <100)
Hydrocodone: NEGATIVE ng/mL (ref <50)
Hydromorphone: NEGATIVE ng/mL (ref <50)
Morphine: NEGATIVE ng/mL (ref <50)
Norhydrocodone: NEGATIVE ng/mL (ref <50)
Noroxycodone: 750 ng/mL — ABNORMAL HIGH (ref <50)
Opiates: NEGATIVE ng/mL (ref <100)
Oxycodone: 595 ng/mL — ABNORMAL HIGH (ref <50)
Oxycodone: POSITIVE ng/mL — AB (ref <100)
Oxymorphone: 707 ng/mL — ABNORMAL HIGH (ref <50)
Tramadol: NEGATIVE ng/mL (ref <100)

## 2022-01-03 ENCOUNTER — Other Ambulatory Visit: Payer: Self-pay | Admitting: Family Medicine

## 2022-01-03 DIAGNOSIS — T148XXA Other injury of unspecified body region, initial encounter: Secondary | ICD-10-CM

## 2022-01-04 MED ORDER — TRIAMCINOLONE ACETONIDE 0.1 % EX CREA: 1.0000 | TOPICAL_CREAM | Freq: Two times a day (BID) | CUTANEOUS | 0 refills | Status: DC

## 2022-01-05 ENCOUNTER — Other Ambulatory Visit: Payer: Self-pay | Admitting: Family Medicine

## 2022-01-05 DIAGNOSIS — M25551 Pain in right hip: Secondary | ICD-10-CM

## 2022-01-05 MED ORDER — TIZANIDINE HCL 4 MG PO TABS: 4.0000 mg | ORAL_TABLET | Freq: Four times a day (QID) | ORAL | 0 refills | Status: DC | PRN

## 2022-01-25 ENCOUNTER — Other Ambulatory Visit: Payer: Self-pay | Admitting: Family Medicine

## 2022-01-25 DIAGNOSIS — R059 Cough, unspecified: Secondary | ICD-10-CM

## 2022-02-09 ENCOUNTER — Other Ambulatory Visit: Payer: Self-pay | Admitting: Family Medicine

## 2022-02-09 DIAGNOSIS — M25551 Pain in right hip: Secondary | ICD-10-CM

## 2022-02-09 MED ORDER — TIZANIDINE HCL 4 MG PO TABS: 4.0000 mg | ORAL_TABLET | Freq: Four times a day (QID) | ORAL | 0 refills | Status: DC | PRN

## 2022-02-23 ENCOUNTER — Telehealth: Payer: Self-pay | Admitting: Family Medicine

## 2022-02-23 NOTE — Telephone Encounter (Signed)
Pt called stating that he would like to talk to Dr. Nonda Lou CMA to fins out why some of the meds he was taking had been dropped off of his med list.

## 2022-02-25 ENCOUNTER — Other Ambulatory Visit: Payer: Self-pay | Admitting: Family Medicine

## 2022-02-25 ENCOUNTER — Encounter: Payer: Self-pay | Admitting: Family Medicine

## 2022-02-25 ENCOUNTER — Telehealth: Payer: Self-pay | Admitting: Family Medicine

## 2022-02-25 ENCOUNTER — Ambulatory Visit: Payer: BC Managed Care – PPO | Admitting: Family Medicine

## 2022-02-25 DIAGNOSIS — E785 Hyperlipidemia, unspecified: Secondary | ICD-10-CM

## 2022-02-25 DIAGNOSIS — M25511 Pain in right shoulder: Secondary | ICD-10-CM | POA: Diagnosis not present

## 2022-02-25 DIAGNOSIS — H6691 Otitis media, unspecified, right ear: Secondary | ICD-10-CM | POA: Diagnosis not present

## 2022-02-25 DIAGNOSIS — I10 Essential (primary) hypertension: Secondary | ICD-10-CM | POA: Diagnosis not present

## 2022-02-25 DIAGNOSIS — M25551 Pain in right hip: Secondary | ICD-10-CM

## 2022-02-25 MED ORDER — OXYCODONE-ACETAMINOPHEN 5-325 MG PO TABS: 1.0000 | ORAL_TABLET | Freq: Three times a day (TID) | ORAL | 0 refills | Status: DC | PRN

## 2022-02-25 MED ORDER — OXYCODONE-ACETAMINOPHEN 10-325 MG PO TABS: 1.0000 | ORAL_TABLET | Freq: Three times a day (TID) | ORAL | 0 refills | Status: DC | PRN

## 2022-02-25 MED ORDER — OXYCODONE-ACETAMINOPHEN 5-325 MG PO TABS: 1.0000 | ORAL_TABLET | ORAL | 0 refills | Status: DC | PRN

## 2022-02-25 NOTE — Progress Notes (Addendum)
Subjective:   By signing my name below, I, Shehryar Baig, attest that this documentation has been prepared under the direction and in the presence of Ann Held, DO  02/25/2022    Patient ID: Richard [name], male    DOB: 1951-01-12, 71 y.o.   MRN: [id number]  Chief Complaint  Patient presents with   Shoulder Pain    Right shoulder pain, x3 days, no falls or injuries.     HPI Patient is in today for a office visit.   He is requesting a refill for 5-325 mg oxycodone-acetaminophen PRN.  He needs to have his right hip replaced. He is currently discussing the procedure with his orthopedist specialist and reports he is required to lose weight before the procedure. He has trouble exercising due to his right hip pain. He had received an injection on his right hip recently and reports finding great relief following the injection. He is currently on a diet. He is interested in weight loss medication to help assist with weight management.  Wt Readings from Last 3 Encounters:  02/25/22 (!) 323 lb (146.5 kg)  12/01/21 (!) 337 lb 6.4 oz (153 kg)  06/02/21 (!) 355 lb 6.4 oz (161.2 kg)   He complains of pain in his right shoulder blade. He is taking OTC tylenol extra strength and is icing his shoulder but finds no change in his symptoms. He thinks it is due to him leaning onto his right side when sleeping on his recliner at night. He walks with a cane and carries it with his left hand. He has an upcomming appointment with his chiropractor this week and is planning on follow up his symptoms with them.    Past Medical History:  Diagnosis Date   Allergy    seasonal   Arthritis     left knee,  right hip   Complication of anesthesia    half awake during last colonscopy   Diabetes mellitus    type 2   Hx of adenomatous colonic polyps    2010 6 mm adenoma 10/06/2016 2 5 mm descending polyps    Hyperlipidemia    Hypertension     Past Surgical History:  Procedure Laterality Date    COLONOSCOPY     COLONOSCOPY WITH PROPOFOL N/A 10/06/2016   Procedure: COLONOSCOPY WITH PROPOFOL;  Surgeon: Gatha Mayer, MD;  Location: [facility];  Service: Endoscopy;  Laterality: N/A;    Family History  Problem Relation Age of Onset   Diabetes Mother    Diabetes Sister    Hypertension Sister    Stroke Sister    Diabetes Brother    Colon cancer Neg Hx    Colon polyps Neg Hx    Rectal cancer Neg Hx    Stomach cancer Neg Hx    Esophageal cancer Neg Hx     Social History   Socioeconomic History   Marital status: Married    Spouse name: Not on file   Number of children: Not on file   Years of education: Not on file   Highest education level: Not on file  Occupational History   Not on file  Tobacco Use   Smoking status: Never   Smokeless tobacco: Never  Substance and Sexual Activity   Alcohol use: Yes    Comment: occasionally    Drug use: No   Sexual activity: Not on file  Other Topics Concern   Not on file  Social History Narrative   Not on file  Social Determinants of Health   Financial Resource Strain: Not on file  Food Insecurity: Not on file  Transportation Needs: Not on file  Physical Activity: Not on file  Stress: Not on file  Social Connections: Not on file  Intimate Partner Violence: Not on file    Outpatient Medications Prior to Visit  Medication Sig Dispense Refill   Accu-Chek Softclix Lancets lancets Check blood sugars once daily 100 each 12   ammonium lactate (LAC-HYDRIN) 12 % cream Apply topically as needed for dry skin. 385 g 0   atorvastatin (LIPITOR) 40 MG tablet TAKE 1 TABLET BY MOUTH AT BEDTIME. 90 tablet 1   Blood Glucose Monitoring Suppl (ACCU-CHEK GUIDE ME) w/Device KIT Check blood sugars once daily 1 kit 0   diltiazem (CARDIZEM CD) 120 MG 24 hr capsule TAKE 1 CAPSULE BY MOUTH EVERY DAY 90 capsule 0   FARXIGA 5 MG TABS tablet TAKE 1 TABLET BY MOUTH EVERY DAY 90 tablet 1   fenofibrate 160 MG tablet Take 1 tablet (160 mg total) by  mouth daily. 90 tablet 1   glucose blood (ACCU-CHEK GUIDE) test strip Check blood sugars once daily 100 each 12   metFORMIN (GLUCOPHAGE) 500 MG tablet TAKE 2 TABLETS (1,000 MG TOTAL) BY MOUTH 2 (TWO) TIMES DAILY WITH A MEAL. 360 tablet 1   olmesartan-hydrochlorothiazide (BENICAR HCT) 40-25 MG tablet Take 1 tablet by mouth daily. 90 tablet 1   tiZANidine (ZANAFLEX) 4 MG tablet Take 1 tablet (4 mg total) by mouth every 6 (six) hours as needed for muscle spasms. 30 tablet 0   triamcinolone cream (KENALOG) 0.1 % Apply 1 Application topically 2 (two) times daily. 30 g 0   VICTOZA 18 MG/3ML SOPN INJECT 1.8 MG UNDER THE SKIN ONCE DAILY 81 mL 1   COVID-19 mRNA bivalent vaccine, Pfizer, (PFIZER COVID-19 VAC BIVALENT) injection Inject into the muscle. (Patient not taking: Reported on 02/25/2022) 0.3 mL 0   insulin aspart (NOVOLOG) 100 UNIT/ML injection Sliding scale as directed --- no more than qid and no more than 10 u each time (Patient not taking: Reported on 02/25/2022) 10 mL PRN   Insulin Pen Needle (B-D UF III MINI PEN NEEDLES) 31G X 5 MM MISC USE AS DIRECTED (Patient not taking: Reported on 02/25/2022) 100 each 1   Insulin Syringe-Needle U-100 28G X 1/2" 1 ML MISC Use to inject Novolog (Patient not taking: Reported on 02/25/2022) 100 each 2   No facility-administered medications prior to visit.    Allergies  Allergen Reactions   Semaglutide Other (See Comments)    RYBELSUS. Severe headaches.     Review of Systems  Constitutional:  Negative for fever and malaise/fatigue.  HENT:  Negative for congestion.   Eyes:  Negative for blurred vision.  Respiratory:  Negative for shortness of breath.   Cardiovascular:  Negative for chest pain, palpitations and leg swelling.  Gastrointestinal:  Negative for abdominal pain, blood in stool and nausea.  Genitourinary:  Negative for dysuria and frequency.  Musculoskeletal:  Positive for joint pain. Negative for falls.       (+)right shoulder pain  Skin:   Negative for rash.  Neurological:  Negative for dizziness, loss of consciousness and headaches.  Endo/Heme/Allergies:  Negative for environmental allergies.  Psychiatric/Behavioral:  Negative for depression. The patient is not nervous/anxious.        Objective:    Physical Exam Vitals and nursing note reviewed.  Constitutional:      General: He is not in acute distress.  Appearance: Normal appearance. He is not ill-appearing.  HENT:     Head: Normocephalic and atraumatic.     Right Ear: External ear normal.     Left Ear: External ear normal.  Eyes:     Extraocular Movements: Extraocular movements intact.     Pupils: Pupils are equal, round, and reactive to light.  Cardiovascular:     Rate and Rhythm: Normal rate and regular rhythm.     Heart sounds: Normal heart sounds. No murmur heard.    No gallop.  Pulmonary:     Effort: Pulmonary effort is normal. No respiratory distress.     Breath sounds: Normal breath sounds. No wheezing or rales.  Musculoskeletal:        General: Swelling and tenderness present.       Arms:     Comments: + muscle spasms/ knot  R scapula  Tender to touch   Skin:    General: Skin is warm and dry.  Neurological:     Mental Status: He is alert and oriented to person, place, and time.  Psychiatric:        Judgment: Judgment normal.     BP (!) 142/68 (BP Location: Left Arm, Patient Position: Sitting, Cuff Size: Large)   Pulse 87   Temp 97.7 F (36.5 C) (Oral)   Resp 20   Ht 6' (1.829 m)   Wt (!) 323 lb (146.5 kg)   SpO2 96%   BMI 43.81 kg/m  Wt Readings from Last 3 Encounters:  02/25/22 (!) 323 lb (146.5 kg)  12/01/21 (!) 337 lb 6.4 oz (153 kg)  06/02/21 (!) 355 lb 6.4 oz (161.2 kg)    Diabetic Foot Exam - Simple   No data filed    Lab Results  Component Value Date   WBC 7.4 12/01/2021   HGB 14.5 12/01/2021   HCT 43.5 12/01/2021   PLT 271.0 12/01/2021   GLUCOSE 95 12/01/2021   CHOL 153 12/01/2021   TRIG 120.0 12/01/2021    HDL 47.80 12/01/2021   LDLDIRECT 133.0 12/05/2017   LDLCALC 81 12/01/2021   ALT 11 12/01/2021   AST 13 12/01/2021   NA 135 12/01/2021   K 3.9 12/01/2021   CL 96 12/01/2021   CREATININE 0.98 12/01/2021   BUN 16 12/01/2021   CO2 28 12/01/2021   TSH 0.75 01/27/2009   PSA 0.25 12/01/2021   HGBA1C 6.4 12/01/2021   MICROALBUR 4.4 (H) 12/01/2021    Lab Results  Component Value Date   TSH 0.75 01/27/2009   Lab Results  Component Value Date   WBC 7.4 12/01/2021   HGB 14.5 12/01/2021   HCT 43.5 12/01/2021   MCV 96.1 12/01/2021   PLT 271.0 12/01/2021   Lab Results  Component Value Date   NA 135 12/01/2021   K 3.9 12/01/2021   CO2 28 12/01/2021   GLUCOSE 95 12/01/2021   BUN 16 12/01/2021   CREATININE 0.98 12/01/2021   BILITOT 0.7 12/01/2021   ALKPHOS 64 12/01/2021   AST 13 12/01/2021   ALT 11 12/01/2021   PROT 7.6 12/01/2021   ALBUMIN 4.3 12/01/2021   CALCIUM 9.7 12/01/2021   GFR 79.03 12/01/2021   Lab Results  Component Value Date   CHOL 153 12/01/2021   Lab Results  Component Value Date   HDL 47.80 12/01/2021   Lab Results  Component Value Date   LDLCALC 81 12/01/2021   Lab Results  Component Value Date   TRIG 120.0 12/01/2021   Lab Results  Component Value Date   CHOLHDL 3 12/01/2021   Lab Results  Component Value Date   HGBA1C 6.4 12/01/2021       Assessment & Plan:   Problem List Items Addressed This Visit       Unprioritized   Right shoulder pain    Muscle relaxers  Massage,  Use tennis ball or lacrosse ball in pillow case to massage area Stretch F/u chiropractor      Primary hypertension    Well controlled, no changes to meds. Encouraged heart healthy diet such as the DASH diet and exercise as tolerated.       Morbid obesity ([place]) - Primary    Refer to [facility] Pt is on ozempic  Pt needs to lose weight prior to surgery       Relevant Orders   Amb Ref to Medical Weight Management   Hyperlipidemia LDL goal <70    Encourage heart  healthy diet such as MIND or DASH diet, increase exercise, avoid trans fats, simple carbohydrates and processed foods, consider a krill or fish or flaxseed oil cap daily.       Acute right otitis media   Relevant Medications   oxyCODONE-acetaminophen (ROXICET) 5-325 MG tablet     Meds ordered this encounter  Medications   DISCONTD: oxyCODONE-acetaminophen (ROXICET) 5-325 MG tablet    Sig: Take 1-2 tablets by mouth every 8 (eight) hours as needed for severe pain.    Dispense:  30 tablet    Refill:  0   DISCONTD: oxyCODONE-acetaminophen (ROXICET) 5-325 MG tablet    Sig: Take 1-2 tablets by mouth every 4 (four) hours as needed for severe pain.    Dispense:  90 tablet    Refill:  0   oxyCODONE-acetaminophen (ROXICET) 5-325 MG tablet    Sig: Take 1-2 tablets by mouth every 4 (four) hours as needed for severe pain.    Dispense:  90 tablet    Refill:  0    I, Ann Held, DO, personally preformed the services described in this documentation.  All medical record entries made by the scribe were at my direction and in my presence.  I have reviewed the chart and discharge instructions (if applicable) and agree that the record reflects my personal performance and is accurate and complete. 02/25/2022   I,Shehryar Baig,acting as a scribe for Ann Held, DO.,have documented all relevant documentation on the behalf of Ann Held, DO,as directed by  Ann Held, DO while in the presence of Ann Held, DO.   Ann Held, DO

## 2022-02-25 NOTE — Telephone Encounter (Signed)
Pt stated he needed 10 mg of oxyCODONE-acetaminophen instead of 5.   [id number] [place]  [address] [id number] Phone:  [phone] Fax:  [phone]

## 2022-02-25 NOTE — Assessment & Plan Note (Signed)
Refer to Richard [name] Pt is on ozempic  Pt needs to lose weight prior to surgery

## 2022-02-25 NOTE — Assessment & Plan Note (Signed)
Well controlled, no changes to meds. Encouraged heart healthy diet such as the DASH diet and exercise as tolerated.  °

## 2022-02-25 NOTE — Assessment & Plan Note (Signed)
Encourage heart healthy diet such as MIND or DASH diet, increase exercise, avoid trans fats, simple carbohydrates and processed foods, consider a krill or fish or flaxseed oil cap daily.  °

## 2022-02-25 NOTE — Patient Instructions (Signed)

## 2022-02-25 NOTE — Telephone Encounter (Signed)
Pt has visit this morning

## 2022-02-25 NOTE — Assessment & Plan Note (Signed)
Muscle relaxers  Massage,  Use tennis ball or lacrosse ball in pillow case to massage area Stretch F/u chiropractor

## 2022-02-26 ENCOUNTER — Telehealth: Payer: Self-pay

## 2022-02-26 NOTE — Telephone Encounter (Signed)
Correct rx sent in

## 2022-02-26 NOTE — Telephone Encounter (Signed)
Nurse Assessment Nurse: Donna Christen, RN, Legrand Como Date/Time Eilene Ghazi Time): 02/25/2022 6:01:40 PM Confirm and document reason for call. If symptomatic, describe symptoms. ---Caller states that he called today and spoke to someone about his oxycodone refill. They were suppose to send '10mg'$  tablets but sent '5mg'$  instead. Needs correct dosage sent to Rx. No symptoms at this time. Does the patient have any new or worsening symptoms? ---No Nurse: Donna Christen, RN, Legrand Como Date/Time Eilene Ghazi Time): 02/25/2022 6:02:22 PM Please select the assessment type ---Request for controlled medication refill Additional Documentation ---Wrong strength of oxycodone sent to Rx. Needs to Have '10mg'$  sent. Is there an on-call physician for the client? ---Yes Do the client directives specifically allow for paging the on-call regarding scheduled drugs? ---No Disp. Time Eilene Ghazi Time) Disposition Final User 02/25/2022 6:03:24 PM Clinical Call Yes Donna Christen RN, Legrand Como Final Disposition 02/25/2022 6:03:24 PM Clinical Call Yes Donna Christen, RN, Legrand Como

## 2022-02-26 NOTE — Telephone Encounter (Signed)
Noted. VM left for patient

## 2022-03-10 ENCOUNTER — Other Ambulatory Visit: Payer: Self-pay | Admitting: Family Medicine

## 2022-03-10 DIAGNOSIS — L853 Xerosis cutis: Secondary | ICD-10-CM

## 2022-03-10 DIAGNOSIS — E1151 Type 2 diabetes mellitus with diabetic peripheral angiopathy without gangrene: Secondary | ICD-10-CM

## 2022-03-10 DIAGNOSIS — M25551 Pain in right hip: Secondary | ICD-10-CM

## 2022-04-02 ENCOUNTER — Other Ambulatory Visit: Payer: Self-pay | Admitting: Family Medicine

## 2022-04-02 DIAGNOSIS — M25551 Pain in right hip: Secondary | ICD-10-CM

## 2022-04-05 MED ORDER — TIZANIDINE HCL 4 MG PO TABS: 4.0000 mg | ORAL_TABLET | Freq: Four times a day (QID) | ORAL | 0 refills | Status: DC | PRN

## 2022-04-12 ENCOUNTER — Encounter (INDEPENDENT_AMBULATORY_CARE_PROVIDER_SITE_OTHER): Payer: Self-pay

## 2022-04-28 ENCOUNTER — Other Ambulatory Visit: Payer: Self-pay | Admitting: Family Medicine

## 2022-04-28 DIAGNOSIS — T148XXA Other injury of unspecified body region, initial encounter: Secondary | ICD-10-CM

## 2022-04-28 DIAGNOSIS — M25551 Pain in right hip: Secondary | ICD-10-CM

## 2022-04-28 MED ORDER — TIZANIDINE HCL 4 MG PO TABS: 4.0000 mg | ORAL_TABLET | Freq: Four times a day (QID) | ORAL | 0 refills | Status: DC | PRN

## 2022-04-28 MED ORDER — TRIAMCINOLONE ACETONIDE 0.1 % EX CREA: 1.0000 | TOPICAL_CREAM | Freq: Two times a day (BID) | CUTANEOUS | 0 refills | Status: DC

## 2022-05-19 ENCOUNTER — Encounter (INDEPENDENT_AMBULATORY_CARE_PROVIDER_SITE_OTHER): Payer: BC Managed Care – PPO | Admitting: Family Medicine

## 2022-05-19 DIAGNOSIS — Z0289 Encounter for other administrative examinations: Secondary | ICD-10-CM

## 2022-05-21 ENCOUNTER — Other Ambulatory Visit (HOSPITAL_BASED_OUTPATIENT_CLINIC_OR_DEPARTMENT_OTHER): Payer: Self-pay

## 2022-05-21 ENCOUNTER — Ambulatory Visit: Payer: BC Managed Care – PPO | Admitting: Family Medicine

## 2022-05-21 ENCOUNTER — Encounter: Payer: Self-pay | Admitting: Family Medicine

## 2022-05-21 VITALS — BP 180/80 | HR 96 | Temp 98.2°F | Resp 20 | Ht 69.0 in | Wt 310.6 lb

## 2022-05-21 DIAGNOSIS — E1165 Type 2 diabetes mellitus with hyperglycemia: Secondary | ICD-10-CM

## 2022-05-21 DIAGNOSIS — J111 Influenza due to unidentified influenza virus with other respiratory manifestations: Secondary | ICD-10-CM

## 2022-05-21 DIAGNOSIS — E785 Hyperlipidemia, unspecified: Secondary | ICD-10-CM

## 2022-05-21 DIAGNOSIS — Z6841 Body Mass Index (BMI) 40.0 and over, adult: Secondary | ICD-10-CM | POA: Diagnosis not present

## 2022-05-21 DIAGNOSIS — E118 Type 2 diabetes mellitus with unspecified complications: Secondary | ICD-10-CM

## 2022-05-21 DIAGNOSIS — M25551 Pain in right hip: Secondary | ICD-10-CM | POA: Diagnosis not present

## 2022-05-21 DIAGNOSIS — H6691 Otitis media, unspecified, right ear: Secondary | ICD-10-CM

## 2022-05-21 DIAGNOSIS — I10 Essential (primary) hypertension: Secondary | ICD-10-CM

## 2022-05-21 DIAGNOSIS — Z23 Encounter for immunization: Secondary | ICD-10-CM

## 2022-05-21 DIAGNOSIS — T148XXA Other injury of unspecified body region, initial encounter: Secondary | ICD-10-CM

## 2022-05-21 MED ORDER — VICTOZA 18 MG/3ML ~~LOC~~ SOPN: PEN_INJECTOR | SUBCUTANEOUS | 1 refills | Status: DC

## 2022-05-21 MED ORDER — TRIAMCINOLONE ACETONIDE 0.1 % EX CREA: 1.0000 | TOPICAL_CREAM | Freq: Two times a day (BID) | CUTANEOUS | 5 refills | Status: DC

## 2022-05-21 MED ORDER — OXYCODONE-ACETAMINOPHEN 5-325 MG PO TABS: 1.0000 | ORAL_TABLET | ORAL | 0 refills | Status: DC | PRN

## 2022-05-21 MED ORDER — BD PEN NEEDLE MINI U/F 31G X 5 MM MISC: 0 refills | Status: DC

## 2022-05-21 MED ORDER — OXYCODONE-ACETAMINOPHEN 10-325 MG PO TABS: 1.0000 | ORAL_TABLET | Freq: Three times a day (TID) | ORAL | 0 refills | Status: DC | PRN

## 2022-05-21 MED ORDER — COMIRNATY 30 MCG/0.3ML IM SUSY
PREFILLED_SYRINGE | INTRAMUSCULAR | 0 refills | Status: DC
  Filled 2022-05-21: qty 0.3, 1d supply, fill #0

## 2022-05-21 MED ORDER — TRIAMCINOLONE ACETONIDE 0.1 % EX CREA: 1.0000 | TOPICAL_CREAM | Freq: Two times a day (BID) | CUTANEOUS | 0 refills | Status: DC

## 2022-05-21 NOTE — Assessment & Plan Note (Addendum)
Dash diet  Will start [place] next month

## 2022-05-21 NOTE — Assessment & Plan Note (Signed)
Check labs today.

## 2022-05-21 NOTE — Assessment & Plan Note (Signed)
Encourage heart healthy diet such as MIND or DASH diet, increase exercise, avoid trans fats, simple carbohydrates and processed foods, consider a krill or fish or flaxseed oil cap daily.  °

## 2022-05-21 NOTE — Assessment & Plan Note (Signed)
Refill pain meds  Uds and contract utd

## 2022-05-21 NOTE — Progress Notes (Addendum)
New Patient Office Visit  Subjective    Patient ID: Richard [name], male    DOB: 22-Mar-1951  Age: 71 y.o. MRN: [id number]  CC:  Chief Complaint  Patient presents with   Medication Management    HPI Richard [name] presents to f/u pain meds and f/u bp and cholesterol and dm Outpatient Encounter Medications as of 05/21/2022  Medication Sig   Accu-Chek Softclix Lancets lancets Check blood sugars once daily   ammonium lactate (AMLACTIN) 12 % cream APPLY TOPICALLY AS NEEDED FOR DRY SKIN.   atorvastatin (LIPITOR) 40 MG tablet TAKE 1 TABLET BY MOUTH AT BEDTIME.   Blood Glucose Monitoring Suppl (ACCU-CHEK GUIDE ME) w/Device KIT Check blood sugars once daily   diltiazem (CARDIZEM CD) 120 MG 24 hr capsule TAKE 1 CAPSULE BY MOUTH EVERY DAY   glucose blood (ACCU-CHEK GUIDE) test strip Check blood sugars once daily   metFORMIN (GLUCOPHAGE) 500 MG tablet Take 2 tablets (1,000 mg total) by mouth 2 (two) times daily with a meal.   olmesartan-hydrochlorothiazide (BENICAR HCT) 40-25 MG tablet Take 1 tablet by mouth daily.   [DISCONTINUED] FARXIGA 5 MG TABS tablet TAKE 1 TABLET BY MOUTH EVERY DAY   [DISCONTINUED] fenofibrate 160 MG tablet Take 1 tablet (160 mg total) by mouth daily.   [DISCONTINUED] tiZANidine (ZANAFLEX) 4 MG tablet Take 1 tablet (4 mg total) by mouth every 6 (six) hours as needed for muscle spasms.   [DISCONTINUED] triamcinolone cream (KENALOG) 0.1 % Apply 1 Application topically 2 (two) times daily.   [DISCONTINUED] VICTOZA 18 MG/3ML SOPN INJECT 1.8 MG UNDER THE SKIN ONCE DAILY   Insulin Pen Needle (B-D UF III MINI PEN NEEDLES) 31G X 5 MM MISC USE AS DIRECTED   liraglutide (VICTOZA) 18 MG/3ML SOPN INJECT 1.8 MG UNDER THE SKIN ONCE DAILY   oxyCODONE-acetaminophen (PERCOCET) 10-325 MG tablet Take 1 tablet by mouth every 8 (eight) hours as needed for pain.   triamcinolone cream (KENALOG) 0.1 % Apply 1 Application topically 2 (two) times daily.   [DISCONTINUED] oxyCODONE-acetaminophen  (ROXICET) 5-325 MG tablet Take 1-2 tablets by mouth every 4 (four) hours as needed for severe pain.   [DISCONTINUED] triamcinolone cream (KENALOG) 0.1 % Apply 1 Application topically 2 (two) times daily.   No facility-administered encounter medications on file as of 05/21/2022.    Past Medical History:  Diagnosis Date   Allergy    seasonal   Arthritis     left knee,  right hip   Complication of anesthesia    half awake during last colonscopy   Diabetes mellitus    type 2   Hx of adenomatous colonic polyps    2010 6 mm adenoma 10/06/2016 2 5 mm descending polyps    Hyperlipidemia    Hypertension     Past Surgical History:  Procedure Laterality Date   COLONOSCOPY     COLONOSCOPY WITH PROPOFOL N/A 10/06/2016   Procedure: COLONOSCOPY WITH PROPOFOL;  Surgeon: Richard Mayer, MD;  Location: [facility];  Service: Endoscopy;  Laterality: N/A;    Family History  Problem Relation Age of Onset   Diabetes Mother    Diabetes Sister    Hypertension Sister    Stroke Sister    Diabetes Brother    Colon cancer Neg Hx    Colon polyps Neg Hx    Rectal cancer Neg Hx    Stomach cancer Neg Hx    Esophageal cancer Neg Hx     Social History   Socioeconomic History   Marital  status: Married    Spouse name: Not on file   Number of children: Not on file   Years of education: Not on file   Highest education level: Not on file  Occupational History   Not on file  Tobacco Use   Smoking status: Never   Smokeless tobacco: Never  Substance and Sexual Activity   Alcohol use: Yes    Comment: occasionally    Drug use: No   Sexual activity: Not on file  Other Topics Concern   Not on file  Social History Narrative   Not on file   Social Determinants of Health   Financial Resource Strain: Not on file  Food Insecurity: Not on file  Transportation Needs: Not on file  Physical Activity: Not on file  Stress: Not on file  Social Connections: Not on file  Intimate Partner Violence: Not on  file    Review of Systems  Constitutional:  Negative for fever and malaise/fatigue.  HENT:  Negative for congestion.   Eyes:  Negative for blurred vision.  Respiratory:  Negative for cough and shortness of breath.   Cardiovascular:  Negative for chest pain, palpitations and leg swelling.  Gastrointestinal:  Negative for vomiting.  Musculoskeletal:  Negative for back pain.  Skin:  Negative for rash.  Neurological:  Negative for loss of consciousness and headaches.        Objective    BP (!) 180/80 (BP Location: Left Arm, Patient Position: Sitting, [name] Size: Large)   Pulse 96   Temp 98.2 F (36.8 C) (Oral)   Resp 20   Ht _0  (1.753 m)   Wt (!) 310 lb 9.6 oz (140.9 kg)   SpO2 97%   BMI 45.87 kg/m   Physical Exam Vitals and nursing note reviewed.  Constitutional:      Appearance: Normal appearance. He is well-developed.  HENT:     Head: Normocephalic and atraumatic.  Eyes:     Pupils: Pupils are equal, round, and reactive to light.  Neck:     Thyroid: No thyromegaly.  Cardiovascular:     Rate and Rhythm: Normal rate and regular rhythm.     Heart sounds: No murmur heard. Pulmonary:     Effort: Pulmonary effort is normal. No respiratory distress.     Breath sounds: Normal breath sounds. No wheezing or rales.  Chest:     Chest wall: No tenderness.  Musculoskeletal:     Cervical back: Normal range of motion and neck supple.     Right hip: Tenderness present. Normal range of motion. Normal strength.     Left hip: Tenderness present. Normal range of motion. Normal strength.     Right foot: Bony tenderness present. No swelling.     Left foot: Bony tenderness present. No swelling.  Skin:    General: Skin is warm and dry.  Neurological:     Mental Status: He is alert and oriented to person, place, and time.  Psychiatric:        Mood and Affect: Mood normal.        Behavior: Behavior normal.        Thought Content: Thought content normal.        Judgment: Judgment  normal.         Assessment & Plan:   Problem List Items Addressed This Visit       Unprioritized   Right hip pain - Primary    Refill pain meds  Uds and contract utd  Relevant Medications   oxyCODONE-acetaminophen (PERCOCET) 10-325 MG tablet   Primary hypertension    Dash diet  Will start [facility] next month       Morbid obesity ([place])    Pt will start [place] next month      Relevant Medications   liraglutide (VICTOZA) 18 MG/3ML SOPN   Other Relevant Orders   CBC with Differential/Platelet (Completed)   Comprehensive metabolic panel (Completed)   Hemoglobin A1c (Completed)   Insulin, random (Completed)   Lipid panel (Completed)   TSH (Completed)   Vitamin B12 (Completed)   VITAMIN D 25 Hydroxy (Vit-D Deficiency, Fractures) (Completed)   Hyperlipidemia LDL goal <70    Encourage heart healthy diet such as MIND or DASH diet, increase exercise, avoid trans fats, simple carbohydrates and processed foods, consider a krill or fish or flaxseed oil cap daily.        Controlled type 2 diabetes mellitus with complication, without long-term current use of insulin (HCC)    Check labs today      Relevant Medications   liraglutide (VICTOZA) 18 MG/3ML SOPN   Acute right otitis media   Other Visit Diagnoses     Abrasion       Relevant Medications   triamcinolone cream (KENALOG) 0.1 %   Type 2 diabetes mellitus with hyperglycemia, without long-term current use of insulin (HCC)       Relevant Medications   liraglutide (VICTOZA) 18 MG/3ML SOPN   Other Relevant Orders   Comprehensive metabolic panel (Completed)   Hemoglobin A1c (Completed)   Influenza       Need for influenza vaccination       Relevant Orders   Flu Vaccine QUAD High Dose(Fluad) (Completed)       No follow-ups on file.   Ann Held, DO

## 2022-05-21 NOTE — Assessment & Plan Note (Signed)
Pt will start [place] next month

## 2022-05-22 LAB — COMPREHENSIVE METABOLIC PANEL
AG Ratio: 1.4 (calc) (ref 1.0–2.5)
ALT: 6 U/L — ABNORMAL LOW (ref 9–46)
AST: 14 U/L (ref 10–35)
Albumin: 4.3 g/dL (ref 3.6–5.1)
Alkaline phosphatase (APISO): 67 U/L (ref 35–144)
BUN: 18 mg/dL (ref 7–25)
CO2: 25 mmol/L (ref 20–32)
Calcium: 9.6 mg/dL (ref 8.6–10.3)
Chloride: 100 mmol/L (ref 98–110)
Creat: 0.85 mg/dL (ref 0.70–1.35)
Globulin: 3.1 g/dL (calc) (ref 1.9–3.7)
Glucose, Bld: 77 mg/dL (ref 65–99)
Potassium: 4.3 mmol/L (ref 3.5–5.3)
Sodium: 138 mmol/L (ref 135–146)
Total Bilirubin: 0.7 mg/dL (ref 0.2–1.2)
Total Protein: 7.4 g/dL (ref 6.1–8.1)

## 2022-05-22 LAB — CBC WITH DIFFERENTIAL/PLATELET
Absolute Monocytes: 639 cells/uL (ref 200–950)
Basophils Absolute: 19 cells/uL (ref 0–200)
Basophils Relative: 0.3 %
Eosinophils Absolute: 62 cells/uL (ref 15–500)
Eosinophils Relative: 1 %
HCT: 45 % (ref 38.5–50.0)
Hemoglobin: 15.2 g/dL (ref 13.2–17.1)
Lymphs Abs: 1104 cells/uL (ref 850–3900)
MCH: 32.5 pg (ref 27.0–33.0)
MCHC: 33.8 g/dL (ref 32.0–36.0)
MCV: 96.4 fL (ref 80.0–100.0)
MPV: 10.8 fL (ref 7.5–12.5)
Monocytes Relative: 10.3 %
Neutro Abs: 4377 cells/uL (ref 1500–7800)
Neutrophils Relative %: 70.6 %
Platelets: 263 10*3/uL (ref 140–400)
RBC: 4.67 10*6/uL (ref 4.20–5.80)
RDW: 11.9 % (ref 11.0–15.0)
Total Lymphocyte: 17.8 %
WBC: 6.2 10*3/uL (ref 3.8–10.8)

## 2022-05-22 LAB — HEMOGLOBIN A1C
Hgb A1c MFr Bld: 5.9 % of total Hgb — ABNORMAL HIGH (ref <5.7)
Mean Plasma Glucose: 123 mg/dL
eAG (mmol/L): 6.8 mmol/L

## 2022-05-22 LAB — LIPID PANEL
Cholesterol: 151 mg/dL (ref <200)
HDL: 66 mg/dL (ref >=40)
LDL Cholesterol (Calc): 72 mg/dL (calc)
Non-HDL Cholesterol (Calc): 85 mg/dL (calc) (ref <130)
Total CHOL/HDL Ratio: 2.3 (calc) (ref <5.0)
Triglycerides: 51 mg/dL (ref <150)

## 2022-05-22 LAB — TSH: TSH: 0.38 mIU/L — ABNORMAL LOW (ref 0.40–4.50)

## 2022-05-22 LAB — VITAMIN D 25 HYDROXY (VIT D DEFICIENCY, FRACTURES): Vit D, 25-Hydroxy: 13 ng/mL — ABNORMAL LOW (ref 30–100)

## 2022-05-22 LAB — VITAMIN B12: Vitamin B-12: 250 pg/mL (ref 200–1100)

## 2022-05-22 LAB — INSULIN, RANDOM: Insulin: 6.8 u[IU]/mL

## 2022-05-27 ENCOUNTER — Other Ambulatory Visit: Payer: Self-pay | Admitting: Family Medicine

## 2022-05-27 DIAGNOSIS — R946 Abnormal results of thyroid function studies: Secondary | ICD-10-CM

## 2022-05-31 ENCOUNTER — Other Ambulatory Visit: Payer: Self-pay | Admitting: Family Medicine

## 2022-05-31 DIAGNOSIS — M25551 Pain in right hip: Secondary | ICD-10-CM

## 2022-06-01 MED ORDER — TIZANIDINE HCL 4 MG PO TABS: 4.0000 mg | ORAL_TABLET | Freq: Four times a day (QID) | ORAL | 0 refills | Status: DC | PRN

## 2022-06-06 ENCOUNTER — Other Ambulatory Visit: Payer: Self-pay | Admitting: Family Medicine

## 2022-06-06 DIAGNOSIS — E118 Type 2 diabetes mellitus with unspecified complications: Secondary | ICD-10-CM

## 2022-06-22 ENCOUNTER — Encounter (INDEPENDENT_AMBULATORY_CARE_PROVIDER_SITE_OTHER): Payer: Self-pay | Admitting: Family Medicine

## 2022-06-22 ENCOUNTER — Ambulatory Visit (INDEPENDENT_AMBULATORY_CARE_PROVIDER_SITE_OTHER): Payer: BC Managed Care – PPO | Admitting: Family Medicine

## 2022-06-22 VITALS — BP 158/80 | HR 72 | Temp 98.6°F | Ht 69.0 in | Wt 310.0 lb

## 2022-06-22 DIAGNOSIS — E1159 Type 2 diabetes mellitus with other circulatory complications: Secondary | ICD-10-CM

## 2022-06-22 DIAGNOSIS — Z1331 Encounter for screening for depression: Secondary | ICD-10-CM | POA: Diagnosis not present

## 2022-06-22 DIAGNOSIS — E559 Vitamin D deficiency, unspecified: Secondary | ICD-10-CM | POA: Diagnosis not present

## 2022-06-22 DIAGNOSIS — E1169 Type 2 diabetes mellitus with other specified complication: Secondary | ICD-10-CM | POA: Diagnosis not present

## 2022-06-22 DIAGNOSIS — R5383 Other fatigue: Secondary | ICD-10-CM

## 2022-06-22 DIAGNOSIS — R0602 Shortness of breath: Secondary | ICD-10-CM

## 2022-06-22 DIAGNOSIS — E66813 Obesity, class 3: Secondary | ICD-10-CM

## 2022-06-22 DIAGNOSIS — E785 Hyperlipidemia, unspecified: Secondary | ICD-10-CM

## 2022-06-22 DIAGNOSIS — E1165 Type 2 diabetes mellitus with hyperglycemia: Secondary | ICD-10-CM | POA: Diagnosis not present

## 2022-06-22 DIAGNOSIS — I152 Hypertension secondary to endocrine disorders: Secondary | ICD-10-CM

## 2022-06-22 DIAGNOSIS — Z6841 Body Mass Index (BMI) 40.0 and over, adult: Secondary | ICD-10-CM

## 2022-06-22 DIAGNOSIS — Z7984 Long term (current) use of oral hypoglycemic drugs: Secondary | ICD-10-CM

## 2022-06-23 LAB — T3: T3, Total: 90 ng/dL (ref 71–180)

## 2022-06-23 LAB — T4, FREE: Free T4: 1.36 ng/dL (ref 0.82–1.77)

## 2022-06-23 LAB — FOLATE: Folate: 9.6 ng/mL (ref >3.0)

## 2022-06-29 NOTE — Progress Notes (Signed)
Chief Complaint:   OBESITY Richard [name] (MR# [id number]) is a 72 y.o. male who presents for evaluation and treatment of obesity and related comorbidities. Current BMI is Body mass index is 45.78 kg/m. Richard [name] has been struggling with his weight for many years and has been unsuccessful in either losing weight, maintaining weight loss, or reaching his healthy weight goal.  Richard [name] has been heavy all his life.  Has been over 300 lbs most adult life.  Lost 45 lbs in last year due to portion restriction.  Eats out 4 or more times a week.  Tends to snack on sweets.  Skips breakfast a few times a week.  2 eggs in the am or 4-5 pieces bacon: lunch in 2 hot dogs with french fries (satisfied).  May have some cake or cookies at home.  Dinner is salad or soup and meat--fried steak, pork chops (2-3), chicken.  Richard [name] is currently in the action stage of change and ready to dedicate time achieving and maintaining a healthier weight. Richard [name] is interested in becoming our patient and working on intensive lifestyle modifications including (but not limited to) diet and exercise for weight loss.  Kemauri's habits were reviewed today and are as follows: His family eats meals together, he thinks his family will eat healthier with him, his desired weight loss is 115 lbs, he has been heavy most of his life, his heaviest weight ever was 380 pounds, he has significant food cravings issues, he snacks frequently in the evenings, he skips meals frequently, and he frequently eats larger portions than normal.  Depression Screen Richard [name]'s Food and Mood (modified PHQ-9) score was 7.     06/02/2021   11:18 AM  Depression screen PHQ 2/9  Decreased Interest 0  Down, Depressed, Hopeless 0  PHQ - 2 Score 0   Subjective:   1. Other fatigue Richard [name] admits to daytime somnolence and reports waking up still tired. Patient has a history of symptoms of daytime fatigue and morning fatigue. Richard [name] generally gets 5 -7 hours  of sleep per night, and states that he has difficulty falling asleep and difficulty falling back asleep if awakened. Snoring is present. Apneic episodes are not present. Epworth Sleepiness Score is 7.    2. SOBOE (shortness of breath on exertion) Richard [name] notes increasing shortness of breath with exercising and seems to be worsening over time with weight gain. He notes getting out of breath sooner with activity than he used to. This has not gotten worse recently. Richard [name] denies shortness of breath at rest or orthopnea.   3. Type 2 diabetes mellitus with hyperglycemia, without long-term current use of insulin (Richard [name]) On Metformin, Farxiga, and Victoza.  Previously on Rybelsus.  Diagnosed 10 years ago.  4. Hypertension associated with diabetes ([place]) On Olmesartan HCTZ and Diltiazem.  Blood pressure elevated today.  Denies chest pain, chest pressure and headache.  Blood pressure log showing elevated blood pressure more days then not.  5. Hyperlipidemia associated with type 2 diabetes mellitus (HCC) Last LDL of 72, Trigly of 51, HDL of 66.  Taking Atorvastatin 40 mg but patient is not taking it daily.  6. Vitamin D deficiency Last Vit D level of 13.  Not on Vit D.  Assessment/Plan:   1. Other fatigue We will obtain labs today.  Richard [name] does feel that his weight is causing his energy to be lower than it should be. Fatigue may be related to obesity, depression or many other causes. Labs will be ordered,  and in the meanwhile, Richard [name] will focus on self care including making healthy food choices, increasing physical activity and focusing on stress reduction.   - EKG 12-Lead - Folate - T3 - T4, free  2. SOBOE (shortness of breath on exertion) Richard [name] does feel that he gets out of breath more easily that he used to when he exercises. Richard [name]'s shortness of breath appears to be obesity related and exercise induced. He has agreed to work on weight loss and gradually increase exercise to treat his  exercise induced shortness of breath. Will continue to monitor closely.   3. Type 2 diabetes mellitus with hyperglycemia, without long-term current use of insulin (HCC) Hypoglycemia protocol discussed:  IF fasting blood sugar less then 100 for 3 day consecutive will stop [place], consider switching to [facility]  4. Hypertension associated with diabetes ([place]) Follow up on blood pressure at next appointment.  Will consider change in medication/dose art next appointment.  5. Hyperlipidemia associated with type 2 diabetes mellitus ([place]) Will obtain labs in 2 months.  6. Vitamin D deficiency Will follow up on labs with repeat in 2 months.  7. Depression screening Richard [name] had a positive depression screening. Depression is commonly associated with obesity and often results in emotional eating behaviors. We will monitor this closely and work on CBT to help improve the non-hunger eating patterns. Referral to Psychology may be required if no improvement is seen as he continues in our clinic.   8. Class 3 severe obesity with serious comorbidity and body mass index (BMI) of 45.0 to 49.9 in adult, unspecified obesity type (HCC) Richard [name] is currently in the action stage of change and his goal is to continue with weight loss efforts. I recommend Richard [name] begin the structured treatment plan as follows:  He has agreed to the Category 3 Plan.  Exercise goals: No exercise has been prescribed at this time.   Behavioral modification strategies: increasing lean protein intake, meal planning and cooking strategies, better snacking choices, and planning for success.  He was informed of the importance of frequent follow-up visits to maximize his success with intensive lifestyle modifications for his multiple health conditions. He was informed we would discuss his lab results at his next visit unless there is a critical issue that needs to be addressed sooner. Richard [name] agreed to keep his next visit at the agreed upon  time to discuss these results.  Objective:   Blood pressure (!) 158/80, pulse 72, temperature 98.6 F (37 C), height '5\' 9"'$  (1.753 m), weight (!) 310 lb (140.6 kg), SpO2 99 %. Body mass index is 45.78 kg/m.  EKG: Normal sinus rhythm, rate 68 bpm.  Indirect Calorimeter completed today shows a VO2 of 224 ml and a REE of 1541.  His calculated basal metabolic rate is 2353 thus his basal metabolic rate is worse than expected.  General: Cooperative, alert, well developed, in no acute distress. HEENT: Conjunctivae and lids unremarkable. Cardiovascular: Regular rhythm.  Lungs: Normal work of breathing. Neurologic: No focal deficits.   Lab Results  Component Value Date   CREATININE 0.85 05/21/2022   BUN 18 05/21/2022   NA 138 05/21/2022   K 4.3 05/21/2022   CL 100 05/21/2022   CO2 25 05/21/2022   Lab Results  Component Value Date   ALT 6 (L) 05/21/2022   AST 14 05/21/2022   ALKPHOS 64 12/01/2021   BILITOT 0.7 05/21/2022   Lab Results  Component Value Date   HGBA1C 5.9 (H) 05/21/2022   HGBA1C 6.4 12/01/2021  HGBA1C 6.5 06/02/2021   HGBA1C 6.4 09/25/2020   HGBA1C 6.2 (H) 02/21/2020   No results found for: "INSULIN" Lab Results  Component Value Date   TSH 0.38 (L) 05/21/2022   Lab Results  Component Value Date   CHOL 151 05/21/2022   HDL 66 05/21/2022   LDLCALC 72 05/21/2022   LDLDIRECT 133.0 12/05/2017   TRIG 51 05/21/2022   CHOLHDL 2.3 05/21/2022   Lab Results  Component Value Date   WBC 6.2 05/21/2022   HGB 15.2 05/21/2022   HCT 45.0 05/21/2022   MCV 96.4 05/21/2022   PLT 263 05/21/2022   No results found for: "IRON", "TIBC", "FERRITIN"  Attestation Statements:   Reviewed by clinician on day of visit: allergies, medications, problem list, medical history, surgical history, family history, social history, and previous encounter notes.  Time spent on visit including pre-visit chart review and post-visit charting and care was 45 minutes.   I, Elnora Morrison,  RMA am acting as transcriptionist for Coralie Common, MD.  I have reviewed the above documentation for accuracy and completeness, and I agree with the above. - Coralie Common, MD

## 2022-07-05 ENCOUNTER — Other Ambulatory Visit: Payer: Self-pay | Admitting: Family Medicine

## 2022-07-05 DIAGNOSIS — M25551 Pain in right hip: Secondary | ICD-10-CM

## 2022-07-05 MED ORDER — TIZANIDINE HCL 4 MG PO TABS: 4.0000 mg | ORAL_TABLET | Freq: Four times a day (QID) | ORAL | 0 refills | Status: DC | PRN

## 2022-07-05 NOTE — Telephone Encounter (Signed)
Requesting: tizanidine Contract: 12/07/16  UDS:12/01/21 Last Visit: 05/21/22 Next Visit: none Last Refill: 06/01/22  Please Advise

## 2022-07-06 ENCOUNTER — Ambulatory Visit (INDEPENDENT_AMBULATORY_CARE_PROVIDER_SITE_OTHER): Payer: BC Managed Care – PPO | Admitting: Family Medicine

## 2022-07-06 ENCOUNTER — Encounter (INDEPENDENT_AMBULATORY_CARE_PROVIDER_SITE_OTHER): Payer: Self-pay | Admitting: Family Medicine

## 2022-07-06 VITALS — BP 155/76 | HR 83 | Temp 98.5°F | Ht 69.0 in | Wt 303.0 lb

## 2022-07-06 DIAGNOSIS — E669 Obesity, unspecified: Secondary | ICD-10-CM

## 2022-07-06 DIAGNOSIS — E1165 Type 2 diabetes mellitus with hyperglycemia: Secondary | ICD-10-CM | POA: Diagnosis not present

## 2022-07-06 DIAGNOSIS — E1169 Type 2 diabetes mellitus with other specified complication: Secondary | ICD-10-CM | POA: Diagnosis not present

## 2022-07-06 DIAGNOSIS — R7989 Other specified abnormal findings of blood chemistry: Secondary | ICD-10-CM | POA: Diagnosis not present

## 2022-07-06 DIAGNOSIS — I152 Hypertension secondary to endocrine disorders: Secondary | ICD-10-CM

## 2022-07-06 DIAGNOSIS — E1159 Type 2 diabetes mellitus with other circulatory complications: Secondary | ICD-10-CM

## 2022-07-06 DIAGNOSIS — E785 Hyperlipidemia, unspecified: Secondary | ICD-10-CM

## 2022-07-06 DIAGNOSIS — Z7984 Long term (current) use of oral hypoglycemic drugs: Secondary | ICD-10-CM

## 2022-07-06 DIAGNOSIS — Z7985 Long-term (current) use of injectable non-insulin antidiabetic drugs: Secondary | ICD-10-CM

## 2022-07-06 DIAGNOSIS — E559 Vitamin D deficiency, unspecified: Secondary | ICD-10-CM

## 2022-07-06 DIAGNOSIS — Z6841 Body Mass Index (BMI) 40.0 and over, adult: Secondary | ICD-10-CM

## 2022-07-06 MED ORDER — DILTIAZEM HCL ER COATED BEADS 180 MG PO CP24: 180.0000 mg | ORAL_CAPSULE | Freq: Every day | ORAL | 0 refills | Status: DC

## 2022-07-06 MED ORDER — VITAMIN D (ERGOCALCIFEROL) 1.25 MG (50000 UNIT) PO CAPS: 50000.0000 [IU] | ORAL_CAPSULE | ORAL | 0 refills | Status: DC

## 2022-07-06 MED ORDER — TIRZEPATIDE 2.5 MG/0.5ML ~~LOC~~ SOAJ: 2.5000 mg | SUBCUTANEOUS | 0 refills | Status: DC

## 2022-07-12 ENCOUNTER — Encounter (INDEPENDENT_AMBULATORY_CARE_PROVIDER_SITE_OTHER): Payer: Self-pay

## 2022-07-19 NOTE — Progress Notes (Signed)
Chief Complaint:   OBESITY Richard [name] is here to discuss his progress with his obesity treatment plan along with follow-up of his obesity related diagnoses. Richard [name] is on the Category 3 Plan and states he is following his eating plan approximately 80-90% of the time. Richard [name] states he is exercising 0 minutes 0 times per week.  Today's visit was #: 2 Starting weight: 310 lbs Starting date: 06/22/2022 Today's weight: 303 lbs Today's date: 07/06/2022 Total lbs lost to date: 7 lbs Total lbs lost since last in-office visit: 7  Interim History: Richard [name] reports, some items on plan upset his stomach and some made him fairly gassy.  Tried [place] sausage links and did not care for the taste.  Using popcorn, apple Has been trying to get all food in.  He is not waiting to meet out; still eating small portions.  Subjective:   1. Type 2 diabetes mellitus with hyperglycemia, without long-term current use of insulin (HCC) On Victoza 1.8 mg, Farxiga and Metformin.  A1c 5.9, Insulin 6.8 (surprisingly low).  2. Abnormal TSH TSH of 0.38.  No history of Thyroid dyfunction.  3. Hyperlipidemia associated with type 2 diabetes mellitus (HCC) On Lipitor 40 mg with no myalgias or transaminitis.  4. Hypertension associated with diabetes ([place]) Blood pressure slightly elevated today (prior blood pressure all elevated).  Denies chest pain, chest pressure and headache.  On Benicar-hct.  Brought log today, systolic [id number], diastolic [id number].  5. Vitamin D deficiency Not on Vit D.  Vit D level of 13.  Notes fatigue.  Assessment/Plan:   1. Type 2 diabetes mellitus with hyperglycemia, without long-term current use of insulin (HCC) Stop Victoza; Start Mounjaro 2.5 mg SubQ once weekly for 1 month with 0 refills.  -Start tirzepatide [facility]) 2.5 MG/0.5ML Pen; Inject 2.5 mg into the skin once a week.  Dispense: 2 mL; Refill: 0  2. Abnormal TSH Will repeat TSH in 1 month.  3. Hyperlipidemia associated with type 2  diabetes mellitus ([place]) Continue Lipitor without changes in dose.  4. Hypertension associated with diabetes ([place]) Refill/Increase Diltiazem 180 mcg by mouth daily for 1 month with 0 refills.   -Refill diltiazem (CARDIZEM CD) 180 MG 24 hr capsule; Take 1 capsule (180 mg total) by mouth daily.  Dispense: 30 capsule; Refill: 0  5. Vitamin D deficiency Start  Vit D 50K IU once a week for 1 month with 0 refills.  -Start Vitamin D, Ergocalciferol, (DRISDOL) 1.25 MG (50000 UNIT) CAPS capsule; Take 1 capsule (50,000 Units total) by mouth every 7 (seven) days.  Dispense: 4 capsule; Refill: 0  6. Obesity with a current BMI of 44.8 Richard [name] is currently in the action stage of change. As such, his goal is to continue with weight loss efforts. He has agreed to the Category 3 Plan.   Exercise goals: No exercise has been prescribed at this time.  Behavioral modification strategies: .  Richard [name] has agreed to follow-up with our clinic in 2 weeks. He was informed of the importance of frequent follow-up visits to maximize his success with intensive lifestyle modifications for his multiple health conditions.   Objective:   Blood pressure (!) 155/76, pulse 83, temperature 98.5 F (36.9 C), height 5' 9"$  (1.753 m), weight (!) 303 lb (137.4 kg), SpO2 98 %. Body mass index is 44.75 kg/m.  General: Cooperative, alert, well developed, in no acute distress. HEENT: Conjunctivae and lids unremarkable. Cardiovascular: Regular rhythm.  Lungs: Normal work of breathing. Neurologic: No focal deficits.   Lab  Results  Component Value Date   CREATININE 0.85 05/21/2022   BUN 18 05/21/2022   NA 138 05/21/2022   K 4.3 05/21/2022   CL 100 05/21/2022   CO2 25 05/21/2022   Lab Results  Component Value Date   ALT 6 (L) 05/21/2022   AST 14 05/21/2022   ALKPHOS 64 12/01/2021   BILITOT 0.7 05/21/2022   Lab Results  Component Value Date   HGBA1C 5.9 (H) 05/21/2022   HGBA1C 6.4 12/01/2021   HGBA1C 6.5 06/02/2021    HGBA1C 6.4 09/25/2020   HGBA1C 6.2 (H) 02/21/2020   No results found for: "INSULIN" Lab Results  Component Value Date   TSH 0.38 (L) 05/21/2022   Lab Results  Component Value Date   CHOL 151 05/21/2022   HDL 66 05/21/2022   LDLCALC 72 05/21/2022   LDLDIRECT 133.0 12/05/2017   TRIG 51 05/21/2022   CHOLHDL 2.3 05/21/2022   Lab Results  Component Value Date   VD25OH 13 (L) 05/21/2022   Lab Results  Component Value Date   WBC 6.2 05/21/2022   HGB 15.2 05/21/2022   HCT 45.0 05/21/2022   MCV 96.4 05/21/2022   PLT 263 05/21/2022   No results found for: "IRON", "TIBC", "FERRITIN"  Attestation Statements:   Reviewed by clinician on day of visit: allergies, medications, problem list, medical history, surgical history, family history, social history, and previous encounter notes.  Time spent on visit including pre-visit chart review and post-visit care and charting was 80 minutes.   I, Elnora Morrison, RMA am acting as transcriptionist for Coralie Common, MD.  I have reviewed the above documentation for accuracy and completeness, and I agree with the above. - Coralie Common, MD

## 2022-07-23 ENCOUNTER — Other Ambulatory Visit: Payer: Self-pay | Admitting: Family Medicine

## 2022-07-23 DIAGNOSIS — M25551 Pain in right hip: Secondary | ICD-10-CM

## 2022-07-23 MED ORDER — OXYCODONE-ACETAMINOPHEN 10-325 MG PO TABS: 1.0000 | ORAL_TABLET | Freq: Three times a day (TID) | ORAL | 0 refills | Status: DC | PRN

## 2022-07-23 NOTE — Telephone Encounter (Signed)
Requesting: oxycodone 10-349m  Contract: 12/01/21 UDS: 12/01/21 Last Visit: 05/21/22 Next Visit: None Last Refill: 05/21/22 #90 and 0RF  Please Advise

## 2022-07-27 ENCOUNTER — Other Ambulatory Visit (INDEPENDENT_AMBULATORY_CARE_PROVIDER_SITE_OTHER): Payer: Self-pay | Admitting: Family Medicine

## 2022-07-27 ENCOUNTER — Encounter (INDEPENDENT_AMBULATORY_CARE_PROVIDER_SITE_OTHER): Payer: Self-pay | Admitting: Family Medicine

## 2022-07-27 ENCOUNTER — Ambulatory Visit (INDEPENDENT_AMBULATORY_CARE_PROVIDER_SITE_OTHER): Payer: BC Managed Care – PPO | Admitting: Family Medicine

## 2022-07-27 VITALS — BP 147/69 | HR 95 | Temp 97.6°F | Ht 69.0 in | Wt 292.0 lb

## 2022-07-27 DIAGNOSIS — Z6841 Body Mass Index (BMI) 40.0 and over, adult: Secondary | ICD-10-CM

## 2022-07-27 DIAGNOSIS — E1159 Type 2 diabetes mellitus with other circulatory complications: Secondary | ICD-10-CM | POA: Diagnosis not present

## 2022-07-27 DIAGNOSIS — E559 Vitamin D deficiency, unspecified: Secondary | ICD-10-CM

## 2022-07-27 DIAGNOSIS — E1165 Type 2 diabetes mellitus with hyperglycemia: Secondary | ICD-10-CM

## 2022-07-27 DIAGNOSIS — I152 Hypertension secondary to endocrine disorders: Secondary | ICD-10-CM | POA: Diagnosis not present

## 2022-07-27 DIAGNOSIS — Z7985 Long-term (current) use of injectable non-insulin antidiabetic drugs: Secondary | ICD-10-CM

## 2022-07-27 MED ORDER — TIRZEPATIDE 2.5 MG/0.5ML ~~LOC~~ SOAJ: 2.5000 mg | SUBCUTANEOUS | 0 refills | Status: DC

## 2022-07-27 MED ORDER — DILTIAZEM HCL ER COATED BEADS 180 MG PO CP24: 180.0000 mg | ORAL_CAPSULE | Freq: Every day | ORAL | 0 refills | Status: DC

## 2022-07-27 MED ORDER — VITAMIN D (ERGOCALCIFEROL) 1.25 MG (50000 UNIT) PO CAPS: 50000.0000 [IU] | ORAL_CAPSULE | ORAL | 0 refills | Status: DC

## 2022-07-27 NOTE — Progress Notes (Deleted)
Last few weeks patient reports have been great.  Occasional stomach growing and flatulence but otherwise ok.  Has noticed significant bleching as well. Has not been able to get all food in.  Sometimes he leaves certain food off plan.  Tries to ensure he is getting enough in at each meal to get all calories in for each mal.  Quantity of meat at supper is sometimes too much with all the vegetables he is eating.  Has had a significant amount of popcorn and the skinny cow for snacks.  No events, activity or travel in the next few weeks.  Only goal is to continue to try to lose weight.  At supper he is not able to get all the food in.

## 2022-07-30 ENCOUNTER — Other Ambulatory Visit: Payer: Self-pay | Admitting: Family Medicine

## 2022-07-30 DIAGNOSIS — M25551 Pain in right hip: Secondary | ICD-10-CM

## 2022-07-30 MED ORDER — TIZANIDINE HCL 4 MG PO TABS: 4.0000 mg | ORAL_TABLET | Freq: Four times a day (QID) | ORAL | 0 refills | Status: DC | PRN

## 2022-08-02 ENCOUNTER — Other Ambulatory Visit: Payer: Self-pay | Admitting: Family Medicine

## 2022-08-02 DIAGNOSIS — I10 Essential (primary) hypertension: Secondary | ICD-10-CM

## 2022-08-02 DIAGNOSIS — R059 Cough, unspecified: Secondary | ICD-10-CM

## 2022-08-03 ENCOUNTER — Other Ambulatory Visit: Payer: Self-pay

## 2022-08-05 NOTE — Progress Notes (Signed)
Chief Complaint:   OBESITY Richard [name] is here to discuss his progress with his obesity treatment plan along with follow-up of his obesity related diagnoses. Richard [name] is on the Category 3 Plan and states he is following his eating plan approximately 75-80% of the time. Richard [name] states he is exercising 0 minutes 0 times per week.  Today's visit was #: 3 Starting weight: 310 lbs Starting date: 06/22/2022 Today's weight: 292 lbs Today's date: 07/27/2022 Total lbs lost to date: 18 lbs Total lbs lost since last in-office visit: 11 lbs  Interim History: Last few weeks Richard [name] reports have been great.  Occasional stomach growing and flatulence but otherwise ok.  Has noticed significant bleching as well. Has not been able to get all food in.  Sometimes he leaves certain food off plan.  Tries to ensure he is getting enough in at each meal to get all calories in for each mal.  Quantity of meat at supper is sometimes too much with all the vegetables he is eating.  Has had a significant amount of popcorn and the skinny cow for snacks.  No events, activity or travel in the next few weeks.  Only goal is to continue to try to lose weight.  At supper he is not able to get all the food in.  Subjective:   1. Hypertension associated with diabetes ([place]) Blood pressure slightly elevated today.  Recent increase in Diltizem.  2. Vitamin D deficiency Richard [name] is currently taking prescription Vit D 50,000 IU once a week.  Last vitamin D level of 13.  He notes fatigue.  3. Type 2 diabetes mellitus with hyperglycemia, without long-term current use of insulin (HCC) Denies GI side effects on Mounjaro.  Previously on Victoza.  Assessment/Plan:   1. Hypertension associated with diabetes (Richard [name]) Will refill CARDIZEM 180 mg by mouth daily for 1 month with 0 refills.  -Refill diltiazem (CARDIZEM CD) 180 MG 24 hr capsule; Take 1 capsule (180 mg total) by mouth daily.  Dispense: 30 capsule; Refill: 0  2. Vitamin D  deficiency We will refill Vit D 50K IU once a week for 1 month with 0 refills.  -Refill Vitamin D, Ergocalciferol, (DRISDOL) 1.25 MG (50000 UNIT) CAPS capsule; Take 1 capsule (50,000 Units total) by mouth every 7 (seven) days.  Dispense: 4 capsule; Refill: 0  3. Type 2 diabetes mellitus with hyperglycemia, without long-term current use of insulin (HCC) Will refill Mounjaro 2.5 mg subcu once a week for 1 month with 0 refills.  -Refill tirzepatide (MOUNJARO) 2.5 MG/0.5ML Pen; Inject 2.5 mg into the skin once a week.  Dispense: 2 mL; Refill: 0  4. BMI 40.0-44.9, adult (Richard [name])  5. Morbid obesity (Richard [name]) Richard [name] is currently in the action stage of change. As such, his goal is to continue with weight loss efforts. He has agreed to the Category 3 Plan.   Exercise goals: No exercise has been prescribed at this time.  Behavioral modification strategies: increasing lean protein intake, meal planning and cooking strategies, keeping healthy foods in the home, and planning for success.  Richard [name] has agreed to follow-up with our clinic in 3 weeks. He was informed of the importance of frequent follow-up visits to maximize his success with intensive lifestyle modifications for his multiple health conditions.   Objective:   Blood pressure (!) 147/69, pulse 95, temperature 97.6 F (36.4 C), height '5\' 9"'$  (1.753 m), weight 292 lb (132.5 kg), SpO2 98 %. Body mass index is 43.12 kg/m.  General: Cooperative, alert, well  developed, in no acute distress. HEENT: Conjunctivae and lids unremarkable. Cardiovascular: Regular rhythm.  Lungs: Normal work of breathing. Neurologic: No focal deficits.   Lab Results  Component Value Date   CREATININE 0.85 05/21/2022   BUN 18 05/21/2022   NA 138 05/21/2022   K 4.3 05/21/2022   CL 100 05/21/2022   CO2 25 05/21/2022   Lab Results  Component Value Date   ALT 6 (L) 05/21/2022   AST 14 05/21/2022   ALKPHOS 64 12/01/2021   BILITOT 0.7 05/21/2022   Lab Results   Component Value Date   HGBA1C 5.9 (H) 05/21/2022   HGBA1C 6.4 12/01/2021   HGBA1C 6.5 06/02/2021   HGBA1C 6.4 09/25/2020   HGBA1C 6.2 (H) 02/21/2020   No results found for: "INSULIN" Lab Results  Component Value Date   TSH 0.38 (L) 05/21/2022   Lab Results  Component Value Date   CHOL 151 05/21/2022   HDL 66 05/21/2022   LDLCALC 72 05/21/2022   LDLDIRECT 133.0 12/05/2017   TRIG 51 05/21/2022   CHOLHDL 2.3 05/21/2022   Lab Results  Component Value Date   VD25OH 13 (L) 05/21/2022   Lab Results  Component Value Date   WBC 6.2 05/21/2022   HGB 15.2 05/21/2022   HCT 45.0 05/21/2022   MCV 96.4 05/21/2022   PLT 263 05/21/2022   No results found for: "IRON", "TIBC", "FERRITIN"  Attestation Statements:   Reviewed by clinician on day of visit: allergies, medications, problem list, medical history, surgical history, family history, social history, and previous encounter notes.  I, Elnora Morrison, RMA am acting as transcriptionist for Coralie Common, MD.  I have reviewed the above documentation for accuracy and completeness, and I agree with the above. - Coralie Common, MD

## 2022-08-10 ENCOUNTER — Telehealth: Payer: Self-pay

## 2022-08-10 ENCOUNTER — Telehealth (INDEPENDENT_AMBULATORY_CARE_PROVIDER_SITE_OTHER): Payer: BC Managed Care – PPO | Admitting: Family

## 2022-08-10 DIAGNOSIS — U071 COVID-19: Secondary | ICD-10-CM | POA: Diagnosis not present

## 2022-08-10 MED ORDER — LIDOCAINE VISCOUS HCL 2 % MT SOLN: 15.0000 mL | Freq: Four times a day (QID) | OROMUCOSAL | 0 refills | Status: DC | PRN

## 2022-08-10 MED ORDER — NIRMATRELVIR/RITONAVIR (PAXLOVID)TABLET: 3.0000 | ORAL_TABLET | Freq: Two times a day (BID) | ORAL | 0 refills | Status: AC

## 2022-08-10 NOTE — Telephone Encounter (Signed)
Initial Comment Caller states he tested positive for covid and has a cough (no blood), runny nose, soar throat and swollen glands. Caller states he wants plaxovid called in. Translation No Disp. Time Eilene Ghazi Time) Disposition Final User 08/10/2022 9:11:31 AM Attempt made - message left Lissa Merlin RN, Vernie Shanks 08/10/2022 9:31:20 AM Attempt made - message left Lissa Merlin RN, Vernie Shanks 08/10/2022 9:47:52 AM FINAL ATTEMPT MADE - message left Tyler Pita 08/10/2022 9:48:05 AM Send to RN Final Attempt Paul Half, RN, [place]big[place] 08/10/2022 10:32:11 AM Attempt made - no message left Markus Daft RN, Sherre Poot 08/10/2022 10:41:06 AM FINAL ATTEMPT MADE - no message left Yes Markus Daft RN, [place] Final Disposition 08/10/2022 10:41:06 AM FINAL ATTEMPT MADE - no message left Yes Markus Daft, RN, [place]

## 2022-08-10 NOTE — Progress Notes (Signed)
Richard [name] is a 72 y.o. male with the following history as recorded in EpicCare:  Patient Active Problem List   Diagnosis Date Noted   Right shoulder pain 02/25/2022   Acute right otitis media 02/25/2022   Morbid obesity ([place]) 02/25/2022   Controlled type 2 diabetes mellitus with complication, without long-term current use of insulin ([place]) 06/02/2021   Cough 12/30/2020   Uncontrolled type 2 diabetes mellitus with hyperglycemia ([place]) 02/21/2020   Right hip pain 08/16/2018   Viral upper respiratory tract infection 03/10/2017   Hx of adenomatous colonic polyps    Benign neoplasm of descending colon    Primary hypertension 09/06/2016   Tooth abscess 09/01/2015   Sinusitis 11/08/2014   Sinusitis, acute 10/28/2014   Trigger ring finger of right hand 02/06/2013   Suspicious mole 02/06/2013   Avulsion of toenail 12/11/2010   Acute sinusitis 08/17/2010   WOUND, FINGER 07/10/2010   SCIATICA, LEFT 09/02/2009   UNSPECIFIED PERIPHERAL VASCULAR DISEASE 07/04/2009   Hyperlipidemia LDL goal <70 05/05/2009   HEARTBURN 04/22/2009   DM (diabetes mellitus) type II uncontrolled, periph vascular disorder ([place]) 01/23/2009    Current Outpatient Medications  Medication Sig Dispense Refill   lidocaine (XYLOCAINE) 2 % solution Use as directed 15 mLs in the mouth or throat every 6 (six) hours as needed for mouth pain (gargle and spit). 100 mL 0   Accu-Chek Softclix Lancets lancets Check blood sugars once daily 100 each 12   ammonium lactate (AMLACTIN) 12 % cream APPLY TOPICALLY AS NEEDED FOR DRY SKIN. 385 g 0   atorvastatin (LIPITOR) 40 MG tablet TAKE 1 TABLET BY MOUTH AT BEDTIME. 90 tablet 1   Blood Glucose Monitoring Suppl (ACCU-CHEK GUIDE ME) w/Device KIT Check blood sugars once daily 1 kit 0   diltiazem (CARDIZEM CD) 180 MG 24 hr capsule Take 1 capsule (180 mg total) by mouth daily. 30 capsule 0   FARXIGA 5 MG TABS tablet TAKE 1 TABLET BY MOUTH EVERY DAY 90 tablet 1   glucose blood (ACCU-CHEK  GUIDE) test strip Check blood sugars once daily 100 each 12   ibuprofen (ADVIL) 200 MG tablet Take 600 mg by mouth every 6 (six) hours as needed.     Insulin Pen Needle (B-D UF III MINI PEN NEEDLES) 31G X 5 MM MISC USE AS DIRECTED 100 each 0   metFORMIN (GLUCOPHAGE) 500 MG tablet Take 2 tablets (1,000 mg total) by mouth 2 (two) times daily with a meal. 360 tablet 1   naproxen sodium (ALEVE) 220 MG tablet Take 220 mg by mouth. 3 tablets     nirmatrelvir/ritonavir (PAXLOVID) 20 x 150 MG & 10 x '100MG'$  TABS Take 3 tablets by mouth 2 (two) times daily for 5 days. 30 tablet 0   olmesartan-hydrochlorothiazide (BENICAR HCT) 40-25 MG tablet Take 1 tablet by mouth daily. 90 tablet 1   oxyCODONE-acetaminophen (PERCOCET) 10-325 MG tablet Take 1 tablet by mouth every 8 (eight) hours as needed for pain. 90 tablet 0   tirzepatide (MOUNJARO) 2.5 MG/0.5ML Pen Inject 2.5 mg into the skin once a week. 2 mL 0   tiZANidine (ZANAFLEX) 4 MG tablet Take 1 tablet (4 mg total) by mouth every 6 (six) hours as needed for muscle spasms. 30 tablet 0   triamcinolone cream (KENALOG) 0.1 % Apply 1 Application topically 2 (two) times daily. 80 g 5   Vitamin D, Ergocalciferol, (DRISDOL) 1.25 MG (50000 UNIT) CAPS capsule Take 1 capsule (50,000 Units total) by mouth every 7 (seven) days. 4 capsule  0   No current facility-administered medications for this visit.    Allergies: Semaglutide  Past Medical History:  Diagnosis Date   Allergy    seasonal   Arthritis     left knee,  right hip   Back pain    Bilateral swelling of feet    Complication of anesthesia    half awake during last colonscopy   Constipation    Diabetes mellitus    type 2   Hx of adenomatous colonic polyps    2010 6 mm adenoma 10/06/2016 2 5 mm descending polyps    Hyperlipidemia    Hypertension    Joint pain    Lactose intolerance     Past Surgical History:  Procedure Laterality Date   COLONOSCOPY     COLONOSCOPY WITH PROPOFOL N/A 10/06/2016    Procedure: COLONOSCOPY WITH PROPOFOL;  Surgeon: Gatha Mayer, MD;  Location: [facility];  Service: Endoscopy;  Laterality: N/A;    Family History  Problem Relation Age of Onset   Hyperlipidemia Mother    Hypertension Mother    Diabetes Mother    Diabetes Sister    Hypertension Sister    Stroke Sister    Diabetes Brother    Colon cancer Neg Hx    Colon polyps Neg Hx    Rectal cancer Neg Hx    Stomach cancer Neg Hx    Esophageal cancer Neg Hx     Social History   Tobacco Use   Smoking status: Never   Smokeless tobacco: Never  Substance Use Topics   Alcohol use: Yes    Comment: occasionally     Subjective:    I connected with Richard [name] on 08/10/22 at 11:00 AM EST by and verified that I am speaking with the correct person using two identifiers.   I discussed the limitations of evaluation and management by telemedicine and the availability of in person appointments. The patient expressed understanding and agreed to proceed. Provider in office/ patient is at home; provider and patient are only 2 people on video call.   Started last night with sore throat/ swollen glands; tested positive for COVID this morning; notes that wife is sick with COVID also- she returned from a trip recently; using OTC Tylenol and Ibuprofen with limited benefit;    Objective:  There were no vitals filed for this visit.  General: Well developed, well nourished, in no acute distress  Skin : Warm and dry.  Head: Normocephalic and atraumatic  Eyes: Sclera and conjunctiva clear; pupils round and reactive to light; extraocular movements intact  Lungs: Respirations unlabored; Neurologic: Alert and oriented; speech intact; face symmetrical; moves all extremities well; CNII-XII intact without focal deficit   Assessment:  1. COVID-19     Plan:  Rx for Paxlovid- use as directed; he understands to hold is Atorvastatin while on medication and for 5 days after completing; Rx for Viscous lidocaine to use  for pain relief for throat; increase fluids, rest and follow up worse, no better;   No follow-ups on file.  No orders of the defined types were placed in this encounter.   Requested Prescriptions   Signed Prescriptions Disp Refills   nirmatrelvir/ritonavir (PAXLOVID) 20 x 150 MG & 10 x '100MG'$  TABS 30 tablet 0    Sig: Take 3 tablets by mouth 2 (two) times daily for 5 days.   lidocaine (XYLOCAINE) 2 % solution 100 mL 0    Sig: Use as directed 15 mLs in the mouth or throat  every 6 (six) hours as needed for mouth pain (gargle and spit).

## 2022-08-10 NOTE — Telephone Encounter (Signed)
Appt today

## 2022-08-16 ENCOUNTER — Ambulatory Visit (INDEPENDENT_AMBULATORY_CARE_PROVIDER_SITE_OTHER): Payer: BC Managed Care – PPO | Admitting: Family Medicine

## 2022-08-23 ENCOUNTER — Telehealth (INDEPENDENT_AMBULATORY_CARE_PROVIDER_SITE_OTHER): Payer: Self-pay | Admitting: Family Medicine

## 2022-08-23 ENCOUNTER — Other Ambulatory Visit: Payer: Self-pay | Admitting: Family Medicine

## 2022-08-23 DIAGNOSIS — E785 Hyperlipidemia, unspecified: Secondary | ICD-10-CM

## 2022-08-23 NOTE — Telephone Encounter (Signed)
L/mess for pt 9:43, 08/23/22

## 2022-08-23 NOTE — Telephone Encounter (Signed)
Pt would like for Cara to give him a call. Pt states that it is no rush. Please call at [phone].

## 2022-08-24 ENCOUNTER — Other Ambulatory Visit: Payer: Self-pay | Admitting: Family Medicine

## 2022-08-24 DIAGNOSIS — M25551 Pain in right hip: Secondary | ICD-10-CM

## 2022-08-24 MED ORDER — TIZANIDINE HCL 4 MG PO TABS: 4.0000 mg | ORAL_TABLET | Freq: Four times a day (QID) | ORAL | 0 refills | Status: DC | PRN

## 2022-08-24 NOTE — Telephone Encounter (Signed)
Spoke w/ pt-CS

## 2022-08-30 ENCOUNTER — Encounter (INDEPENDENT_AMBULATORY_CARE_PROVIDER_SITE_OTHER): Payer: Self-pay | Admitting: Family Medicine

## 2022-08-30 ENCOUNTER — Ambulatory Visit (INDEPENDENT_AMBULATORY_CARE_PROVIDER_SITE_OTHER): Payer: BC Managed Care – PPO | Admitting: Family Medicine

## 2022-08-30 VITALS — BP 153/83 | HR 87 | Temp 98.3°F | Ht 69.0 in | Wt 285.0 lb

## 2022-08-30 DIAGNOSIS — Z6841 Body Mass Index (BMI) 40.0 and over, adult: Secondary | ICD-10-CM

## 2022-08-30 DIAGNOSIS — Z7985 Long-term (current) use of injectable non-insulin antidiabetic drugs: Secondary | ICD-10-CM

## 2022-08-30 DIAGNOSIS — I152 Hypertension secondary to endocrine disorders: Secondary | ICD-10-CM

## 2022-08-30 DIAGNOSIS — E1165 Type 2 diabetes mellitus with hyperglycemia: Secondary | ICD-10-CM

## 2022-08-30 DIAGNOSIS — E669 Obesity, unspecified: Secondary | ICD-10-CM

## 2022-08-30 DIAGNOSIS — E559 Vitamin D deficiency, unspecified: Secondary | ICD-10-CM | POA: Diagnosis not present

## 2022-08-30 DIAGNOSIS — E1159 Type 2 diabetes mellitus with other circulatory complications: Secondary | ICD-10-CM

## 2022-08-30 MED ORDER — VITAMIN D (ERGOCALCIFEROL) 1.25 MG (50000 UNIT) PO CAPS: 50000.0000 [IU] | ORAL_CAPSULE | ORAL | 0 refills | Status: DC

## 2022-08-30 MED ORDER — TIRZEPATIDE 2.5 MG/0.5ML ~~LOC~~ SOAJ: 2.5000 mg | SUBCUTANEOUS | 0 refills | Status: DC

## 2022-08-30 MED ORDER — DILTIAZEM HCL ER COATED BEADS 180 MG PO CP24: 180.0000 mg | ORAL_CAPSULE | Freq: Every day | ORAL | 0 refills | Status: DC

## 2022-08-30 NOTE — Progress Notes (Signed)
Chief Complaint:   OBESITY Richard [name] is here to discuss his progress with his obesity treatment plan along with follow-up of his obesity related diagnoses. Richard [name] is on the Category 3 Plan and states he is following his eating plan approximately 45% of the time. Richard [name] states he is walking 10 to 12 minutes 7 times per week.  Today's visit was #: 4 Starting weight: 310 LBS Starting date: 06/22/2022 Today's weight: 285 LBS Today's date: 08/30/2022 Total lbs lost to date: 25 LBS Total lbs lost since last in-office visit: 7 LBS  Interim History: After last appointment he got COVID; caught it from his wife.  He went on Paxlovid and symptoms only lasted 3 days.  While ill he really wasn't able to get in much food.  He is having a hard time getting all food in consistently.  Has a hard time particularly with supper meal to get 8oz of any meat in.  He is having some stomach gurgling and cramps at night, occasionally waking him up.    Subjective:   1. Vitamin D deficiency Patient is on prescription vitamin D.  Patient denies nausea, vomiting, muscle weakness but is positive for fatigue.  2. Type 2 diabetes mellitus with hyperglycemia, without long-term current use of insulin (HCC) Patient's fasting blood sugars 90s to 110s.  No feelings of hypoglycemia.  Patient is on [place], [place], [place].  3. Hypertension associated with diabetes ([place]) Blood pressure controlled today.  Patient still not feeling back to normal.  Patient has been eating more carbs recently.  Assessment/Plan:   1. Vitamin D deficiency Refill- Vitamin D, Ergocalciferol, (DRISDOL) 1.25 MG (50000 UNIT) CAPS capsule; Take 1 capsule (50,000 Units total) by mouth every 7 (seven) days.  Dispense: 4 capsule; Refill: 0  2. Type 2 diabetes mellitus with hyperglycemia, without long-term current use of insulin (HCC) Discussed stopping Farxiga when fasting blood sugars 70s for 2 days.  Refill- tirzepatide (MOUNJARO) 2.5  MG/0.5ML Pen; Inject 2.5 mg into the skin once a week.  Dispense: 2 mL; Refill: 0  3. Hypertension associated with diabetes ([place]) Refill- diltiazem (CARDIZEM CD) 180 MG 24 hr capsule; Take 1 capsule (180 mg total) by mouth daily.  Dispense: 30 capsule; Refill: 0  4. BMI 40.0-44.9, adult ([place])  5. Obesity with starting BMI of 45.9 Richard [name] is currently in the action stage of change. As such, his goal is to continue with weight loss efforts. He has agreed to the Category 3 Plan.   Exercise goals: All adults should avoid inactivity. Some physical activity is better than none, and adults who participate in any amount of physical activity gain some health benefits.  Behavioral modification strategies: increasing lean protein intake, no skipping meals, meal planning and cooking strategies, keeping healthy foods in the home, and planning for success.  Richard [name] has agreed to follow-up with our clinic in 3 weeks. He was informed of the importance of frequent follow-up visits to maximize his success with intensive lifestyle modifications for his multiple health conditions.   Objective:   Blood pressure (!) 153/83, pulse 87, temperature 98.3 F (36.8 C), height 5\' 9"  (1.753 m), weight 285 lb (129.3 kg), SpO2 99 %. Body mass index is 42.09 kg/m.  General: Cooperative, alert, well developed, in no acute distress. HEENT: Conjunctivae and lids unremarkable. Cardiovascular: Regular rhythm.  Lungs: Normal work of breathing. Neurologic: No focal deficits.   Lab Results  Component Value Date   CREATININE 0.85 05/21/2022   BUN 18 05/21/2022   NA  138 05/21/2022   K 4.3 05/21/2022   CL 100 05/21/2022   CO2 25 05/21/2022   Lab Results  Component Value Date   ALT 6 (L) 05/21/2022   AST 14 05/21/2022   ALKPHOS 64 12/01/2021   BILITOT 0.7 05/21/2022   Lab Results  Component Value Date   HGBA1C 5.9 (H) 05/21/2022   HGBA1C 6.4 12/01/2021   HGBA1C 6.5 06/02/2021   HGBA1C 6.4 09/25/2020   HGBA1C  6.2 (H) 02/21/2020   No results found for: "INSULIN" Lab Results  Component Value Date   TSH 0.38 (L) 05/21/2022   Lab Results  Component Value Date   CHOL 151 05/21/2022   HDL 66 05/21/2022   LDLCALC 72 05/21/2022   LDLDIRECT 133.0 12/05/2017   TRIG 51 05/21/2022   CHOLHDL 2.3 05/21/2022   Lab Results  Component Value Date   VD25OH 13 (L) 05/21/2022   Lab Results  Component Value Date   WBC 6.2 05/21/2022   HGB 15.2 05/21/2022   HCT 45.0 05/21/2022   MCV 96.4 05/21/2022   PLT 263 05/21/2022   No results found for: "IRON", "TIBC", "FERRITIN"  Attestation Statements:   Reviewed by clinician on day of visit: allergies, medications, problem list, medical history, surgical history, family history, social history, and previous encounter notes.  I, Davy Pique, RMA, am acting as transcriptionist for Coralie Common, MD.  I have reviewed the above documentation for accuracy and completeness, and I agree with the above. - Coralie Common, MD

## 2022-09-03 ENCOUNTER — Other Ambulatory Visit: Payer: Self-pay | Admitting: Family Medicine

## 2022-09-03 DIAGNOSIS — M25551 Pain in right hip: Secondary | ICD-10-CM

## 2022-09-03 MED ORDER — OXYCODONE-ACETAMINOPHEN 10-325 MG PO TABS: 1.0000 | ORAL_TABLET | Freq: Three times a day (TID) | ORAL | 0 refills | Status: DC | PRN

## 2022-09-03 NOTE — Telephone Encounter (Signed)
Requesting: Percocet  Contract: 12/01/2021 UDS: 12/01/21 Last OV: 08/10/2022---vv with Mickel Baas Next OV: N/A Last Refill: 07/23/2022, #90--0 RF Database:   Please advise

## 2022-09-03 NOTE — Telephone Encounter (Signed)
Medication: oxyCODONE-acetaminophen (PERCOCET) 10-325 MG tablet  Has the patient contacted their pharmacy? No.   Preferred Pharmacy:  [id number] - [place], [address] [place], [place] [address] Phone: [phone]  Fax: [phone]

## 2022-09-20 ENCOUNTER — Ambulatory Visit (INDEPENDENT_AMBULATORY_CARE_PROVIDER_SITE_OTHER): Payer: BC Managed Care – PPO | Admitting: Family Medicine

## 2022-09-20 ENCOUNTER — Encounter (INDEPENDENT_AMBULATORY_CARE_PROVIDER_SITE_OTHER): Payer: Self-pay | Admitting: Family Medicine

## 2022-09-20 VITALS — BP 147/70 | HR 95 | Temp 98.0°F | Ht 69.0 in | Wt 285.0 lb

## 2022-09-20 DIAGNOSIS — Z7985 Long-term (current) use of injectable non-insulin antidiabetic drugs: Secondary | ICD-10-CM

## 2022-09-20 DIAGNOSIS — E1159 Type 2 diabetes mellitus with other circulatory complications: Secondary | ICD-10-CM | POA: Diagnosis not present

## 2022-09-20 DIAGNOSIS — E559 Vitamin D deficiency, unspecified: Secondary | ICD-10-CM

## 2022-09-20 DIAGNOSIS — I152 Hypertension secondary to endocrine disorders: Secondary | ICD-10-CM | POA: Diagnosis not present

## 2022-09-20 DIAGNOSIS — E669 Obesity, unspecified: Secondary | ICD-10-CM

## 2022-09-20 DIAGNOSIS — Z6841 Body Mass Index (BMI) 40.0 and over, adult: Secondary | ICD-10-CM

## 2022-09-20 DIAGNOSIS — E1165 Type 2 diabetes mellitus with hyperglycemia: Secondary | ICD-10-CM | POA: Diagnosis not present

## 2022-09-20 MED ORDER — DILTIAZEM HCL ER COATED BEADS 180 MG PO CP24: 180.0000 mg | ORAL_CAPSULE | Freq: Every day | ORAL | 0 refills | Status: DC

## 2022-09-20 MED ORDER — VITAMIN D (ERGOCALCIFEROL) 1.25 MG (50000 UNIT) PO CAPS: 50000.0000 [IU] | ORAL_CAPSULE | ORAL | 0 refills | Status: DC

## 2022-09-20 NOTE — Progress Notes (Unsigned)
Chief Complaint:   OBESITY Richard [name] is here to discuss his progress with his obesity treatment plan along with follow-up of his obesity related diagnoses. Richard [name] is on the Category 3 Plan and states he is following his eating plan approximately 60% of the time. [name] states he is cleaning the house.   Today's visit was #: 5 Starting weight: 310 lbs Starting date: 06/22/2022 Today's weight: 285 lbs Today's date: 09/20/2022 Total lbs lost to date: 25 lbs Total lbs lost since last in-office visit: 0  Interim History: Patient voices that the food tastes flavorless and so is having a hard time getting all the food in.  He mentions his portion sizes have decreased significantly.  Not sure if his taste changed after COVID.  Only thing that tasted good this weekend was Skinny Cow ice cream he ate.  He said [employer] doesn't even taste the same anymore.  Has changed the Mounjaro to every 10-12 days apart due to dyspepsia.  No upcoming plans, events or travel.    Subjective:   1. Hypertension associated with diabetes Patient blood pressure is controlled.  Patient denies chest pain, chest pressure or headache.   2. Vitamin D deficiency Patient is on prescription Vitamin D.  She denies nausea, vomiting or muscle weakness, but is positive for fatigue.  3. Type 2 diabetes mellitus with hyperglycemia, without long-term current use of insulin Patient is on metformin and Mounjaro.  Patient has significant satiety.   Assessment/Plan:   1. Hypertension associated with diabetes Refill - diltiazem (CARDIZEM CD) 180 MG 24 hr capsule; Take 1 capsule (180 mg total) by mouth daily.  Dispense: 90 capsule; Refill: 0  2. Vitamin D deficiency Refill - Vitamin D, Ergocalciferol, (DRISDOL) 1.25 MG (50000 UNIT) CAPS capsule; Take 1 capsule (50,000 Units total) by mouth every 7 (seven) days.  Dispense: 4 capsule; Refill: 0  3. Type 2 diabetes mellitus with hyperglycemia, without long-term current use of  insulin Hold Mounjaro until next appointment.   4. BMI 40.0-44.9, adult  5. Obesity with starting BMI of 45.9 [name] is currently in the action stage of change. As such, his goal is to continue with weight loss efforts. He has agreed to the Category 2 Plan or Category 3 Plan.   Exercise goals: No exercise has been prescribed at this time.  Behavioral modification strategies: increasing lean protein intake, meal planning and cooking strategies, keeping healthy foods in the home, and planning for success.  Kardin has agreed to follow-up with our clinic in 3 weeks. He was informed of the importance of frequent follow-up visits to maximize his success with intensive lifestyle modifications for his multiple health conditions.   Objective:   Blood pressure (!) 147/70, pulse 95, temperature 98 F (36.7 C), height 5\' 9"  (1.753 m), weight 285 lb (129.3 kg), SpO2 98 %. Body mass index is 42.09 kg/m.  General: Cooperative, alert, well developed, in no acute distress. HEENT: Conjunctivae and lids unremarkable. Cardiovascular: Regular rhythm.  Lungs: Normal work of breathing. Neurologic: No focal deficits.   Lab Results  Component Value Date   CREATININE 0.85 05/21/2022   BUN 18 05/21/2022   NA 138 05/21/2022   K 4.3 05/21/2022   CL 100 05/21/2022   CO2 25 05/21/2022   Lab Results  Component Value Date   ALT 6 (L) 05/21/2022   AST 14 05/21/2022   ALKPHOS 64 12/01/2021   BILITOT 0.7 05/21/2022   Lab Results  Component Value Date   HGBA1C 5.9 (H)  05/21/2022   HGBA1C 6.4 12/01/2021   HGBA1C 6.5 06/02/2021   HGBA1C 6.4 09/25/2020   HGBA1C 6.2 (H) 02/21/2020   No results found for: "INSULIN" Lab Results  Component Value Date   TSH 0.38 (L) 05/21/2022   Lab Results  Component Value Date   CHOL 151 05/21/2022   HDL 66 05/21/2022   LDLCALC 72 05/21/2022   LDLDIRECT 133.0 12/05/2017   TRIG 51 05/21/2022   CHOLHDL 2.3 05/21/2022   Lab Results  Component Value Date    VD25OH 13 (L) 05/21/2022   Lab Results  Component Value Date   WBC 6.2 05/21/2022   HGB 15.2 05/21/2022   HCT 45.0 05/21/2022   MCV 96.4 05/21/2022   PLT 263 05/21/2022   No results found for: "IRON", "TIBC", "FERRITIN"  Attestation Statements:   Reviewed by clinician on day of visit: allergies, medications, problem list, medical history, surgical history, family history, social history, and previous encounter notes.  I, Malcolm Metro, RMA, am acting as transcriptionist for Reuben Likes, MD.  I have reviewed the above documentation for accuracy and completeness, and I agree with the above. - Reuben Likes, MD

## 2022-09-28 ENCOUNTER — Other Ambulatory Visit: Payer: Self-pay | Admitting: Family Medicine

## 2022-09-28 DIAGNOSIS — M25551 Pain in right hip: Secondary | ICD-10-CM

## 2022-09-28 MED ORDER — TIZANIDINE HCL 4 MG PO TABS
4.0000 mg | ORAL_TABLET | Freq: Four times a day (QID) | ORAL | 1 refills | Status: DC | PRN
Start: 2022-09-28 — End: 2022-11-30

## 2022-10-01 NOTE — Progress Notes (Signed)
error 

## 2022-10-03 ENCOUNTER — Other Ambulatory Visit: Payer: Self-pay | Admitting: Family Medicine

## 2022-10-03 DIAGNOSIS — M25551 Pain in right hip: Secondary | ICD-10-CM

## 2022-10-04 MED ORDER — OXYCODONE-ACETAMINOPHEN 10-325 MG PO TABS
1.0000 | ORAL_TABLET | Freq: Three times a day (TID) | ORAL | 0 refills | Status: DC | PRN
Start: 2022-10-04 — End: 2022-10-30

## 2022-10-04 NOTE — Telephone Encounter (Signed)
Requesting: oxycodone 10-325mg   Contract: 12/01/21 UDS: 12/01/21 Last Visit: 05/21/22 Next Visit: None Last Refill: 09/03/22 #90 and 0RF   Please Advise

## 2022-10-05 ENCOUNTER — Telehealth: Payer: Self-pay | Admitting: Family Medicine

## 2022-10-05 DIAGNOSIS — I10 Essential (primary) hypertension: Secondary | ICD-10-CM

## 2022-10-05 MED ORDER — OLMESARTAN MEDOXOMIL-HCTZ 40-25 MG PO TABS
1.0000 | ORAL_TABLET | Freq: Every day | ORAL | 1 refills | Status: DC
Start: 2022-10-05 — End: 2022-11-30

## 2022-10-05 NOTE — Telephone Encounter (Signed)
Prescription Request  10/05/2022  Is this a "Controlled Substance" medicine? No  LOV: 05/21/2022  What is the name of the medication or equipment?  olmesartan-hydrochlorothiazide (BENICAR HCT) 40-25 MG tablet   Have you contacted your pharmacy to request a refill? Yes   Which pharmacy would you like this sent to?  [id number], [place] - [address] [place] [address] Phone: [phone] Fax: [phone]    Patient notified that their request is being sent to the clinical staff for review and that they should receive a response within 2 business days.   Please advise at  [facility] [phone]

## 2022-10-05 NOTE — Telephone Encounter (Signed)
Rx sent 

## 2022-10-11 ENCOUNTER — Ambulatory Visit (INDEPENDENT_AMBULATORY_CARE_PROVIDER_SITE_OTHER): Payer: BC Managed Care – PPO | Admitting: Family Medicine

## 2022-10-11 ENCOUNTER — Encounter (INDEPENDENT_AMBULATORY_CARE_PROVIDER_SITE_OTHER): Payer: Self-pay | Admitting: Family Medicine

## 2022-10-11 VITALS — BP 170/60 | HR 85 | Temp 98.8°F | Ht 69.0 in | Wt 295.0 lb

## 2022-10-11 DIAGNOSIS — E559 Vitamin D deficiency, unspecified: Secondary | ICD-10-CM | POA: Diagnosis not present

## 2022-10-11 DIAGNOSIS — E1165 Type 2 diabetes mellitus with hyperglycemia: Secondary | ICD-10-CM

## 2022-10-11 DIAGNOSIS — L859 Epidermal thickening, unspecified: Secondary | ICD-10-CM | POA: Diagnosis not present

## 2022-10-11 DIAGNOSIS — Z6841 Body Mass Index (BMI) 40.0 and over, adult: Secondary | ICD-10-CM

## 2022-10-11 DIAGNOSIS — E669 Obesity, unspecified: Secondary | ICD-10-CM | POA: Diagnosis not present

## 2022-10-11 MED ORDER — VITAMIN D (ERGOCALCIFEROL) 1.25 MG (50000 UNIT) PO CAPS: 50000.0000 [IU] | ORAL_CAPSULE | ORAL | 0 refills | Status: DC

## 2022-10-11 NOTE — Progress Notes (Signed)
Chief Complaint:   OBESITY Richard [name] is here to discuss his progress with his obesity treatment plan along with follow-up of his obesity related diagnoses. [name] is on the Category 2 Plan or the Category 3 Plan and states he is following his eating plan approximately 65% of the time. [name] states he is not exercising.  Today's visit was #: 6 Starting weight: 310 LBS Starting date: 06/22/2022 Today's weight: 295 LBS Today's date: 10/11/2022 Total lbs lost to date: 15 LBS Total lbs lost since last in-office visit: +10 LBS  Interim History: Patient voices that he has had something things pop up.  Foodwise he has been eating a much as he can get in particularly at dinner time.  Breakfast has been mostly eggs.  He has been drank more soda in the last few weeks than he has in the last few years.    Subjective:   1. Vitamin D deficiency Patient is on prescription Vitamin D.  Patient denies nausea, vomiting, muscle weakness, but is positive for fatigue.  2. Hyperkeratosis, Pilaris Patient's reports skin changes in arms and with scratches.  Patient reports increase in itching.  3. Type 2 diabetes mellitus with hyperglycemia, without long-term current use of insulin [facility]) Patient is on Parseghian daily.  Patient stopped Mounjaro.  Patient has increased snack calories recently needs over 300.  Assessment/Plan:   1. Vitamin D deficiency Refill- Vitamin D, Ergocalciferol, (DRISDOL) 1.25 MG (50000 UNIT) CAPS capsule; Take 1 capsule (50,000 Units total) by mouth every 7 (seven) days.  Dispense: 4 capsule; Refill: 0  2. Hyperkeratosis, Pilaris Patient encouraged to stop exfoliating and shower/bath, and use salicylic acid wash.  3. Type 2 diabetes mellitus with hyperglycemia, without long-term current use of insulin (HCC) Follow-up on control of snack calories.  Follow-up on blood sugar at next appointment.  4. BMI 40.0-44.9, adult (HCC)  5. Obesity with starting BMI of 45.9 [name] is  currently in the action stage of change. As such, his goal is to continue with weight loss efforts. He has agreed to the Category 3 Plan.   Exercise goals: No exercise has been prescribed at this time.  Behavioral modification strategies: increasing lean protein intake, meal planning and cooking strategies, keeping healthy foods in the home, and planning for success.  [name] has agreed to follow-up with our clinic in 2-3 weeks. He was informed of the importance of frequent follow-up visits to maximize his success with intensive lifestyle modifications for his multiple health conditions.   Objective:   Blood pressure (!) 170/60, pulse 85, temperature 98.8 F (37.1 C), height 5\' 9"  (1.753 m), weight 295 lb (133.8 kg), SpO2 98 %. Body mass index is 43.56 kg/m.  General: Cooperative, alert, well developed, in no acute distress. HEENT: Conjunctivae and lids unremarkable. Cardiovascular: Regular rhythm.  Lungs: Normal work of breathing. Neurologic: No focal deficits.   Lab Results  Component Value Date   CREATININE 0.85 05/21/2022   BUN 18 05/21/2022   NA 138 05/21/2022   K 4.3 05/21/2022   CL 100 05/21/2022   CO2 25 05/21/2022   Lab Results  Component Value Date   ALT 6 (L) 05/21/2022   AST 14 05/21/2022   ALKPHOS 64 12/01/2021   BILITOT 0.7 05/21/2022   Lab Results  Component Value Date   HGBA1C 5.9 (H) 05/21/2022   HGBA1C 6.4 12/01/2021   HGBA1C 6.5 06/02/2021   HGBA1C 6.4 09/25/2020   HGBA1C 6.2 (H) 02/21/2020   No results found for: "INSULIN" Lab  Results  Component Value Date   TSH 0.38 (L) 05/21/2022   Lab Results  Component Value Date   CHOL 151 05/21/2022   HDL 66 05/21/2022   LDLCALC 72 05/21/2022   LDLDIRECT 133.0 12/05/2017   TRIG 51 05/21/2022   CHOLHDL 2.3 05/21/2022   Lab Results  Component Value Date   VD25OH 13 (L) 05/21/2022   Lab Results  Component Value Date   WBC 6.2 05/21/2022   HGB 15.2 05/21/2022   HCT 45.0 05/21/2022   MCV 96.4  05/21/2022   PLT 263 05/21/2022   No results found for: "IRON", "TIBC", "FERRITIN"  Attestation Statements:   Reviewed by clinician on day of visit: allergies, medications, problem list, medical history, surgical history, family history, social history, and previous encounter notes.  I, Malcolm Metro, RMA, am acting as transcriptionist for Reuben Likes, MD.  I have reviewed the above documentation for accuracy and completeness, and I agree with the above. - Reuben Likes, MD

## 2022-10-30 ENCOUNTER — Other Ambulatory Visit: Payer: Self-pay | Admitting: Family Medicine

## 2022-10-30 DIAGNOSIS — M25551 Pain in right hip: Secondary | ICD-10-CM

## 2022-11-01 ENCOUNTER — Encounter (INDEPENDENT_AMBULATORY_CARE_PROVIDER_SITE_OTHER): Payer: Self-pay | Admitting: Family Medicine

## 2022-11-01 ENCOUNTER — Ambulatory Visit (INDEPENDENT_AMBULATORY_CARE_PROVIDER_SITE_OTHER): Payer: BC Managed Care – PPO | Admitting: Family Medicine

## 2022-11-01 VITALS — BP 150/68 | HR 79 | Temp 98.4°F | Ht 69.0 in | Wt 284.0 lb

## 2022-11-01 DIAGNOSIS — E1159 Type 2 diabetes mellitus with other circulatory complications: Secondary | ICD-10-CM

## 2022-11-01 DIAGNOSIS — I152 Hypertension secondary to endocrine disorders: Secondary | ICD-10-CM | POA: Diagnosis not present

## 2022-11-01 DIAGNOSIS — M25511 Pain in right shoulder: Secondary | ICD-10-CM

## 2022-11-01 DIAGNOSIS — Z6841 Body Mass Index (BMI) 40.0 and over, adult: Secondary | ICD-10-CM

## 2022-11-01 DIAGNOSIS — E559 Vitamin D deficiency, unspecified: Secondary | ICD-10-CM | POA: Diagnosis not present

## 2022-11-01 DIAGNOSIS — E669 Obesity, unspecified: Secondary | ICD-10-CM

## 2022-11-01 MED ORDER — OXYCODONE-ACETAMINOPHEN 10-325 MG PO TABS
1.0000 | ORAL_TABLET | Freq: Three times a day (TID) | ORAL | 0 refills | Status: DC | PRN
Start: 2022-11-01 — End: 2022-11-30

## 2022-11-01 NOTE — Telephone Encounter (Signed)
Requesting: Percocet Contract: 12/01/2021  UDS: 12/01/2021 Last Visit: 05/21/2022 Next Visit: N/A Last Refill: 10/04/2022  Please Advise

## 2022-11-01 NOTE — Progress Notes (Signed)
Chief Complaint:   OBESITY Richard [name] is here to discuss his progress with his obesity treatment plan along with follow-up of his obesity related diagnoses. Richard [name] is on the Category 3 Plan and states he is following his eating plan approximately 80% of the time. Richard [name] states he is walking and doing house work 7 times per week.    Today's visit was #: 7 Starting weight: 310 lbs Starting date: 06/22/2022 Today's weight: 284 lbs Today's date: 11/01/2022 Total lbs lost to date: 26 Total lbs lost since last in-office visit: 11  Interim History: Patient pulled himself more on plan over the last few weeks- stopped soda, stopped bologna sandwiches and grilled cheese sandwiches and stopped chips. Having a hard time with snack calories currently- still doing popcorn occasionally and will do skinny cow or fruit.   Subjective:   1. Right shoulder pain, unspecified chronicity Richard [name] feels like he likely has pinched surgery. He has been to Sports Medicine in the past and would opt for physical therapy.    2. Vitamin D deficiency Richard [name] denies nausea, vomiting, or muscle weakness but notes fatigue. His last Vitamin D level was of 13.  3. Hypertension associated with diabetes (HCC) Richard [name]'s blood pressure is elevated again today. He reports sometimes he misses his diltiazem, which is likely taking a toll on accurate readings. He has sometimes missed his medication (diltiazem) due to concerns of diltiazem and oxycodone.   Assessment/Plan:   1. Right shoulder pain, unspecified chronicity We will refer to physical therapy for shoulder evaluation and treatment.   - Ambulatory referral to Physical Therapy  2. Vitamin D deficiency We will refill prescription Vitamin D 50,000 IU once weekly #4 for 1 month.   3. Hypertension associated with diabetes (HCC) We will follow-up on his blood pressure at his next appointment. Richard [name] was encouraged to keep his medications at his bedside to ensure he  takes his medications.   4. BMI 40.0-44.9, adult (HCC)  5. Obesity with starting BMI of 45.9 Richard [name] is currently in the action stage of change. As such, his goal is to continue with weight loss efforts. He has agreed to the Category 3 Plan.   Exercise goals: All adults should avoid inactivity. Some physical activity is better than none, and adults who participate in any amount of physical activity gain some health benefits.  Behavioral modification strategies: increasing lean protein intake, meal planning and cooking strategies, keeping healthy foods in the home, and planning for success.  Richard [name] has agreed to follow-up with our clinic in 4 to 5 weeks. He was informed of the importance of frequent follow-up visits to maximize his success with intensive lifestyle modifications for his multiple health conditions.   Objective:   Blood pressure (!) 150/68, pulse 79, temperature 98.4 F (36.9 C), height 5\' 9"  (1.753 m), weight 284 lb (128.8 kg), SpO2 97 %. Body mass index is 41.94 kg/m.  General: Cooperative, alert, well developed, in no acute distress. HEENT: Conjunctivae and lids unremarkable. Cardiovascular: Regular rhythm.  Lungs: Normal work of breathing. Neurologic: No focal deficits.   Lab Results  Component Value Date   CREATININE 0.85 05/21/2022   BUN 18 05/21/2022   NA 138 05/21/2022   K 4.3 05/21/2022   CL 100 05/21/2022   CO2 25 05/21/2022   Lab Results  Component Value Date   ALT 6 (L) 05/21/2022   AST 14 05/21/2022   ALKPHOS 64 12/01/2021   BILITOT 0.7 05/21/2022   Lab Results  Component Value  Date   HGBA1C 5.9 (H) 05/21/2022   HGBA1C 6.4 12/01/2021   HGBA1C 6.5 06/02/2021   HGBA1C 6.4 09/25/2020   HGBA1C 6.2 (H) 02/21/2020   No results found for: "INSULIN" Lab Results  Component Value Date   TSH 0.38 (L) 05/21/2022   Lab Results  Component Value Date   CHOL 151 05/21/2022   HDL 66 05/21/2022   LDLCALC 72 05/21/2022   LDLDIRECT 133.0 12/05/2017    TRIG 51 05/21/2022   CHOLHDL 2.3 05/21/2022   Lab Results  Component Value Date   VD25OH 13 (L) 05/21/2022   Lab Results  Component Value Date   WBC 6.2 05/21/2022   HGB 15.2 05/21/2022   HCT 45.0 05/21/2022   MCV 96.4 05/21/2022   PLT 263 05/21/2022   No results found for: "IRON", "TIBC", "FERRITIN"  Attestation Statements:   Reviewed by clinician on day of visit: allergies, medications, problem list, medical history, surgical history, family history, social history, and previous encounter notes.   I, Burt Knack, am acting as transcriptionist for Reuben Likes, MD.  I have reviewed the above documentation for accuracy and completeness, and I agree with the above. - Reuben Likes, MD

## 2022-11-08 MED ORDER — VITAMIN D (ERGOCALCIFEROL) 1.25 MG (50000 UNIT) PO CAPS: 50000.0000 [IU] | ORAL_CAPSULE | ORAL | 0 refills | Status: DC

## 2022-11-14 ENCOUNTER — Other Ambulatory Visit: Payer: Self-pay | Admitting: Family Medicine

## 2022-11-14 DIAGNOSIS — E1151 Type 2 diabetes mellitus with diabetic peripheral angiopathy without gangrene: Secondary | ICD-10-CM

## 2022-11-17 ENCOUNTER — Encounter: Payer: Self-pay | Admitting: Physical Therapy

## 2022-11-17 ENCOUNTER — Ambulatory Visit: Payer: BC Managed Care – PPO | Admitting: Physical Therapy

## 2022-11-17 ENCOUNTER — Other Ambulatory Visit: Payer: Self-pay

## 2022-11-17 DIAGNOSIS — M6281 Muscle weakness (generalized): Secondary | ICD-10-CM | POA: Diagnosis not present

## 2022-11-17 DIAGNOSIS — M25511 Pain in right shoulder: Secondary | ICD-10-CM

## 2022-11-17 NOTE — Therapy (Addendum)
OUTPATIENT PHYSICAL THERAPY SHOULDER EVALUATION /DISCHARGE   Patient Name: Richard [name] MRN: [id number] DOB:13-Dec-1950, 72 y.o., male Today's Date: 11/17/2022  END OF SESSION:  PT End of Session - 11/17/22 0941     Visit Number 1    Number of Visits 1    PT Start Time 0856    PT Stop Time 0934    PT Time Calculation (min) 38 min    Activity Tolerance Patient tolerated treatment well    Behavior During Therapy [facility] for tasks assessed/performed             Past Medical History:  Diagnosis Date   Allergy    seasonal   Arthritis     left knee,  right hip   Back pain    Bilateral swelling of feet    Complication of anesthesia    half awake during last colonscopy   Constipation    Diabetes mellitus    type 2   Hx of adenomatous colonic polyps    2010 6 mm adenoma 10/06/2016 2 5 mm descending polyps    Hyperlipidemia    Hypertension    Joint pain    Lactose intolerance    Past Surgical History:  Procedure Laterality Date   COLONOSCOPY     COLONOSCOPY WITH PROPOFOL N/A 10/06/2016   Procedure: COLONOSCOPY WITH PROPOFOL;  Surgeon: [name], MD;  Location: [facility];  Service: Endoscopy;  Laterality: N/A;   Patient Active Problem List   Diagnosis Date Noted   Right shoulder pain 02/25/2022   Acute right otitis media 02/25/2022   Morbid obesity (HCC) 02/25/2022   Controlled type 2 diabetes mellitus with complication, without long-term current use of insulin (HCC) 06/02/2021   Cough 12/30/2020   Uncontrolled type 2 diabetes mellitus with hyperglycemia (HCC) 02/21/2020   Right hip pain 08/16/2018   Viral upper respiratory tract infection 03/10/2017   Hx of adenomatous colonic polyps    Benign neoplasm of descending colon    Primary hypertension 09/06/2016   Tooth abscess 09/01/2015   Sinusitis 11/08/2014   Sinusitis, acute 10/28/2014   Trigger ring finger of right hand 02/06/2013   Suspicious mole 02/06/2013   Avulsion of toenail 12/11/2010   Acute  sinusitis 08/17/2010   WOUND, FINGER 07/10/2010   SCIATICA, LEFT 09/02/2009   UNSPECIFIED PERIPHERAL VASCULAR DISEASE 07/04/2009   Hyperlipidemia LDL goal <70 05/05/2009   HEARTBURN 04/22/2009   DM (diabetes mellitus) type II uncontrolled, periph vascular disorder (HCC) 01/23/2009    PCP: [name], DO   REFERRING PROVIDER: [name], MD  REFERRING DIAG: M25.511 (ICD-10-CM) - Right shoulder pain, unspecified chronicity  THERAPY DIAG:  Muscle weakness (generalized)  Acute pain of right shoulder  Rationale for Evaluation and Treatment: Rehabilitation  ONSET DATE: 4 week onset of Rt shoulder pain/weakness  SUBJECTIVE:  SUBJECTIVE STATEMENT: He relays 4 week onset of Rt shoulder pain/weakness. He does not recall any MOI. His biggest complaint is sudden weakness and he can't lift his arm over his head. He feels like it is a pinched nerve possibly. He has N/T 2 times per week where his whole hand feels like it falls asleep. He has not had any imaging to his neck or shoulder, but did see chiropractor  Hand dominance: Right  PERTINENT HISTORY: [id number], DM,HTN, Rt hip OA (has THA scheduled 03/14/23)  PAIN:  Are you having pain? Yes: NPRS scale: 0 at rest, can get to maybe 3-4 in shoulder/neck but the fact he can't lift it is his biggest concern. /10 Pain location: Rt shoulder Pain description: "feels funny, tingling" Aggravating factors: lifting his arm Relieving factors: rest  PRECAUTIONS: None  WEIGHT BEARING RESTRICTIONS: No  FALLS:  Has patient fallen in last 6 months? No  OCCUPATION: retired  PLOF: Independent  PATIENT GOALS:get his Rt arm better  NEXT MD VISIT:   OBJECTIVE:   DIAGNOSTIC FINDINGS:  Nothing for shoulders or neck  PATIENT SURVEYS:  FOTO one  time visit so not necessary  COGNITION: Overall cognitive status: Within functional limits for tasks assessed      UPPER EXTREMITY ROM:   Active ROM/PROM Right eval Left eval  Shoulder flexion 40/130   Shoulder extension    Shoulder abduction 60/120   Shoulder adduction    Shoulder internal rotation WFL   Shoulder external rotation WFL but very weak   Elbow flexion    Elbow extension    Wrist flexion    Wrist extension    Wrist ulnar deviation    Wrist radial deviation    Wrist pronation    Wrist supination    (Blank rows = not tested)  UPPER EXTREMITY MMT:  MMT Right eval Left eval  Shoulder flexion 2   Shoulder extension    Shoulder abduction 2   Shoulder adduction    Shoulder internal rotation 5   Shoulder external rotation 2   Middle trapezius    Lower trapezius    Elbow flexion 3   Elbow extension 5   Wrist flexion    Wrist extension    Wrist ulnar deviation    Wrist radial deviation    Wrist pronation 5   Wrist supination 3   Grip strength (lbs) 64# 82.7#  (Blank rows = not tested)  SHOULDER SPECIAL TESTS:  Rotator cuff assessment: Drop arm test: positive  Negative Spurlings test  + radial nerve tension test, negative for median and ulnar    TODAY'S TREATMENT:  Eval Therex: HEP creation and review with demonstration and trial set preformed, see below for details PT recommendations that MRI/imaging would be recommended    PATIENT EDUCATION: Education details: HEP, PT plan of care Person educated: Patient Education method: Explanation, Demonstration, Verbal cues, and Handouts Education comprehension: verbalized understanding and needs further education   HOME EXERCISE PROGRAM: Access Code: [email or web address] URL: [email or web address] Date: 11/17/2022 Prepared by: Ivery Quale  Exercises - Seated Passive Cervical Retraction  - 3 x daily - 6 x weekly - 1 sets - 10 reps - Radial Nerve Gliding: Waiter's Tip  - 3 x daily - 6 x  weekly - 1 sets - 10 reps - Seated Single Arm Elbow Flexion with Resistance  - 2 x daily - 6 x weekly - 2-3 sets - 10 reps - Seated Wrist Supination Pronation with Can  - 2 x daily - 6 x weekly -  2-3 sets - 10 reps - Seated Shoulder Flexion AAROM with Dowel  - 2 x daily - 6 x weekly - 2-3 sets - 10 reps  ASSESSMENT:  CLINICAL IMPRESSION: Patient referred to PT for Rt shoulder pain. Upon further PT evaluation he has sudden severe weakness in Rt shoulder consistent with either biceps and or RTC tear, or nerve impingement in radial nerve distribution. Due to this sudden severe weakness PT is recommending he follow back up with MD for further imaging/testing. I did give him an HEP to work on in the meantime for strengthening, nerve floss, and mckenzie neck exercises.  Patient understood these recommendations and agrees to hold off on PT until further MD/diagnostic testing and will call us back to return after if needed  OBJECTIVE IMPAIRMENTS: decreased activity tolerance, difficulty walking, decreased balance, decreased endurance, decreased mobility, decreased ROM, decreased strength, impaired flexibility, impaired UE use, postural dysfunction, and pain.  ACTIVITY LIMITATIONS: bending, lifting, carry, locomotion, cleaning, community activity, driving, and or occupation  PERSONAL FACTORS: [id number], DM,HTN, Rt hip OA (has THA scheduled 03/14/23) are also affecting patient's functional outcome.  REHAB POTENTIAL: Fair , would recommend further imaging first  CLINICAL DECISION MAKING: Evolving/moderate complexity  EVALUATION COMPLEXITY: Moderate    GOALS:   Pt will show understanding of HEP and PT recommendations Baseline:  Goal status: MET today Other PT goals to be established if he returns to PT PLAN: PT FREQUENCY: 1 time visit for now  PT DURATION: 6-8 weeks  PLANNED INTERVENTIONS (unless contraindicated): aquatic PT, Canalith repositioning, cryotherapy, Electrical stimulation,  Iontophoresis with 4 mg/ml dexamethasome, Moist heat, traction, Ultrasound, gait training, Therapeutic exercise, balance training, neuromuscular re-education, patient/family education, prosthetic training, manual techniques, passive ROM, dry needling, taping, vasopnuematic device, vestibular, spinal manipulations, joint manipulations  PLAN FOR NEXT SESSION: hold PT and trial HEP, he return if he needs to later.     April Manson, PT,DPT 11/17/2022, 9:42 AM   PHYSICAL THERAPY DISCHARGE SUMMARY  Visits from Start of Care: 1  Current functional level related to goals / functional outcomes: See note   Remaining deficits: See note   Education / Equipment: HEP  Patient goals were met. Patient is being discharged due to not returning since the last visit.  Chyrel Masson, PT, DPT, OCS, ATC 12/23/22  9:31 AM

## 2022-11-30 ENCOUNTER — Other Ambulatory Visit (HOSPITAL_BASED_OUTPATIENT_CLINIC_OR_DEPARTMENT_OTHER): Payer: Self-pay

## 2022-11-30 ENCOUNTER — Ambulatory Visit: Payer: BC Managed Care – PPO | Admitting: Family Medicine

## 2022-11-30 ENCOUNTER — Encounter: Payer: Self-pay | Admitting: Family Medicine

## 2022-11-30 VITALS — BP 144/74 | HR 100 | Temp 98.2°F | Resp 22 | Ht 69.0 in | Wt 278.4 lb

## 2022-11-30 DIAGNOSIS — M25511 Pain in right shoulder: Secondary | ICD-10-CM

## 2022-11-30 DIAGNOSIS — Z7984 Long term (current) use of oral hypoglycemic drugs: Secondary | ICD-10-CM

## 2022-11-30 DIAGNOSIS — E1159 Type 2 diabetes mellitus with other circulatory complications: Secondary | ICD-10-CM

## 2022-11-30 DIAGNOSIS — M25551 Pain in right hip: Secondary | ICD-10-CM

## 2022-11-30 DIAGNOSIS — G8929 Other chronic pain: Secondary | ICD-10-CM

## 2022-11-30 DIAGNOSIS — T148XXA Other injury of unspecified body region, initial encounter: Secondary | ICD-10-CM

## 2022-11-30 DIAGNOSIS — I152 Hypertension secondary to endocrine disorders: Secondary | ICD-10-CM

## 2022-11-30 DIAGNOSIS — E118 Type 2 diabetes mellitus with unspecified complications: Secondary | ICD-10-CM | POA: Diagnosis not present

## 2022-11-30 DIAGNOSIS — E559 Vitamin D deficiency, unspecified: Secondary | ICD-10-CM

## 2022-11-30 DIAGNOSIS — E785 Hyperlipidemia, unspecified: Secondary | ICD-10-CM

## 2022-11-30 DIAGNOSIS — E1169 Type 2 diabetes mellitus with other specified complication: Secondary | ICD-10-CM

## 2022-11-30 DIAGNOSIS — I10 Essential (primary) hypertension: Secondary | ICD-10-CM

## 2022-11-30 LAB — COMPREHENSIVE METABOLIC PANEL
ALT: 11 U/L (ref 0–53)
AST: 12 U/L (ref 0–37)
Albumin: 4.5 g/dL (ref 3.5–5.2)
Alkaline Phosphatase: 57 U/L (ref 39–117)
BUN: 19 mg/dL (ref 6–23)
CO2: 28 mEq/L (ref 19–32)
Calcium: 9.8 mg/dL (ref 8.4–10.5)
Chloride: 96 mEq/L (ref 96–112)
Creatinine, Ser: 0.9 mg/dL (ref 0.40–1.50)
GFR: 86.92 mL/min (ref >60.00)
Glucose, Bld: 91 mg/dL (ref 70–99)
Potassium: 4.5 mEq/L (ref 3.5–5.1)
Sodium: 136 mEq/L (ref 135–145)
Total Bilirubin: 1.3 mg/dL — ABNORMAL HIGH (ref 0.2–1.2)
Total Protein: 7.9 g/dL (ref 6.0–8.3)

## 2022-11-30 LAB — CBC WITH DIFFERENTIAL/PLATELET
Basophils Absolute: 0.1 10*3/uL (ref 0.0–0.1)
Basophils Relative: 1.3 % (ref 0.0–3.0)
Eosinophils Absolute: 0 10*3/uL (ref 0.0–0.7)
Eosinophils Relative: 0.2 % (ref 0.0–5.0)
HCT: 43.7 % (ref 39.0–52.0)
Hemoglobin: 14.2 g/dL (ref 13.0–17.0)
Lymphocytes Relative: 12.3 % (ref 12.0–46.0)
Lymphs Abs: 1.1 10*3/uL (ref 0.7–4.0)
MCHC: 32.4 g/dL (ref 30.0–36.0)
MCV: 100 fl (ref 78.0–100.0)
Monocytes Absolute: 0.8 10*3/uL (ref 0.1–1.0)
Monocytes Relative: 8.6 % (ref 3.0–12.0)
Neutro Abs: 6.9 10*3/uL (ref 1.4–7.7)
Neutrophils Relative %: 77.6 % — ABNORMAL HIGH (ref 43.0–77.0)
Platelets: 317 10*3/uL (ref 150.0–400.0)
RBC: 4.37 Mil/uL (ref 4.22–5.81)
RDW: 13.3 % (ref 11.5–15.5)
WBC: 8.9 10*3/uL (ref 4.0–10.5)

## 2022-11-30 LAB — LIPID PANEL
Cholesterol: 170 mg/dL (ref 0–200)
HDL: 77 mg/dL (ref >39.00)
LDL Cholesterol: 80 mg/dL (ref 0–99)
NonHDL: 93.07
Total CHOL/HDL Ratio: 2
Triglycerides: 64 mg/dL (ref 0.0–149.0)
VLDL: 12.8 mg/dL (ref 0.0–40.0)

## 2022-11-30 LAB — VITAMIN D 25 HYDROXY (VIT D DEFICIENCY, FRACTURES): VITD: 39.18 ng/mL (ref 30.00–100.00)

## 2022-11-30 LAB — HEMOGLOBIN A1C: Hgb A1c MFr Bld: 6 % (ref 4.6–6.5)

## 2022-11-30 LAB — TSH: TSH: 0.4 u[IU]/mL (ref 0.35–5.50)

## 2022-11-30 LAB — VITAMIN B12: Vitamin B-12: 121 pg/mL — ABNORMAL LOW (ref 211–911)

## 2022-11-30 MED ORDER — COMIRNATY 30 MCG/0.3ML IM SUSY
0.3000 mL | PREFILLED_SYRINGE | Freq: Once | INTRAMUSCULAR | 0 refills | Status: DC
  Filled 2022-11-30: qty 0.3, 1d supply, fill #0

## 2022-11-30 MED ORDER — TRIAMCINOLONE ACETONIDE 0.1 % EX CREA
1.0000 | TOPICAL_CREAM | Freq: Two times a day (BID) | CUTANEOUS | 5 refills | Status: DC
Start: 2022-11-30 — End: 2023-10-17

## 2022-11-30 MED ORDER — DAPAGLIFLOZIN PROPANEDIOL 5 MG PO TABS: 5.0000 mg | ORAL_TABLET | Freq: Every day | ORAL | 1 refills | Status: DC

## 2022-11-30 MED ORDER — OLMESARTAN MEDOXOMIL-HCTZ 40-25 MG PO TABS
1.0000 | ORAL_TABLET | Freq: Every day | ORAL | 1 refills | Status: DC
Start: 2022-11-30 — End: 2023-05-06

## 2022-11-30 MED ORDER — TIZANIDINE HCL 4 MG PO TABS
4.0000 mg | ORAL_TABLET | Freq: Four times a day (QID) | ORAL | 1 refills | Status: DC | PRN
Start: 2022-11-30 — End: 2023-02-22

## 2022-11-30 MED ORDER — DILTIAZEM HCL ER COATED BEADS 180 MG PO CP24: 180.0000 mg | ORAL_CAPSULE | Freq: Every day | ORAL | 1 refills | Status: DC

## 2022-11-30 MED ORDER — OXYCODONE-ACETAMINOPHEN 10-325 MG PO TABS
1.0000 | ORAL_TABLET | Freq: Three times a day (TID) | ORAL | 0 refills | Status: DC | PRN
Start: 2022-11-30 — End: 2022-12-27

## 2022-11-30 NOTE — Assessment & Plan Note (Signed)
Pt scheduled for hip replacement

## 2022-11-30 NOTE — Progress Notes (Signed)
Established Patient Office Visit  Subjective   Patient ID: Kyon Fooshee, male    DOB: 07/23/1950  Age: 72 y.o. MRN: [id number]  Chief Complaint  Patient presents with   Shoulder Pain    HPI Discussed the use of AI scribe software for clinical note transcription with the patient, who gave verbal consent to proceed.  History of Present Illness   The patient, with a history of hypertension, diabetes, and hyperlipidemia, presents with right shoulder and arm pain. The pain is limiting his range of motion and is associated with weakness. The patient reports that the pain is sometimes accompanied by a sensation of tightness and a feeling of a knot in the shoulder. He also experiences occasional numbness in the right hand, particularly upon waking, which takes a few minutes to resolve. The patient denies any recent trauma or injury to the shoulder.  In addition to the shoulder and arm pain, the patient reports experiencing restless leg syndrome at night, affecting both legs. He also mentions a recent incident where he cut his thumb while preparing food. The wound bled profusely but has since started to heal after being kept bandaged for two weeks.  The patient has been managing his diabetes with medication, including Victoza, but reports that his daily blood glucose readings have been over 100 recently. He expresses concern that his A1c levels may be increasing.      Patient Active Problem List   Diagnosis Date Noted   Right shoulder pain 02/25/2022   Acute right otitis media 02/25/2022   Morbid obesity (HCC) 02/25/2022   Controlled type 2 diabetes mellitus with complication, without long-term current use of insulin (HCC) 06/02/2021   Cough 12/30/2020   Uncontrolled type 2 diabetes mellitus with hyperglycemia (HCC) 02/21/2020   Right hip pain 08/16/2018   Viral upper respiratory tract infection 03/10/2017   Hx of adenomatous colonic polyps    Benign neoplasm of descending colon     Primary hypertension 09/06/2016   Tooth abscess 09/01/2015   Sinusitis 11/08/2014   Sinusitis, acute 10/28/2014   Trigger ring finger of right hand 02/06/2013   Suspicious mole 02/06/2013   Avulsion of toenail 12/11/2010   Acute sinusitis 08/17/2010   WOUND, FINGER 07/10/2010   SCIATICA, LEFT 09/02/2009   UNSPECIFIED PERIPHERAL VASCULAR DISEASE 07/04/2009   Hyperlipidemia LDL goal <70 05/05/2009   HEARTBURN 04/22/2009   DM (diabetes mellitus) type II uncontrolled, periph vascular disorder (HCC) 01/23/2009   Past Medical History:  Diagnosis Date   Allergy    seasonal   Arthritis     left knee,  right hip   Back pain    Bilateral swelling of feet    Complication of anesthesia    half awake during last colonscopy   Constipation    Diabetes mellitus    type 2   Hx of adenomatous colonic polyps    2010 6 mm adenoma 10/06/2016 2 5 mm descending polyps    Hyperlipidemia    Hypertension    Joint pain    Lactose intolerance    Past Surgical History:  Procedure Laterality Date   COLONOSCOPY     COLONOSCOPY WITH PROPOFOL N/A 10/06/2016   Procedure: COLONOSCOPY WITH PROPOFOL;  Surgeon: [name], MD;  Location: [facility];  Service: Endoscopy;  Laterality: N/A;   Social History   Tobacco Use   Smoking status: Never   Smokeless tobacco: Never  Substance Use Topics   Alcohol use: Yes    Comment: occasionally  Drug use: No   Social History   Socioeconomic History   Marital status: Married    Spouse name: Not on file   Number of children: Not on file   Years of education: Not on file   Highest education level: Not on file  Occupational History   Not on file  Tobacco Use   Smoking status: Never   Smokeless tobacco: Never  Substance and Sexual Activity   Alcohol use: Yes    Comment: occasionally    Drug use: No   Sexual activity: Not on file  Other Topics Concern   Not on file  Social History Narrative   Not on file   Social Determinants of Health    Financial Resource Strain: Not on file  Food Insecurity: Not on file  Transportation Needs: Not on file  Physical Activity: Not on file  Stress: Not on file  Social Connections: Not on file  Intimate Partner Violence: Not on file   Family Status  Relation Name Status   Mother  (Not Specified)   Sister  (Not Specified)   Brother  (Not Specified)   Neg Hx  (Not Specified)   Family History  Problem Relation Age of Onset   Hyperlipidemia Mother    Hypertension Mother    Diabetes Mother    Diabetes Sister    Hypertension Sister    Stroke Sister    Diabetes Brother    Colon cancer Neg Hx    Colon polyps Neg Hx    Rectal cancer Neg Hx    Stomach cancer Neg Hx    Esophageal cancer Neg Hx    Allergies  Allergen Reactions   Semaglutide Other (See Comments)    RYBELSUS. Severe headaches.       Review of Systems  Constitutional:  Negative for fever and malaise/fatigue.  HENT:  Negative for congestion.   Eyes:  Negative for blurred vision.  Respiratory:  Negative for shortness of breath.   Cardiovascular:  Negative for chest pain, palpitations and leg swelling.  Gastrointestinal:  Negative for abdominal pain, blood in stool and nausea.  Genitourinary:  Negative for dysuria and frequency.  Musculoskeletal:  Positive for joint pain and myalgias. Negative for falls.  Skin:  Negative for rash.  Neurological:  Negative for dizziness, loss of consciousness and headaches.  Endo/Heme/Allergies:  Negative for environmental allergies.  Psychiatric/Behavioral:  Negative for depression. The patient is not nervous/anxious.       Objective:     BP (!) 144/74 (BP Location: Left Arm, Patient Position: Sitting, Cuff Size: Large)   Pulse 100   Temp 98.2 F (36.8 C) (Oral)   Resp (!) 22   Ht 5\' 9"  (1.753 m)   Wt 278 lb 6.4 oz (126.3 kg)   SpO2 97%   BMI 41.11 kg/m  BP Readings from Last 3 Encounters:  11/30/22 (!) 144/74  11/01/22 (!) 150/68  10/11/22 (!) 170/60   Wt  Readings from Last 3 Encounters:  11/30/22 278 lb 6.4 oz (126.3 kg)  11/01/22 284 lb (128.8 kg)  10/11/22 295 lb (133.8 kg)   SpO2 Readings from Last 3 Encounters:  11/30/22 97%  11/01/22 97%  10/11/22 98%      Physical Exam Vitals and nursing note reviewed.  Constitutional:      Appearance: He is well-developed.  HENT:     Head: Normocephalic and atraumatic.  Eyes:     Pupils: Pupils are equal, round, and reactive to light.  Neck:  Thyroid: No thyromegaly.  Cardiovascular:     Rate and Rhythm: Normal rate and regular rhythm.     Heart sounds: No murmur heard. Pulmonary:     Effort: Pulmonary effort is normal. No respiratory distress.     Breath sounds: Normal breath sounds. No wheezing or rales.  Chest:     Chest wall: No tenderness.  Musculoskeletal:        General: Tenderness present.     Cervical back: Normal range of motion and neck supple.     Right hip: Tenderness present. Normal range of motion. Normal strength.     Left hip: Tenderness present. Normal range of motion. Normal strength.     Right foot: Bony tenderness present. No swelling.     Left foot: Bony tenderness present. No swelling.     Comments: Pt with pain and spasm R trap and neck---  pain with elevating r arm more then 45 degrees and numbness/ burning in R arm/ hand   Skin:    General: Skin is warm and dry.  Neurological:     General: No focal deficit present.     Mental Status: He is alert and oriented to person, place, and time.     Sensory: Sensory deficit present.  Psychiatric:        Behavior: Behavior normal.        Thought Content: Thought content normal.        Judgment: Judgment normal.      No results found for any visits on 11/30/22.  Last CBC Lab Results  Component Value Date   WBC 6.2 05/21/2022   HGB 15.2 05/21/2022   HCT 45.0 05/21/2022   MCV 96.4 05/21/2022   MCH 32.5 05/21/2022   RDW 11.9 05/21/2022   PLT 263 05/21/2022   Last metabolic panel Lab Results   Component Value Date   GLUCOSE 77 05/21/2022   NA 138 05/21/2022   K 4.3 05/21/2022   CL 100 05/21/2022   CO2 25 05/21/2022   BUN 18 05/21/2022   CREATININE 0.85 05/21/2022   CALCIUM 9.6 05/21/2022   PROT 7.4 05/21/2022   ALBUMIN 4.3 12/01/2021   BILITOT 0.7 05/21/2022   ALKPHOS 64 12/01/2021   AST 14 05/21/2022   ALT 6 (L) 05/21/2022   Last lipids Lab Results  Component Value Date   CHOL 151 05/21/2022   HDL 66 05/21/2022   LDLCALC 72 05/21/2022   LDLDIRECT 133.0 12/05/2017   TRIG 51 05/21/2022   CHOLHDL 2.3 05/21/2022   Last hemoglobin A1c Lab Results  Component Value Date   HGBA1C 5.9 (H) 05/21/2022   Last thyroid functions Lab Results  Component Value Date   TSH 0.38 (L) 05/21/2022   T3TOTAL 90 06/22/2022   Last vitamin D Lab Results  Component Value Date   VD25OH 13 (L) 05/21/2022   Last vitamin B12 and Folate Lab Results  Component Value Date   VITAMINB12 250 05/21/2022   FOLATE 9.6 06/22/2022      The 10-year ASCVD risk score (Arnett DK, et al., 2019) is: 33.6%    Assessment & Plan:   Problem List Items Addressed This Visit       Unprioritized   Morbid obesity (HCC)   Relevant Medications   dapagliflozin propanediol (FARXIGA) 5 MG TABS tablet   Other Relevant Orders   TSH   VITAMIN D 25 Hydroxy (Vit-D Deficiency, Fractures)   Vitamin B12   Insulin, random   Right shoulder pain - Primary    Check  xrays  PT advised getting MRI c cspine       Relevant Orders   DG Shoulder Right   DG Cervical Spine Complete   MR Cervical Spine Wo Contrast   Right hip pain    Pt scheduled for hip replacement       Relevant Medications   tiZANidine (ZANAFLEX) 4 MG tablet   oxyCODONE-acetaminophen (PERCOCET) 10-325 MG tablet   Primary hypertension    Well controlled, no changes to meds. Encouraged heart healthy diet such as the DASH diet and exercise as tolerated.        Relevant Medications   olmesartan-hydrochlorothiazide (BENICAR HCT) 40-25  MG tablet   diltiazem (CARDIZEM CD) 180 MG 24 hr capsule   Hyperlipidemia LDL goal <70    Tolerating statin, encouraged heart healthy diet, avoid trans fats, minimize simple carbs and saturated fats. Increase exercise as tolerated       Relevant Medications   olmesartan-hydrochlorothiazide (BENICAR HCT) 40-25 MG tablet   diltiazem (CARDIZEM CD) 180 MG 24 hr capsule   Controlled type 2 diabetes mellitus with complication, without long-term current use of insulin (HCC)    hgba1c to be checked , minimize simple carbs. Increase exercise as tolerated. Continue current meds       Relevant Medications   dapagliflozin propanediol (FARXIGA) 5 MG TABS tablet   olmesartan-hydrochlorothiazide (BENICAR HCT) 40-25 MG tablet   Other Relevant Orders   CBC with Differential/Platelet   Comprehensive metabolic panel   Hemoglobin A1c   Other Visit Diagnoses     Essential hypertension       Relevant Medications   olmesartan-hydrochlorothiazide (BENICAR HCT) 40-25 MG tablet   diltiazem (CARDIZEM CD) 180 MG 24 hr capsule   Abrasion       Relevant Medications   triamcinolone cream (KENALOG) 0.1 %   Hypertension associated with diabetes (HCC)       Relevant Medications   dapagliflozin propanediol (FARXIGA) 5 MG TABS tablet   olmesartan-hydrochlorothiazide (BENICAR HCT) 40-25 MG tablet   diltiazem (CARDIZEM CD) 180 MG 24 hr capsule   Chronic neck pain       Relevant Medications   tiZANidine (ZANAFLEX) 4 MG tablet   oxyCODONE-acetaminophen (PERCOCET) 10-325 MG tablet   Other Relevant Orders   MR Cervical Spine Wo Contrast   Vitamin D deficiency       Hyperlipidemia associated with type 2 diabetes mellitus (HCC)       Relevant Medications   dapagliflozin propanediol (FARXIGA) 5 MG TABS tablet   olmesartan-hydrochlorothiazide (BENICAR HCT) 40-25 MG tablet   diltiazem (CARDIZEM CD) 180 MG 24 hr capsule   Other Relevant Orders   Lipid panel      Assessment and Plan    Right Shoulder and Neck  Pain: Unclear etiology with weakness and limited range of motion. Possible rotator cuff injury or pinched nerve. No history of trauma. -Order shoulder [facility] at [facility]. -Consider open MRI after the first of July per patient's preference and availability.  Restless Leg Syndrome: Reports bilateral leg shaking at night. -Consider further evaluation and treatment if symptoms persist or worsen.  Hyperglycemia: Concern for rising A1c despite previous control. -Order labs including A1c. -Consider resuming Victoza if A1c is elevated.  Weight Loss: Significant weight loss from 355 lbs to 278 lbs. -Encourage continued weight loss efforts.  General Health Maintenance: -Received COVID-19 vaccine today. -Continue monitoring blood pressure and cholesterol. -Follow-up with [facility] as needed.       No follow-ups on  file.    [name], DO

## 2022-11-30 NOTE — Assessment & Plan Note (Signed)
Check xrays  PT advised getting MRI c cspine

## 2022-11-30 NOTE — Assessment & Plan Note (Signed)
hgba1c to be checked, minimize simple carbs. Increase exercise as tolerated. Continue current meds  

## 2022-11-30 NOTE — Assessment & Plan Note (Signed)
Tolerating statin, encouraged heart healthy diet, avoid trans fats, minimize simple carbs and saturated fats. Increase exercise as tolerated 

## 2022-11-30 NOTE — Assessment & Plan Note (Signed)
Well controlled, no changes to meds. Encouraged heart healthy diet such as the DASH diet and exercise as tolerated.  °

## 2022-12-01 ENCOUNTER — Ambulatory Visit (INDEPENDENT_AMBULATORY_CARE_PROVIDER_SITE_OTHER)
Admission: RE | Admit: 2022-12-01 | Discharge: 2022-12-01 | Disposition: A | Discharge status: Home / Self Care | Payer: BC Managed Care – PPO | Source: Ambulatory Visit | Attending: Family Medicine | Admitting: Family Medicine

## 2022-12-01 DIAGNOSIS — M25511 Pain in right shoulder: Secondary | ICD-10-CM

## 2022-12-01 DIAGNOSIS — G8929 Other chronic pain: Secondary | ICD-10-CM

## 2022-12-01 DIAGNOSIS — M542 Cervicalgia: Secondary | ICD-10-CM

## 2022-12-01 LAB — INSULIN, RANDOM: Insulin: 10.3 u[IU]/mL

## 2022-12-03 ENCOUNTER — Other Ambulatory Visit: Payer: Self-pay

## 2022-12-03 DIAGNOSIS — G8929 Other chronic pain: Secondary | ICD-10-CM

## 2022-12-06 ENCOUNTER — Encounter (INDEPENDENT_AMBULATORY_CARE_PROVIDER_SITE_OTHER): Payer: Self-pay | Admitting: Family Medicine

## 2022-12-06 ENCOUNTER — Ambulatory Visit (INDEPENDENT_AMBULATORY_CARE_PROVIDER_SITE_OTHER): Payer: BC Managed Care – PPO | Admitting: Family Medicine

## 2022-12-06 VITALS — BP 134/69 | HR 80 | Temp 98.2°F | Ht 69.0 in | Wt 273.0 lb

## 2022-12-06 DIAGNOSIS — E669 Obesity, unspecified: Secondary | ICD-10-CM

## 2022-12-06 DIAGNOSIS — E1159 Type 2 diabetes mellitus with other circulatory complications: Secondary | ICD-10-CM | POA: Diagnosis not present

## 2022-12-06 DIAGNOSIS — E559 Vitamin D deficiency, unspecified: Secondary | ICD-10-CM

## 2022-12-06 DIAGNOSIS — I152 Hypertension secondary to endocrine disorders: Secondary | ICD-10-CM | POA: Diagnosis not present

## 2022-12-06 DIAGNOSIS — Z6841 Body Mass Index (BMI) 40.0 and over, adult: Secondary | ICD-10-CM

## 2022-12-06 MED ORDER — VITAMIN D (ERGOCALCIFEROL) 1.25 MG (50000 UNIT) PO CAPS: 50000.0000 [IU] | ORAL_CAPSULE | ORAL | 0 refills | Status: DC

## 2022-12-06 NOTE — Progress Notes (Unsigned)
Chief Complaint:   OBESITY Richard [name] is here to discuss his progress with his obesity treatment plan along with follow-up of his obesity related diagnoses. Richard [name] is on the Category 3 Plan and states he is following his eating plan approximately 70% of the time. Richard [name] states he is doing some walking.  Today's visit was #: 8 Starting weight: 310 lbs Starting date: 06/22/2022 Today's weight: 273 lbs Today's date: 12/06/2022 Total lbs lost to date: 37 Total lbs lost since last in-office visit: 11  Interim History: Since last appointment patient has been mostly living life.  Planning to go to orthopedic July 3rd to get shoulder evaluated.  Patient is anticipating more interventions for his shoulder when he sees his orthopedist at that time.  No upcoming plans for July 4th. Patient has been eating more frequently smaller amounts.  He is doing more fruit at lunch with minimal protein.  Dinner is mostly meat and not as many vegetables as he knows he needs.   Subjective:   1. Vitamin D deficiency Patient is on prescription vitamin D.  He denies nausea, vomiting, or muscle weakness but notes fatigue.  2. Hypertension associated with diabetes (HCC) Patient's blood pressure initially was elevated but better on repeat, and he reports blood pressure was within normal limits this morning.  Assessment/Plan:   1. Vitamin D deficiency Patient will continue prescription vitamin D once weekly, and we will refill for 1 month.  - Vitamin D, Ergocalciferol, (DRISDOL) 1.25 MG (50000 UNIT) CAPS capsule; Take 1 capsule (50,000 Units total) by mouth every 7 (seven) days.  Dispense: 4 capsule; Refill: 0  2. Hypertension associated with diabetes [facility]) Patient will continue his current medications with no change in dose or medication.  3. BMI 40.0-44.9, adult (HCC)  4. Obesity with starting BMI of 45.9 Richard [name] is currently in the action stage of change. As such, his goal is to continue with weight loss  efforts. He has agreed to the Category 3 Plan.   Exercise goals: All adults should avoid inactivity. Some physical activity is better than none, and adults who participate in any amount of physical activity gain some health benefits.  Behavioral modification strategies: increasing lean protein intake, meal planning and cooking strategies, keeping healthy foods in the home, and planning for success.  Richard [name] has agreed to follow-up with our clinic in 5 weeks. He was informed of the importance of frequent follow-up visits to maximize his success with intensive lifestyle modifications for his multiple health conditions.   Objective:   Blood pressure 134/69, pulse 80, temperature 98.2 F (36.8 C), height 5\' 9"  (1.753 m), weight 273 lb (123.8 kg), SpO2 98 %. Body mass index is 40.32 kg/m.  General: Cooperative, alert, well developed, in no acute distress. HEENT: Conjunctivae and lids unremarkable. Cardiovascular: Regular rhythm.  Lungs: Normal work of breathing. Neurologic: No focal deficits.   Lab Results  Component Value Date   CREATININE 0.90 11/30/2022   BUN 19 11/30/2022   NA 136 11/30/2022   K 4.5 11/30/2022   CL 96 11/30/2022   CO2 28 11/30/2022   Lab Results  Component Value Date   ALT 11 11/30/2022   AST 12 11/30/2022   ALKPHOS 57 11/30/2022   BILITOT 1.3 (H) 11/30/2022   Lab Results  Component Value Date   HGBA1C 6.0 11/30/2022   HGBA1C 5.9 (H) 05/21/2022   HGBA1C 6.4 12/01/2021   HGBA1C 6.5 06/02/2021   HGBA1C 6.4 09/25/2020   No results found for: "INSULIN" Lab  Results  Component Value Date   TSH 0.40 11/30/2022   Lab Results  Component Value Date   CHOL 170 11/30/2022   HDL 77.00 11/30/2022   LDLCALC 80 11/30/2022   LDLDIRECT 133.0 12/05/2017   TRIG 64.0 11/30/2022   CHOLHDL 2 11/30/2022   Lab Results  Component Value Date   VD25OH 39.18 11/30/2022   VD25OH 13 (L) 05/21/2022   Lab Results  Component Value Date   WBC 8.9 11/30/2022   HGB 14.2  11/30/2022   HCT 43.7 11/30/2022   MCV 100.0 11/30/2022   PLT 317.0 11/30/2022   No results found for: "IRON", "TIBC", "FERRITIN"  Attestation Statements:   Reviewed by clinician on day of visit: allergies, medications, problem list, medical history, surgical history, family history, social history, and previous encounter notes.   I, Burt Knack, am acting as transcriptionist for Reuben Likes, MD. I have reviewed the above documentation for accuracy and completeness, and I agree with the above. - Reuben Likes, MD

## 2022-12-08 ENCOUNTER — Other Ambulatory Visit: Payer: Self-pay | Admitting: Family Medicine

## 2022-12-08 DIAGNOSIS — R9389 Abnormal findings on diagnostic imaging of other specified body structures: Secondary | ICD-10-CM

## 2022-12-08 DIAGNOSIS — G8929 Other chronic pain: Secondary | ICD-10-CM

## 2022-12-13 ENCOUNTER — Telehealth (INDEPENDENT_AMBULATORY_CARE_PROVIDER_SITE_OTHER): Payer: Self-pay | Admitting: Family Medicine

## 2022-12-13 NOTE — Telephone Encounter (Signed)
7/1 Patient wants to know if he can eat tuna fish with eggs and relish. Patient would love for someone to call him back. Richard [name]

## 2022-12-15 ENCOUNTER — Ambulatory Visit: Payer: BC Managed Care – PPO | Admitting: Orthopaedic Surgery

## 2022-12-15 ENCOUNTER — Encounter: Payer: Self-pay | Admitting: Orthopaedic Surgery

## 2022-12-15 DIAGNOSIS — G8929 Other chronic pain: Secondary | ICD-10-CM

## 2022-12-15 DIAGNOSIS — M25511 Pain in right shoulder: Secondary | ICD-10-CM

## 2022-12-15 NOTE — Progress Notes (Signed)
Office Visit Note   Patient: Richard [name]           Date of Birth: 1950/08/20           MRN: [id number] Visit Date: 12/15/2022              Requested by: [address] [place], [place] [address] [place],  [place] [address] PCP: Zola Button, Grayling Congress, DO   Assessment & Plan: Visit Diagnoses:  1. 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  Based on this I will send him back to outpatient PT for strengthening for 6 weeks.  If symptoms do not improve we will then order an MRI of the right shoulder.  Follow-Up Instructions: No follow-ups on file.   Orders:  Orders Placed This Encounter  Procedures   Ambulatory referral to Physical Therapy   No orders of the defined types were placed in this encounter.     Procedures: No procedures performed   Clinical Data: No additional findings.   Subjective: Chief Complaint  Patient presents with   Right Shoulder - Pain    HPI Richard [name] is a 72 year old gentleman here for evaluation of right shoulder dysfunction for 1 month.  Denies any injuries.  Feels tightness to the superior portion of the shoulder.  Has trouble raising his arm. Review of Systems  Constitutional: Negative.   HENT: Negative.    Eyes: Negative.   Respiratory: Negative.    Cardiovascular: Negative.   Gastrointestinal: Negative.   Endocrine: Negative.   Genitourinary: Negative.   Skin: Negative.   Allergic/Immunologic: Negative.   Neurological: Negative.   Hematological: Negative.   Psychiatric/Behavioral: Negative.    All other systems reviewed and are negative.    Objective: Vital Signs: There were no vitals taken for this visit.  Physical Exam Vitals and nursing note reviewed.  Constitutional:      Appearance: He is well-developed.  HENT:     Head: Normocephalic and atraumatic.  Eyes:     Pupils:  Pupils are equal, round, and reactive to light.  Pulmonary:     Effort: Pulmonary effort is normal.  Abdominal:     Palpations: Abdomen is soft.  Musculoskeletal:        General: Normal range of motion.     Cervical back: Neck supple.  Skin:    General: Skin is warm.  Neurological:     Mental Status: He is alert and oriented to person, place, and time.  Psychiatric:        Behavior: Behavior normal.        Thought Content: Thought content normal.        Judgment: Judgment normal.     Ortho Exam Examination right shoulder shows minimal active range of motion.  Passive range of motion is slightly limited.  Shrugging compensation to active shoulder abduction or forward flexion.  Overall findings consistent with pseudoparalysis.  Cervical spine exam is unremarkable. Specialty Comments:  No specialty comments available.  Imaging: No results found.   PMFS History: Patient Active Problem List   Diagnosis Date Noted   Right shoulder pain 02/25/2022   Acute right otitis media 02/25/2022   Morbid obesity (HCC) 02/25/2022   Controlled type 2 diabetes mellitus with complication, without long-term current use of insulin (HCC) 06/02/2021   Cough 12/30/2020   Uncontrolled type 2 diabetes mellitus with hyperglycemia (  HCC) 02/21/2020   Right hip pain 08/16/2018   Viral upper respiratory tract infection 03/10/2017   Hx of adenomatous colonic polyps    Benign neoplasm of descending colon    Primary hypertension 09/06/2016   Tooth abscess 09/01/2015   Sinusitis 11/08/2014   Sinusitis, acute 10/28/2014   Trigger ring finger of right hand 02/06/2013   Suspicious mole 02/06/2013   Avulsion of toenail 12/11/2010   Acute sinusitis 08/17/2010   WOUND, FINGER 07/10/2010   SCIATICA, LEFT 09/02/2009   UNSPECIFIED PERIPHERAL VASCULAR DISEASE 07/04/2009   Hyperlipidemia LDL goal <70 05/05/2009   HEARTBURN 04/22/2009   DM (diabetes mellitus) type II uncontrolled, periph vascular disorder (HCC)  01/23/2009   Past Medical History:  Diagnosis Date   Allergy    seasonal   Arthritis     left knee,  right hip   Back pain    Bilateral swelling of feet    Complication of anesthesia    half awake during last colonscopy   Constipation    Diabetes mellitus    type 2   Hx of adenomatous colonic polyps    2010 6 mm adenoma 10/06/2016 2 5 mm descending polyps    Hyperlipidemia    Hypertension    Joint pain    Lactose intolerance     Family History  Problem Relation Age of Onset   Hyperlipidemia Mother    Hypertension Mother    Diabetes Mother    Diabetes Sister    Hypertension Sister    Stroke Sister    Diabetes Brother    Colon cancer Neg Hx    Colon polyps Neg Hx    Rectal cancer Neg Hx    Stomach cancer Neg Hx    Esophageal cancer Neg Hx     Past Surgical History:  Procedure Laterality Date   COLONOSCOPY     COLONOSCOPY WITH PROPOFOL N/A 10/06/2016   Procedure: COLONOSCOPY WITH PROPOFOL;  Surgeon: [name], MD;  Location: [facility];  Service: Endoscopy;  Laterality: N/A;   Social History   Occupational History   Not on file  Tobacco Use   Smoking status: Never   Smokeless tobacco: Never  Substance and Sexual Activity   Alcohol use: Yes    Comment: occasionally    Drug use: No   Sexual activity: Not on file

## 2022-12-23 NOTE — Telephone Encounter (Signed)
Ys, but no may0

## 2022-12-23 NOTE — Telephone Encounter (Signed)
Left detailed information on patients voice mail

## 2022-12-24 ENCOUNTER — Telehealth: Payer: Self-pay | Admitting: Family Medicine

## 2022-12-24 ENCOUNTER — Other Ambulatory Visit: Payer: Self-pay | Admitting: Family Medicine

## 2022-12-24 NOTE — Telephone Encounter (Signed)
Pt called stating that the CT that was ordered for him needed to be changed to be of the shoulder instead of the neck. Pt stated that his ortho provider had told him the problem he is having is in his rotator cuff and needs to look at it.

## 2022-12-27 ENCOUNTER — Other Ambulatory Visit: Payer: Self-pay | Admitting: Family Medicine

## 2022-12-27 DIAGNOSIS — M25551 Pain in right hip: Secondary | ICD-10-CM

## 2022-12-27 NOTE — Therapy (Signed)
OUTPATIENT PHYSICAL THERAPY  EVALUATION   Patient Name: Nemo Sudbury MRN: [id number] DOB:1950-09-22, 72 y.o., male Today's Date: 12/30/2022  END OF SESSION:  PT End of Session - 12/30/22 0825     Visit Number 1    Number of Visits 20    Date for PT Re-Evaluation 03/10/23    Authorization Type BCBS state $52 copay    Progress Note Due on Visit 10    PT Start Time 0845    PT Stop Time 0920    PT Time Calculation (min) 35 min    Activity Tolerance Patient tolerated treatment well    Behavior During Therapy [facility] for tasks assessed/performed             Past Medical History:  Diagnosis Date   Allergy    seasonal   Arthritis     left knee,  right hip   Back pain    Bilateral swelling of feet    Complication of anesthesia    half awake during last colonscopy   Constipation    Diabetes mellitus    type 2   Hx of adenomatous colonic polyps    2010 6 mm adenoma 10/06/2016 2 5 mm descending polyps    Hyperlipidemia    Hypertension    Joint pain    Lactose intolerance    Past Surgical History:  Procedure Laterality Date   COLONOSCOPY     COLONOSCOPY WITH PROPOFOL N/A 10/06/2016   Procedure: COLONOSCOPY WITH PROPOFOL;  Surgeon: [name], MD;  Location: [facility];  Service: Endoscopy;  Laterality: N/A;   Patient Active Problem List   Diagnosis Date Noted   Right shoulder pain 02/25/2022   Acute right otitis media 02/25/2022   Morbid obesity (HCC) 02/25/2022   Controlled type 2 diabetes mellitus with complication, without long-term current use of insulin (HCC) 06/02/2021   Cough 12/30/2020   Uncontrolled type 2 diabetes mellitus with hyperglycemia (HCC) 02/21/2020   Right hip pain 08/16/2018   Viral upper respiratory tract infection 03/10/2017   Hx of adenomatous colonic polyps    Benign neoplasm of descending colon    Primary hypertension 09/06/2016   Tooth abscess 09/01/2015   Sinusitis 11/08/2014   Sinusitis, acute 10/28/2014   Trigger ring finger of  right hand 02/06/2013   Suspicious mole 02/06/2013   Avulsion of toenail 12/11/2010   Acute sinusitis 08/17/2010   WOUND, FINGER 07/10/2010   SCIATICA, LEFT 09/02/2009   UNSPECIFIED PERIPHERAL VASCULAR DISEASE 07/04/2009   Hyperlipidemia LDL goal <70 05/05/2009   HEARTBURN 04/22/2009   DM (diabetes mellitus) type II uncontrolled, periph vascular disorder (HCC) 01/23/2009    PCP: [name] DO  REFERRING PROVIDER: Tarry Kos, MD  REFERRING DIAG: [phone] (ICD-10-CM) - Chronic right shoulder pain  THERAPY DIAG:  Chronic right shoulder pain  Muscle weakness (generalized)  Abnormal posture  Rationale for Evaluation and Treatment: Rehabilitation  ONSET DATE: May 2024  SUBJECTIVE:  SUBJECTIVE STATEMENT: He relays onset of Rt shoulder pain/weakness. He does not recall any MOI. His biggest complaint was weakness and he can't lift his arm over his head.   He reported less concerns about pain and more about use of arm.    Reported rare trouble sleeping due to Rt shoulder.   Reported numbness in hand at times randomly   MD note indicated plan for physical therapy 6 weeks prior to MRI ordering.    PERTINENT HISTORY: [id number], DM,HTN, Rt hip OA (has THA scheduled 03/14/23)  PAIN:  NPRS scale: at worst 2/10 Pain location: Rt shoulder pain Pain description: achy/tightness Aggravating factors: reaching overhead/shoulder height Relieving factors: some light exercises/stretching   PRECAUTIONS: None  WEIGHT BEARING RESTRICTIONS: No  FALLS:  Has patient fallen in last 6 months? No  LIVING ENVIRONMENT: Lives in: House/apartment  OCCUPATION: Retired   PLOF: Independent, Rt hand dominant.   Housecleaning, trash cans out and back to curb  PATIENT GOALS:  Reduce pain, being able  to use arm above head.   OBJECTIVE:   PATIENT SURVEYS:  12/30/2022 FOTO intake:  51   predicted:  66  COGNITION: 12/30/2022 Overall cognitive status: WFL     SENSATION: 12/30/2022 WFL to light touch  POSTURE: 12/30/2022  Rounded shoulders, FHP  UPPER EXTREMITY ROM:   ROM Right 12/30/2022 Left 12/30/2022  Shoulder flexion 70 deg AROM against gravity in sitting with shrug noted  Supine AROM 100 deg [facility]  Shoulder extension    Shoulder abduction 45 deg AROM against gravity in sitting with shrug noted   Shoulder adduction    Shoulder internal rotation    Shoulder external rotation 45 deg PROM in 45 dg abduction supine with muscle guarding   Elbow flexion    Elbow extension    Wrist flexion    Wrist extension    Wrist ulnar deviation    Wrist radial deviation    Wrist pronation    Wrist supination    (Blank rows = not tested)  UPPER EXTREMITY MMT:  MMT Right 12/30/2022 Left 12/30/2022  Shoulder flexion 2/5 5/5  Shoulder extension    Shoulder abduction 2/5 5/5  Shoulder adduction    Shoulder internal rotation 5/5 5/5  Shoulder external rotation 2+/5 5/5  Middle trapezius    Lower trapezius    Elbow flexion 4/5 5/5  Elbow extension 5/5 5/5  Wrist flexion    Wrist extension    Wrist ulnar deviation    Wrist radial deviation    Wrist pronation    Wrist supination    Grip strength (lbs)    (Blank rows = not tested)  SHOULDER SPECIAL TESTS: 12/30/2022 (+) Shrug, drop arm on Rt shoulder   JOINT MOBILITY TESTING:  12/30/2022 No specific limitation in passive accessory movement Rt shoulder mid range in all directions.   PALPATION:  12/30/2022 Tightness in Rt upper trap.  TODAY'S TREATMENT:                                                                                                        DATE:12/30/2022 Therex:    HEP instruction/performance c cues for techniques, handout provided.  Trial set performed of each for comprehension and symptom assessment.  See below for exercise list   PATIENT EDUCATION: 12/30/2022 Education details: HEP, POC Person educated: Patient Education method: Explanation, Demonstration, Verbal cues, and Handouts Education comprehension: verbalized understanding, returned demonstration, and verbal cues required  HOME EXERCISE PROGRAM: Access Code: [facility] URL: [email or web address] Date: 12/30/2022 Prepared by: Chyrel Masson  Exercises - Seated Scapular Retraction  - 3-5 x daily - 7 x weekly - 1 sets - 10 reps - 3-5 hold - Supine Shoulder Flexion Extension AAROM with Dowel  - 1-2 x daily - 7 x weekly - 2-3 sets - 10-15 reps - 3 hold - Seated Shoulder Flexion AAROM with Dowel  - 1-2 x daily - 7 x weekly - 2-3 sets - 10-15 reps - 2 hold - Standing Isometric Shoulder External Rotation with Doorway (Mirrored)  - 2-3 x daily - 7 x weekly - 1 sets - 10 reps - 5 hold  ASSESSMENT:  CLINICAL IMPRESSION: Patient is a 71 y.o. who comes to clinic with complaints of Rt shoulder pain with mobility, strength and movement coordination deficits that impair their ability to perform usual daily and recreational functional activities without increase difficulty/symptoms at this time.  Patient to benefit from skilled PT services to address impairments and limitations to improve to previous level of function without restriction secondary to condition.   OBJECTIVE IMPAIRMENTS: Abnormal gait, decreased activity tolerance, decreased balance, decreased coordination, decreased endurance, decreased mobility, difficulty walking, decreased ROM, decreased strength, impaired perceived functional ability, impaired flexibility, impaired UE functional use, improper body mechanics, postural dysfunction, and pain.   ACTIVITY LIMITATIONS: carrying,  lifting, sleeping, bathing, toileting, dressing, self feeding, reach over head, and hygiene/grooming  PARTICIPATION LIMITATIONS: meal prep, cleaning, laundry, interpersonal relationship, and community activity  PERSONAL FACTORS:  ZOX:WRUEAVW, DM,HTN, Rt hip OA (has THA scheduled 03/14/23)  are also affecting patient's functional outcome.   REHAB POTENTIAL: Good  CLINICAL DECISION MAKING: Stable/uncomplicated  EVALUATION COMPLEXITY: Low   GOALS: Goals reviewed with patient? Yes  SHORT TERM GOALS: (target date for Short term goals are 3 weeks 01/20/2023)  1.Patient will demonstrate independent use of home exercise program to maintain progress from in clinic treatments. Goal status: New  LONG TERM GOALS: (target dates for all long term goals are 10 weeks  03/10/2023 )   1. Patient will demonstrate/report pain at worst less than or equal to 2/10 to facilitate minimal limitation in daily activity secondary to pain symptoms. Goal status: New   2. Patient will demonstrate independent use of home exercise program to facilitate ability to maintain/progress functional gains from skilled physical therapy services. Goal status: New   3. Patient will demonstrate FOTO outcome > or = 66 % to indicate reduced disability due to condition. Goal status: New   4.  Patient will  demonstrate Rt UE MMT 4/5 or greater throughout to facilitate lifting, reaching, carrying at [facility] in daily activity.   Goal status: New   5.  Patient will demonstrate Rt GH joint AROM WFL s symptoms to facilitate usual overhead reaching, self care, dressing at PLOF.    Goal status: New   6.  Patient will demonstrate/report ability to perform usual housework and self care at [facility].  Goal status: New    PLAN:  PT FREQUENCY: 1-2x/week  PT DURATION: 10 weeks  PLANNED INTERVENTIONS: Therapeutic exercises, Therapeutic activity, Neuro Muscular re-education, Balance training, Gait training, Patient/Family education, Joint  mobilization, Stair training, DME instructions, Dry Needling, Electrical stimulation, Traction, Cryotherapy, vasopneumatic device Moist heat, Taping, Ultrasound, Ionotophoresis 4mg /ml Dexamethasone, and aquatic therapy ,Manual therapy.  All included unless contraindicated  PLAN FOR NEXT SESSION: Review HEP knowledge/results.   Gravity reduced AAROM/AROM early strengthening as able.  Avoid shrug.   Chyrel Masson, PT, DPT, OCS, ATC 12/30/22  9:33 AM

## 2022-12-28 MED ORDER — OXYCODONE-ACETAMINOPHEN 10-325 MG PO TABS
1.0000 | ORAL_TABLET | Freq: Three times a day (TID) | ORAL | 0 refills | Status: DC | PRN
Start: 2022-12-28 — End: 2023-01-25

## 2022-12-28 NOTE — Telephone Encounter (Signed)
Requesting: oxycodone 10-325mg  Contract: 12/10/21 UDS: 12/01/21 Last Visit: 11/30/22 Next Visit: None Last Refill: 11/30/22 #90 and 0RF   Please Advise

## 2022-12-28 NOTE — Telephone Encounter (Signed)
Pt is calling for an update. States this needs to be a scan of his shoulder not his neck.

## 2022-12-29 NOTE — Telephone Encounter (Signed)
Imaging has been scheduled.

## 2022-12-30 ENCOUNTER — Other Ambulatory Visit: Payer: Self-pay

## 2022-12-30 ENCOUNTER — Ambulatory Visit: Payer: BC Managed Care – PPO | Admitting: Rehabilitative and Restorative Service Providers"

## 2022-12-30 ENCOUNTER — Encounter: Payer: Self-pay | Admitting: Rehabilitative and Restorative Service Providers"

## 2022-12-30 DIAGNOSIS — R293 Abnormal posture: Secondary | ICD-10-CM

## 2022-12-30 DIAGNOSIS — M6281 Muscle weakness (generalized): Secondary | ICD-10-CM | POA: Diagnosis not present

## 2022-12-30 DIAGNOSIS — G8929 Other chronic pain: Secondary | ICD-10-CM

## 2022-12-30 DIAGNOSIS — M25511 Pain in right shoulder: Secondary | ICD-10-CM

## 2022-12-31 ENCOUNTER — Other Ambulatory Visit: Payer: BC Managed Care – PPO

## 2023-01-10 ENCOUNTER — Encounter: Payer: Self-pay | Admitting: Physical Therapy

## 2023-01-10 ENCOUNTER — Ambulatory Visit: Payer: BC Managed Care – PPO | Admitting: Physical Therapy

## 2023-01-10 DIAGNOSIS — M25511 Pain in right shoulder: Secondary | ICD-10-CM | POA: Diagnosis not present

## 2023-01-10 DIAGNOSIS — G8929 Other chronic pain: Secondary | ICD-10-CM | POA: Diagnosis not present

## 2023-01-10 DIAGNOSIS — R293 Abnormal posture: Secondary | ICD-10-CM

## 2023-01-10 DIAGNOSIS — M6281 Muscle weakness (generalized): Secondary | ICD-10-CM | POA: Diagnosis not present

## 2023-01-10 NOTE — Therapy (Signed)
OUTPATIENT PHYSICAL THERAPY  TREATMENT   Patient Name: Richard [name] MRN: [id number] DOB:1951-02-10, 72 y.o., male Today's Date: 01/10/2023  END OF SESSION:  PT End of Session - 01/10/23 0908     Visit Number 2    Number of Visits 20    Date for PT Re-Evaluation 03/10/23    Authorization Type BCBS state $52 copay    Progress Note Due on Visit 10    PT Start Time [phone]    PT Stop Time 0930    PT Time Calculation (min) 36 min    Activity Tolerance Patient tolerated treatment well    Behavior During Therapy [facility] for tasks assessed/performed             Past Medical History:  Diagnosis Date   Allergy    seasonal   Arthritis     left knee,  right hip   Back pain    Bilateral swelling of feet    Complication of anesthesia    half awake during last colonscopy   Constipation    Diabetes mellitus    type 2   Hx of adenomatous colonic polyps    2010 6 mm adenoma 10/06/2016 2 5 mm descending polyps    Hyperlipidemia    Hypertension    Joint pain    Lactose intolerance    Past Surgical History:  Procedure Laterality Date   COLONOSCOPY     COLONOSCOPY WITH PROPOFOL N/A 10/06/2016   Procedure: COLONOSCOPY WITH PROPOFOL;  Surgeon: [name], MD;  Location: [facility];  Service: Endoscopy;  Laterality: N/A;   Patient Active Problem List   Diagnosis Date Noted   Right shoulder pain 02/25/2022   Acute right otitis media 02/25/2022   Morbid obesity (HCC) 02/25/2022   Controlled type 2 diabetes mellitus with complication, without long-term current use of insulin (HCC) 06/02/2021   Cough 12/30/2020   Uncontrolled type 2 diabetes mellitus with hyperglycemia (HCC) 02/21/2020   Right hip pain 08/16/2018   Viral upper respiratory tract infection 03/10/2017   Hx of adenomatous colonic polyps    Benign neoplasm of descending colon    Primary hypertension 09/06/2016   Tooth abscess 09/01/2015   Sinusitis 11/08/2014   Sinusitis, acute 10/28/2014   Trigger ring finger of  right hand 02/06/2013   Suspicious mole 02/06/2013   Avulsion of toenail 12/11/2010   Acute sinusitis 08/17/2010   WOUND, FINGER 07/10/2010   SCIATICA, LEFT 09/02/2009   UNSPECIFIED PERIPHERAL VASCULAR DISEASE 07/04/2009   Hyperlipidemia LDL goal <70 05/05/2009   HEARTBURN 04/22/2009   DM (diabetes mellitus) type II uncontrolled, periph vascular disorder (HCC) 01/23/2009    PCP: [name] DO  REFERRING PROVIDER: Tarry Kos, MD  REFERRING DIAG: [phone] (ICD-10-CM) - Chronic right shoulder pain  THERAPY DIAG:  Chronic right shoulder pain  Muscle weakness (generalized)  Abnormal posture  Acute pain of right shoulder  Rationale for Evaluation and Treatment: Rehabilitation  ONSET DATE: May 2024  SUBJECTIVE:  SUBJECTIVE STATEMENT: He says her arm feels much better after doing the HEP, he is having less pain and less numbness.   MD note indicated plan for physical therapy 6 weeks prior to MRI ordering.    PERTINENT HISTORY: ZOX:WRUEAVW, DM,HTN, Rt hip OA (has THA scheduled 03/14/23)  PAIN:  NPRS scale: at worst 2/10 Pain location: Rt shoulder pain Pain description: achy/tightness Aggravating factors: reaching overhead/shoulder height Relieving factors: some light exercises/stretching   PRECAUTIONS: None  WEIGHT BEARING RESTRICTIONS: No  FALLS:  Has patient fallen in last 6 months? No  LIVING ENVIRONMENT: Lives in: House/apartment  OCCUPATION: Retired   PLOF: Independent, Rt hand dominant.   Housecleaning, trash cans out and back to curb  PATIENT GOALS:  Reduce pain, being able to use arm above head.   OBJECTIVE:   PATIENT SURVEYS:  12/30/2022 FOTO intake:  51   predicted:  66  COGNITION: 12/30/2022 Overall cognitive status:  WFL     SENSATION: 12/30/2022 WFL to light touch  POSTURE: 12/30/2022  Rounded shoulders, FHP  UPPER EXTREMITY ROM:   ROM Right 12/30/2022 Left 12/30/2022  Shoulder flexion 70 deg AROM against gravity in sitting with shrug noted  Supine AROM 100 deg [facility]  Shoulder extension    Shoulder abduction 45 deg AROM against gravity in sitting with shrug noted   Shoulder adduction    Shoulder internal rotation    Shoulder external rotation 45 deg PROM in 45 dg abduction supine with muscle guarding   Elbow flexion    Elbow extension    Wrist flexion    Wrist extension    Wrist ulnar deviation    Wrist radial deviation    Wrist pronation    Wrist supination    (Blank rows = not tested)  UPPER EXTREMITY MMT:  MMT Right 12/30/2022 Left 12/30/2022  Shoulder flexion 2/5 5/5  Shoulder extension    Shoulder abduction 2/5 5/5  Shoulder adduction    Shoulder internal rotation 5/5 5/5  Shoulder external rotation 2+/5 5/5  Middle trapezius    Lower trapezius    Elbow flexion 4/5 5/5  Elbow extension 5/5 5/5  Wrist flexion    Wrist extension    Wrist ulnar deviation    Wrist radial deviation    Wrist pronation    Wrist supination    Grip strength (lbs)    (Blank rows = not tested)  SHOULDER SPECIAL TESTS: 12/30/2022 (+) Shrug, drop arm on Rt shoulder   JOINT MOBILITY TESTING:  12/30/2022 No specific limitation in passive accessory movement Rt shoulder mid range in all directions.   PALPATION:  12/30/2022 Tightness in Rt upper trap.  TODAY'S TREATMENT:                                                                                                        DATE:01/10/2023 Therex: UBE L2 for 5 min switch directions halfway Seated scapular retractions 5 sec hold X 10 Seated Rt shoulder ER  isometrics 5 sec hold X 10 Seated Rt shoulder abd isometrics 5 sec hold X 10 Seated shoulder flexion AAROM flexion using cane 5 sec hold at top X 10 Seated hammer curl 2# reps of 7, 3 Seated UE ranger that was positioned on floor X 10 reps eachfor AAROM flexion/extension, horizontal abd/add, circles CW and CCW  DATE:12/30/2022 Therex:    HEP instruction/performance c cues for techniques, handout provided.  Trial set performed of each for comprehension and symptom assessment.  See below for exercise list   PATIENT EDUCATION: 12/30/2022 Education details: HEP, POC Person educated: Patient Education method: Explanation, Demonstration, Verbal cues, and Handouts Education comprehension: verbalized understanding, returned demonstration, and verbal cues required  HOME EXERCISE PROGRAM: Access Code: [facility] URL: [email or web address] Date: 12/30/2022 Prepared by: Chyrel Masson  Exercises - Seated Scapular Retraction  - 3-5 x daily - 7 x weekly - 1 sets - 10 reps - 3-5 hold - Supine Shoulder Flexion Extension AAROM with Dowel  - 1-2 x daily - 7 x weekly - 2-3 sets - 10-15 reps - 3 hold - Seated Shoulder Flexion AAROM with Dowel  - 1-2 x daily - 7 x weekly - 2-3 sets - 10-15 reps - 2 hold - Standing Isometric Shoulder External Rotation with Doorway (Mirrored)  - 2-3 x daily - 7 x weekly - 1 sets - 10 reps - 5 hold  ASSESSMENT:  CLINICAL IMPRESSION: He has been compliant with HEP and showed good understanding of them. He is also showing overall less pain in his right shoulder. He will continue to benefit from further strength and ROM work to improve functional use of Rt UE.   OBJECTIVE IMPAIRMENTS: Abnormal gait, decreased activity tolerance, decreased balance, decreased coordination, decreased endurance, decreased mobility, difficulty walking, decreased ROM, decreased strength, impaired perceived functional ability, impaired flexibility, impaired UE functional use, improper  body mechanics, postural dysfunction, and pain.   ACTIVITY LIMITATIONS: carrying, lifting, sleeping, bathing, toileting, dressing, self feeding, reach over head, and hygiene/grooming  PARTICIPATION LIMITATIONS: meal prep, cleaning, laundry, interpersonal relationship, and community activity  PERSONAL FACTORS:  [id number], DM,HTN, Rt hip OA (has THA scheduled 03/14/23)  are also affecting patient's functional outcome.   REHAB POTENTIAL: Good  CLINICAL DECISION MAKING: Stable/uncomplicated  EVALUATION COMPLEXITY: Low   GOALS: Goals reviewed with patient? Yes  SHORT TERM GOALS: (target date for Short term goals are 3 weeks 01/20/2023)  1.Patient will demonstrate independent use of home exercise program to maintain progress from in clinic treatments. Goal status: New  LONG TERM GOALS: (target dates for all long term goals are 10 weeks  03/10/2023 )   1. Patient will demonstrate/report pain at worst less than or equal to 2/10 to facilitate minimal limitation in daily activity secondary to pain symptoms. Goal  status: New   2. Patient will demonstrate independent use of home exercise program to facilitate ability to maintain/progress functional gains from skilled physical therapy services. Goal status: New   3. Patient will demonstrate FOTO outcome > or = 66 % to indicate reduced disability due to condition. Goal status: New   4.  Patient will demonstrate Rt UE MMT 4/5 or greater throughout to facilitate lifting, reaching, carrying at [facility] in daily activity.   Goal status: New   5.  Patient will demonstrate Rt GH joint AROM WFL s symptoms to facilitate usual overhead reaching, self care, dressing at PLOF.    Goal status: New   6.  Patient will demonstrate/report ability to perform usual housework and self care at [facility].  Goal status: New    PLAN:  PT FREQUENCY: 1-2x/week  PT DURATION: 10 weeks  PLANNED INTERVENTIONS: Therapeutic exercises, Therapeutic activity, Neuro Muscular  re-education, Balance training, Gait training, Patient/Family education, Joint mobilization, Stair training, DME instructions, Dry Needling, Electrical stimulation, Traction, Cryotherapy, vasopneumatic device Moist heat, Taping, Ultrasound, Ionotophoresis 4mg /ml Dexamethasone, and aquatic therapy ,Manual therapy.  All included unless contraindicated  PLAN FOR NEXT SESSION:   Gravity reduced AAROM/AROM early strengthening as able.  Avoid shrug.   Ivery Quale, PT, DPT 01/10/23 9:27 AM

## 2023-01-11 ENCOUNTER — Encounter (INDEPENDENT_AMBULATORY_CARE_PROVIDER_SITE_OTHER): Payer: Self-pay | Admitting: Family Medicine

## 2023-01-11 ENCOUNTER — Ambulatory Visit (INDEPENDENT_AMBULATORY_CARE_PROVIDER_SITE_OTHER): Payer: BC Managed Care – PPO | Admitting: Family Medicine

## 2023-01-11 VITALS — BP 130/71 | HR 74 | Temp 97.8°F | Ht 69.0 in | Wt 278.0 lb

## 2023-01-11 DIAGNOSIS — Z6841 Body Mass Index (BMI) 40.0 and over, adult: Secondary | ICD-10-CM | POA: Diagnosis not present

## 2023-01-11 DIAGNOSIS — E669 Obesity, unspecified: Secondary | ICD-10-CM | POA: Diagnosis not present

## 2023-01-11 DIAGNOSIS — Z7984 Long term (current) use of oral hypoglycemic drugs: Secondary | ICD-10-CM

## 2023-01-11 DIAGNOSIS — E559 Vitamin D deficiency, unspecified: Secondary | ICD-10-CM

## 2023-01-11 DIAGNOSIS — E1165 Type 2 diabetes mellitus with hyperglycemia: Secondary | ICD-10-CM | POA: Diagnosis not present

## 2023-01-11 MED ORDER — VITAMIN D (ERGOCALCIFEROL) 1.25 MG (50000 UNIT) PO CAPS
50000.0000 [IU] | ORAL_CAPSULE | ORAL | 0 refills | Status: DC
Start: 2023-01-11 — End: 2023-06-30

## 2023-01-11 NOTE — Progress Notes (Signed)
Chief Complaint:   OBESITY Richard [name] is here to discuss his progress with his obesity treatment plan along with follow-up of his obesity related diagnoses. Richard [name] is on the Category 3 Plan and states he is following his eating plan approximately 50% of the time. Richard [name] states he is doing 0 minutes 0 times per week.  Today's visit was #: 9 Starting weight: 310 lbs Starting date: 06/22/2022 Today's weight: 278 lbs Today's date: 01/11/2023 Total lbs lost to date: 32 Total lbs lost since last in-office visit: 0  Interim History: Patient hasn't really felt well over the last month.  He has dealt with some constipation, chills, appetite changes.  He is missing meals and making other choices for food aside from meal plan.  He is dealing with some sciatica type symptoms.   Subjective:   1. Vitamin D deficiency Patient is on prescription vitamin D.  He denies nausea, vomiting, or muscle weakness but notes fatigue.  2. Type 2 diabetes mellitus with hyperglycemia, without long-term current use of insulin (HCC) Patient is on metformin twice daily.  His last A1c was 6.0 (prior 5.9).  He has tried [place] previously.  Assessment/Plan:   1. Vitamin D deficiency Patient will continue prescription vitamin D once weekly, we will refill for 1 month.  - Vitamin D, Ergocalciferol, (DRISDOL) 1.25 MG (50000 UNIT) CAPS capsule; Take 1 capsule (50,000 Units total) by mouth every 7 (seven) days.  Dispense: 4 capsule; Refill: 0  2. Type 2 diabetes mellitus with hyperglycemia, without long-term current use of insulin (HCC) Patient will continue metformin twice daily with no change in dose or medication.  3. BMI 40.0-44.9, adult (HCC)  4. Obesity with starting BMI of 45.9 Richard [name] is currently in the action stage of change. As such, his goal is to continue with weight loss efforts. He has agreed to the Category 3 Plan.   Exercise goals: No exercise has been prescribed at this time.  Behavioral  modification strategies: increasing lean protein intake, meal planning and cooking strategies, keeping healthy foods in the home, and planning for success.  Richard [name] has agreed to follow-up with our clinic in 3 to 4 weeks. He was informed of the importance of frequent follow-up visits to maximize his success with intensive lifestyle modifications for his multiple health conditions.   Objective:   Blood pressure 130/71, pulse 74, temperature 97.8 F (36.6 C), height 5\' 9"  (1.753 m), weight 278 lb (126.1 kg), SpO2 98%. Body mass index is 41.05 kg/m.  General: Cooperative, alert, well developed, in no acute distress. HEENT: Conjunctivae and lids unremarkable. Cardiovascular: Regular rhythm.  Lungs: Normal work of breathing. Neurologic: No focal deficits.   Lab Results  Component Value Date   CREATININE 0.90 11/30/2022   BUN 19 11/30/2022   NA 136 11/30/2022   K 4.5 11/30/2022   CL 96 11/30/2022   CO2 28 11/30/2022   Lab Results  Component Value Date   ALT 11 11/30/2022   AST 12 11/30/2022   ALKPHOS 57 11/30/2022   BILITOT 1.3 (H) 11/30/2022   Lab Results  Component Value Date   HGBA1C 6.0 11/30/2022   HGBA1C 5.9 (H) 05/21/2022   HGBA1C 6.4 12/01/2021   HGBA1C 6.5 06/02/2021   HGBA1C 6.4 09/25/2020   No results found for: "INSULIN" Lab Results  Component Value Date   TSH 0.40 11/30/2022   Lab Results  Component Value Date   CHOL 170 11/30/2022   HDL 77.00 11/30/2022   LDLCALC 80 11/30/2022  LDLDIRECT 133.0 12/05/2017   TRIG 64.0 11/30/2022   CHOLHDL 2 11/30/2022   Lab Results  Component Value Date   VD25OH 39.18 11/30/2022   VD25OH 13 (L) 05/21/2022   Lab Results  Component Value Date   WBC 8.9 11/30/2022   HGB 14.2 11/30/2022   HCT 43.7 11/30/2022   MCV 100.0 11/30/2022   PLT 317.0 11/30/2022   No results found for: "IRON", "TIBC", "FERRITIN"  Attestation Statements:   Reviewed by clinician on day of visit: allergies, medications, problem list,  medical history, surgical history, family history, social history, and previous encounter notes.   I, Burt Knack, am acting as transcriptionist for Reuben Likes, MD.  I have reviewed the above documentation for accuracy and completeness, and I agree with the above. - Reuben Likes, MD

## 2023-01-12 ENCOUNTER — Encounter: Payer: Self-pay | Admitting: Rehabilitative and Restorative Service Providers"

## 2023-01-12 ENCOUNTER — Ambulatory Visit: Payer: BC Managed Care – PPO | Admitting: Rehabilitative and Restorative Service Providers"

## 2023-01-12 DIAGNOSIS — M25511 Pain in right shoulder: Secondary | ICD-10-CM | POA: Diagnosis not present

## 2023-01-12 DIAGNOSIS — M6281 Muscle weakness (generalized): Secondary | ICD-10-CM

## 2023-01-12 DIAGNOSIS — G8929 Other chronic pain: Secondary | ICD-10-CM

## 2023-01-12 DIAGNOSIS — R293 Abnormal posture: Secondary | ICD-10-CM

## 2023-01-12 NOTE — Therapy (Signed)
OUTPATIENT PHYSICAL THERAPY  TREATMENT   Patient Name: Richard [name] MRN: [id number] DOB:1950/08/28, 72 y.o., male Today's Date: 01/12/2023  END OF SESSION:  PT End of Session - 01/12/23 0841     Visit Number 3    Number of Visits 20    Date for PT Re-Evaluation 03/10/23    Authorization Type [employer] state $52 copay    Progress Note Due on Visit 10    PT Start Time [phone]    PT Stop Time 0920    PT Time Calculation (min) 39 min    Activity Tolerance Patient tolerated treatment well    Behavior During Therapy [facility] for tasks assessed/performed              Past Medical History:  Diagnosis Date   Allergy    seasonal   Arthritis     left knee,  right hip   Back pain    Bilateral swelling of feet    Complication of anesthesia    half awake during last colonscopy   Constipation    Diabetes mellitus    type 2   Hx of adenomatous colonic polyps    2010 6 mm adenoma 10/06/2016 2 5 mm descending polyps    Hyperlipidemia    Hypertension    Joint pain    Lactose intolerance    Past Surgical History:  Procedure Laterality Date   COLONOSCOPY     COLONOSCOPY WITH PROPOFOL N/A 10/06/2016   Procedure: COLONOSCOPY WITH PROPOFOL;  Surgeon: [name], MD;  Location: [facility];  Service: Endoscopy;  Laterality: N/A;   Patient Active Problem List   Diagnosis Date Noted   Right shoulder pain 02/25/2022   Acute right otitis media 02/25/2022   Morbid obesity (HCC) 02/25/2022   Controlled type 2 diabetes mellitus with complication, without long-term current use of insulin (HCC) 06/02/2021   Cough 12/30/2020   Uncontrolled type 2 diabetes mellitus with hyperglycemia (HCC) 02/21/2020   Right hip pain 08/16/2018   Viral upper respiratory tract infection 03/10/2017   Hx of adenomatous colonic polyps    Benign neoplasm of descending colon    Primary hypertension 09/06/2016   Tooth abscess 09/01/2015   Sinusitis 11/08/2014   Sinusitis, acute 10/28/2014   Trigger ring finger of  right hand 02/06/2013   Suspicious mole 02/06/2013   Avulsion of toenail 12/11/2010   Acute sinusitis 08/17/2010   WOUND, FINGER 07/10/2010   SCIATICA, LEFT 09/02/2009   UNSPECIFIED PERIPHERAL VASCULAR DISEASE 07/04/2009   Hyperlipidemia LDL goal <70 05/05/2009   HEARTBURN 04/22/2009   DM (diabetes mellitus) type II uncontrolled, periph vascular disorder (HCC) 01/23/2009    PCP: [name] DO  REFERRING PROVIDER: Tarry Kos, MD  REFERRING DIAG: [phone] (ICD-10-CM) - Chronic right shoulder pain  THERAPY DIAG:  Chronic right shoulder pain  Muscle weakness (generalized)  Abnormal posture  Rationale for Evaluation and Treatment: Rehabilitation  ONSET DATE: May 2024  SUBJECTIVE:  SUBJECTIVE STATEMENT: Reported having increase muscle soreness and fatigue, noted in both arms.    PERTINENT HISTORY: [id number], DM,HTN, Rt hip OA (has THA scheduled 03/14/23)  PAIN:  NPRS scale: 4/10 upon arrival.   Pain location: Rt shoulder pain Pain description: achy/tightness Aggravating factors: reaching overhead/shoulder height Relieving factors: some light exercises/stretching   PRECAUTIONS: None  WEIGHT BEARING RESTRICTIONS: No  FALLS:  Has patient fallen in last 6 months? No  LIVING ENVIRONMENT: Lives in: House/apartment  OCCUPATION: Retired   PLOF: Independent, Rt hand dominant.   Housecleaning, trash cans out and back to curb  PATIENT GOALS:  Reduce pain, being able to use arm above head.   OBJECTIVE:   PATIENT SURVEYS:  12/30/2022 FOTO intake:  51   predicted:  66  COGNITION: 12/30/2022 Overall cognitive status: WFL     SENSATION: 12/30/2022 WFL to light touch  POSTURE: 12/30/2022  Rounded shoulders, FHP  UPPER EXTREMITY ROM:   ROM Right 12/30/2022  Left 12/30/2022  Shoulder flexion 70 deg AROM against gravity in sitting with shrug noted  Supine AROM 100 deg [facility]  Shoulder extension    Shoulder abduction 45 deg AROM against gravity in sitting with shrug noted   Shoulder adduction    Shoulder internal rotation    Shoulder external rotation 45 deg PROM in 45 dg abduction supine with muscle guarding   Elbow flexion    Elbow extension    Wrist flexion    Wrist extension    Wrist ulnar deviation    Wrist radial deviation    Wrist pronation    Wrist supination    (Blank rows = not tested)  UPPER EXTREMITY MMT:  MMT Right 12/30/2022 Left 12/30/2022 Right 01/12/2023  Shoulder flexion 2/5 5/5   Shoulder extension     Shoulder abduction 2/5 5/5   Shoulder adduction     Shoulder internal rotation 5/5 5/5   Shoulder external rotation 2+/5 5/5 3+/5  Middle trapezius     Lower trapezius     Elbow flexion 4/5 5/5   Elbow extension 5/5 5/5   Wrist flexion     Wrist extension     Wrist ulnar deviation     Wrist radial deviation     Wrist pronation     Wrist supination     Grip strength (lbs)     (Blank rows = not tested)  SHOULDER SPECIAL TESTS: 12/30/2022 (+) Shrug, drop arm on Rt shoulder   JOINT MOBILITY TESTING:  12/30/2022 No specific limitation in passive accessory movement Rt shoulder mid range in all directions.   PALPATION:  12/30/2022 Tightness in Rt upper trap.  TODAY'S TREATMENT:                                                                                                       DATE: 01/12/2023 Therex: UBE UE only Lvl 2 3 mins fwd, back each way with 1 min rest between  Seated Rt shoulder ER isometrics 5 sec on/off x 12 Seated shoulder flexion AAROM flexion using cane to approx. 90 deg flexion x 10  Seated green band  rows bilateral 2 x 15 Seated Rt shoulder UE range on flexion to approx. 100 deg flexion x 15, scaption to approx. 100 deg x 15 Seated Rt shoulder IR with towel under arm green band 2 x 10     TODAY'S TREATMENT:                                                                                                       DATE:01/10/2023 Therex: UBE L2 for 5 min switch directions halfway Seated scapular retractions 5 sec hold X 10 Seated Rt shoulder ER isometrics 5 sec hold X 10 Seated Rt shoulder abd isometrics 5 sec hold X 10 Seated shoulder flexion AAROM flexion using cane 5 sec hold at top X 10 Seated hammer curl 2# reps of 7, 3 Seated UE ranger that was positioned on floor X 10 reps eachfor AAROM flexion/extension, horizontal abd/add, circles CW and CCW  TODAY'S TREATMENT:                                                                                                       DATE:12/30/2022 Therex:    HEP instruction/performance c cues for techniques, handout provided.  Trial set performed of each for comprehension and symptom assessment.  See below for exercise list   PATIENT EDUCATION: 12/30/2022 Education details: HEP, POC Person educated: Patient Education method: Explanation, Demonstration, Verbal cues, and Handouts Education comprehension: verbalized understanding, returned demonstration, and verbal cues required  HOME EXERCISE PROGRAM: Access Code: [facility] URL: [email or web address] Date: 12/30/2022 Prepared by: Chyrel Masson  Exercises - Seated Scapular Retraction  - 3-5 x daily - 7 x weekly - 1 sets - 10 reps - 3-5 hold - Supine Shoulder Flexion Extension AAROM with Dowel  - 1-2 x daily - 7 x  weekly - 2-3 sets - 10-15 reps - 3 hold - Seated Shoulder Flexion AAROM with Dowel  - 1-2 x daily - 7 x weekly - 2-3 sets - 10-15 reps - 2 hold - Standing Isometric Shoulder External Rotation with Doorway (Mirrored)  - 2-3 x daily - 7 x weekly - 1 sets - 10 reps - 5  hold  ASSESSMENT:  CLINICAL IMPRESSION: Pt has demonstrated some mild improvements in muscle recruitment and activation.  Limitations still noted in functional reach against gravity with Rt arm.  Encouraged continued AAROM/AROM training as tolerated avoiding shrug.   OBJECTIVE IMPAIRMENTS: Abnormal gait, decreased activity tolerance, decreased balance, decreased coordination, decreased endurance, decreased mobility, difficulty walking, decreased ROM, decreased strength, impaired perceived functional ability, impaired flexibility, impaired UE functional use, improper body mechanics, postural dysfunction, and pain.   ACTIVITY LIMITATIONS: carrying, lifting, sleeping, bathing, toileting, dressing, self feeding, reach over head, and hygiene/grooming  PARTICIPATION LIMITATIONS: meal prep, cleaning, laundry, interpersonal relationship, and community activity  PERSONAL FACTORS:  [id number], DM,HTN, Rt hip OA (has THA scheduled 03/14/23)  are also affecting patient's functional outcome.   REHAB POTENTIAL: Good  CLINICAL DECISION MAKING: Stable/uncomplicated  EVALUATION COMPLEXITY: Low   GOALS: Goals reviewed with patient? Yes  SHORT TERM GOALS: (target date for Short term goals are 3 weeks 01/20/2023)  1.Patient will demonstrate independent use of home exercise program to maintain progress from in clinic treatments. Goal status: on going 01/12/2023  LONG TERM GOALS: (target dates for all long term goals are 10 weeks  03/10/2023 )   1. Patient will demonstrate/report pain at worst less than or equal to 2/10 to facilitate minimal limitation in daily activity secondary to pain symptoms. Goal status: New   2. Patient will demonstrate independent use of home exercise program to facilitate ability to maintain/progress functional gains from skilled physical therapy services. Goal status: New   3. Patient will demonstrate FOTO outcome > or = 66 % to indicate reduced disability due to  condition. Goal status: New   4.  Patient will demonstrate Rt UE MMT 4/5 or greater throughout to facilitate lifting, reaching, carrying at [facility] in daily activity.   Goal status: New   5.  Patient will demonstrate Rt GH joint AROM WFL s symptoms to facilitate usual overhead reaching, self care, dressing at PLOF.    Goal status: New   6.  Patient will demonstrate/report ability to perform usual housework and self care at [facility].  Goal status: New    PLAN:  PT FREQUENCY: 1-2x/week  PT DURATION: 10 weeks  PLANNED INTERVENTIONS: Therapeutic exercises, Therapeutic activity, Neuro Muscular re-education, Balance training, Gait training, Patient/Family education, Joint mobilization, Stair training, DME instructions, Dry Needling, Electrical stimulation, Traction, Cryotherapy, vasopneumatic device Moist heat, Taping, Ultrasound, Ionotophoresis 4mg /ml Dexamethasone, and aquatic therapy ,Manual therapy.  All included unless contraindicated  PLAN FOR NEXT SESSION:   Continue with AAROM/AROM gains.  Be mindful of post activity soreness response.    Chyrel Masson, PT, DPT, OCS, ATC 01/12/23  9:17 AM

## 2023-01-17 ENCOUNTER — Encounter: Payer: BC Managed Care – PPO | Admitting: Physical Therapy

## 2023-01-18 ENCOUNTER — Ambulatory Visit: Payer: BC Managed Care – PPO | Admitting: Family Medicine

## 2023-01-18 ENCOUNTER — Encounter: Payer: Self-pay | Admitting: Family Medicine

## 2023-01-18 VITALS — BP 160/70 | HR 95 | Temp 98.2°F | Resp 18 | Ht 69.0 in | Wt 282.4 lb

## 2023-01-18 DIAGNOSIS — T402X5A Adverse effect of other opioids, initial encounter: Secondary | ICD-10-CM

## 2023-01-18 DIAGNOSIS — J029 Acute pharyngitis, unspecified: Secondary | ICD-10-CM

## 2023-01-18 DIAGNOSIS — R051 Acute cough: Secondary | ICD-10-CM

## 2023-01-18 DIAGNOSIS — K5903 Drug induced constipation: Secondary | ICD-10-CM

## 2023-01-18 MED ORDER — AMOXICILLIN-POT CLAVULANATE 875-125 MG PO TABS
1.0000 | ORAL_TABLET | Freq: Two times a day (BID) | ORAL | 0 refills | Status: DC
Start: 2023-01-18 — End: 2023-02-01

## 2023-01-18 MED ORDER — PROMETHAZINE-DM 6.25-15 MG/5ML PO SYRP: 5.0000 mL | ORAL_SOLUTION | Freq: Four times a day (QID) | ORAL | 0 refills | Status: DC | PRN

## 2023-01-18 MED ORDER — LUBIPROSTONE 24 MCG PO CAPS
24.0000 ug | ORAL_CAPSULE | Freq: Two times a day (BID) | ORAL | 5 refills | Status: DC
Start: 2023-01-18 — End: 2023-05-06

## 2023-01-18 NOTE — Progress Notes (Signed)
Established Patient Office Visit  Subjective   Patient ID: Richard [name], male    DOB: 11/08/1950  Age: 72 y.o. MRN: [id number]  Chief Complaint  Patient presents with   Sore Throat    Sxs started yesterday afternoon, swollen throat glands, coughing fits.     HPI Discussed the use of AI scribe software for clinical note transcription with the patient, who gave verbal consent to proceed.  History of Present Illness   The patient presents with a sore throat and cough, which he attributes to an illness contracted from his wife who recently returned from [place], [place]. He describes his glands as feeling swollen and has been coughing up green mucus. He denies fever and body aches. He also reports a new issue of nocturnal leg pain, which he describes as an ache that extends from the back of his leg down to his ankle. He does not believe this pain is related to his hip. He also reports constipation, which he believes may be related to his use of oxycodone for pain management.      Patient Active Problem List   Diagnosis Date Noted   Right shoulder pain 02/25/2022   Acute right otitis media 02/25/2022   Morbid obesity (HCC) 02/25/2022   Controlled type 2 diabetes mellitus with complication, without long-term current use of insulin (HCC) 06/02/2021   Cough 12/30/2020   Uncontrolled type 2 diabetes mellitus with hyperglycemia (HCC) 02/21/2020   Right hip pain 08/16/2018   Viral upper respiratory tract infection 03/10/2017   Hx of adenomatous colonic polyps    Benign neoplasm of descending colon    Primary hypertension 09/06/2016   Tooth abscess 09/01/2015   Sinusitis 11/08/2014   Sinusitis, acute 10/28/2014   Trigger ring finger of right hand 02/06/2013   Suspicious mole 02/06/2013   Avulsion of toenail 12/11/2010   Acute sinusitis 08/17/2010   WOUND, FINGER 07/10/2010   SCIATICA, LEFT 09/02/2009   UNSPECIFIED PERIPHERAL VASCULAR DISEASE 07/04/2009   Hyperlipidemia LDL goal <70  05/05/2009   HEARTBURN 04/22/2009   DM (diabetes mellitus) type II uncontrolled, periph vascular disorder (HCC) 01/23/2009   Past Medical History:  Diagnosis Date   Allergy    seasonal   Arthritis     left knee,  right hip   Back pain    Bilateral swelling of feet    Complication of anesthesia    half awake during last colonscopy   Constipation    Diabetes mellitus    type 2   Hx of adenomatous colonic polyps    2010 6 mm adenoma 10/06/2016 2 5 mm descending polyps    Hyperlipidemia    Hypertension    Joint pain    Lactose intolerance    Past Surgical History:  Procedure Laterality Date   COLONOSCOPY     COLONOSCOPY WITH PROPOFOL N/A 10/06/2016   Procedure: COLONOSCOPY WITH PROPOFOL;  Surgeon: [name], MD;  Location: [facility];  Service: Endoscopy;  Laterality: N/A;   Social History   Tobacco Use   Smoking status: Never   Smokeless tobacco: Never  Substance Use Topics   Alcohol use: Yes    Comment: occasionally    Drug use: No   Social History   Socioeconomic History   Marital status: Married    Spouse name: Not on file   Number of children: Not on file   Years of education: Not on file   Highest education level: Not on file  Occupational History   Not  on file  Tobacco Use   Smoking status: Never   Smokeless tobacco: Never  Substance and Sexual Activity   Alcohol use: Yes    Comment: occasionally    Drug use: No   Sexual activity: Not on file  Other Topics Concern   Not on file  Social History Narrative   Not on file   Social Determinants of Health   Financial Resource Strain: Not on file  Food Insecurity: Not on file  Transportation Needs: Not on file  Physical Activity: Not on file  Stress: Not on file  Social Connections: Not on file  Intimate Partner Violence: Not on file   Family Status  Relation Name Status   Mother  (Not Specified)   Sister  (Not Specified)   Brother  (Not Specified)   Neg Hx  (Not Specified)  No  partnership data on file   Family History  Problem Relation Age of Onset   Hyperlipidemia Mother    Hypertension Mother    Diabetes Mother    Diabetes Sister    Hypertension Sister    Stroke Sister    Diabetes Brother    Colon cancer Neg Hx    Colon polyps Neg Hx    Rectal cancer Neg Hx    Stomach cancer Neg Hx    Esophageal cancer Neg Hx    Allergies  Allergen Reactions   Semaglutide Other (See Comments)    RYBELSUS. Severe headaches.       Review of Systems  Constitutional:  Negative for fever and malaise/fatigue.  HENT:  Positive for sore throat. Negative for congestion and sinus pain.   Eyes:  Negative for blurred vision.  Respiratory:  Positive for cough and sputum production. Negative for shortness of breath.   Cardiovascular:  Negative for chest pain, palpitations and leg swelling.  Gastrointestinal:  Negative for abdominal pain, blood in stool, nausea and vomiting.  Genitourinary:  Negative for dysuria and frequency.  Musculoskeletal:  Negative for back pain and falls.  Skin:  Negative for rash.  Neurological:  Negative for dizziness, loss of consciousness and headaches.  Endo/Heme/Allergies:  Negative for environmental allergies.  Psychiatric/Behavioral:  Negative for depression. The patient is not nervous/anxious.       Objective:     BP (!) 160/70 (BP Location: Left Arm, Patient Position: Sitting, Cuff Size: Large)   Pulse 95   Temp 98.2 F (36.8 C) (Oral)   Resp 18   Ht 5\' 9"  (1.753 m)   Wt 282 lb 6.4 oz (128.1 kg)   SpO2 96%   BMI 41.70 kg/m  BP Readings from Last 3 Encounters:  01/18/23 (!) 160/70  01/11/23 130/71  12/06/22 134/69   Wt Readings from Last 3 Encounters:  01/18/23 282 lb 6.4 oz (128.1 kg)  01/11/23 278 lb (126.1 kg)  12/06/22 273 lb (123.8 kg)   SpO2 Readings from Last 3 Encounters:  01/18/23 96%  01/11/23 98%  12/06/22 98%      Physical Exam Vitals and nursing note reviewed.  Constitutional:      General: He is not  in acute distress.    Appearance: Normal appearance. He is well-developed.  HENT:     Head: Normocephalic and atraumatic.     Nose: No congestion.     Mouth/Throat:     Pharynx: Posterior oropharyngeal erythema present. No oropharyngeal exudate.  Eyes:     General: No scleral icterus.       Right eye: No discharge.  Left eye: No discharge.  Cardiovascular:     Rate and Rhythm: Normal rate and regular rhythm.     Heart sounds: No murmur heard. Pulmonary:     Effort: Pulmonary effort is normal. No respiratory distress.     Breath sounds: Normal breath sounds.  Musculoskeletal:        General: Normal range of motion.     Cervical back: Normal range of motion and neck supple.     Right lower leg: No edema.     Left lower leg: No edema.  Lymphadenopathy:     Cervical: Cervical adenopathy present.  Skin:    General: Skin is warm and dry.  Neurological:     Mental Status: He is alert and oriented to person, place, and time.  Psychiatric:        Mood and Affect: Mood normal.        Behavior: Behavior normal.        Thought Content: Thought content normal.        Judgment: Judgment normal.      No results found for any visits on 01/18/23.  Last CBC Lab Results  Component Value Date   WBC 8.9 11/30/2022   HGB 14.2 11/30/2022   HCT 43.7 11/30/2022   MCV 100.0 11/30/2022   MCH 32.5 05/21/2022   RDW 13.3 11/30/2022   PLT 317.0 11/30/2022   Last metabolic panel Lab Results  Component Value Date   GLUCOSE 91 11/30/2022   NA 136 11/30/2022   K 4.5 11/30/2022   CL 96 11/30/2022   CO2 28 11/30/2022   BUN 19 11/30/2022   CREATININE 0.90 11/30/2022   GFR 86.92 11/30/2022   CALCIUM 9.8 11/30/2022   PROT 7.9 11/30/2022   ALBUMIN 4.5 11/30/2022   BILITOT 1.3 (H) 11/30/2022   ALKPHOS 57 11/30/2022   AST 12 11/30/2022   ALT 11 11/30/2022   Last lipids Lab Results  Component Value Date   CHOL 170 11/30/2022   HDL 77.00 11/30/2022   LDLCALC 80 11/30/2022    LDLDIRECT 133.0 12/05/2017   TRIG 64.0 11/30/2022   CHOLHDL 2 11/30/2022   Last hemoglobin A1c Lab Results  Component Value Date   HGBA1C 6.0 11/30/2022   Last thyroid functions Lab Results  Component Value Date   TSH 0.40 11/30/2022   T3TOTAL 90 06/22/2022   Last vitamin D Lab Results  Component Value Date   VD25OH 39.18 11/30/2022   Last vitamin B12 and Folate Lab Results  Component Value Date   VITAMINB12 121 (L) 11/30/2022   FOLATE 9.6 06/22/2022      The 10-year ASCVD risk score (Arnett DK, et al., 2019) is: 40.1%    Assessment & Plan:   Problem List Items Addressed This Visit       Unprioritized   Cough   Relevant Medications   promethazine-dextromethorphan (PROMETHAZINE-DM) 6.25-15 MG/5ML syrup   Other Visit Diagnoses     Pharyngitis, unspecified etiology    -  Primary   Relevant Medications   amoxicillin-clavulanate (AUGMENTIN) 875-125 MG tablet   Therapeutic opioid-induced constipation (OIC)       Relevant Medications   lubiprostone (AMITIZA) 24 MCG capsule     Assessment and Plan    Strep Throat Sore throat, swollen glands, and green mucus. Strep test positive. No fever or body aches. COVID test negative. -Prescribe antibiotic.  Constipation Likely secondary to oxycodone use. Over-the-counter remedies not sufficient. -Prescribe Amitiza.  Sciatica Nighttime leg pain, likely secondary to sciatica. -Continue current management.  General Health Maintenance -Continue monitoring blood sugar levels. -Consider flu shot when available.        No follow-ups on file.    [name], DO

## 2023-01-19 ENCOUNTER — Encounter: Payer: BC Managed Care – PPO | Admitting: Physical Therapy

## 2023-01-24 ENCOUNTER — Ambulatory Visit: Payer: BC Managed Care – PPO | Admitting: Rehabilitative and Restorative Service Providers"

## 2023-01-24 ENCOUNTER — Encounter: Payer: Self-pay | Admitting: Rehabilitative and Restorative Service Providers"

## 2023-01-24 DIAGNOSIS — R293 Abnormal posture: Secondary | ICD-10-CM | POA: Diagnosis not present

## 2023-01-24 DIAGNOSIS — M6281 Muscle weakness (generalized): Secondary | ICD-10-CM | POA: Diagnosis not present

## 2023-01-24 DIAGNOSIS — M25511 Pain in right shoulder: Secondary | ICD-10-CM

## 2023-01-24 DIAGNOSIS — G8929 Other chronic pain: Secondary | ICD-10-CM | POA: Diagnosis not present

## 2023-01-24 NOTE — Therapy (Signed)
OUTPATIENT PHYSICAL THERAPY  TREATMENT   Patient Name: Richard [name] MRN: [id number] DOB:Oct 13, 1950, 72 y.o., male Today's Date: 01/24/2023  END OF SESSION:  PT End of Session - 01/24/23 0848     Visit Number 4    Number of Visits 20    Date for PT Re-Evaluation 03/10/23    Authorization Type BCBS state $52 copay    Progress Note Due on Visit 10    PT Start Time 0840    PT Stop Time 0920    PT Time Calculation (min) 40 min    Activity Tolerance Patient limited by fatigue    Behavior During Therapy [facility] for tasks assessed/performed               Past Medical History:  Diagnosis Date   Allergy    seasonal   Arthritis     left knee,  right hip   Back pain    Bilateral swelling of feet    Complication of anesthesia    half awake during last colonscopy   Constipation    Diabetes mellitus    type 2   Hx of adenomatous colonic polyps    2010 6 mm adenoma 10/06/2016 2 5 mm descending polyps    Hyperlipidemia    Hypertension    Joint pain    Lactose intolerance    Past Surgical History:  Procedure Laterality Date   COLONOSCOPY     COLONOSCOPY WITH PROPOFOL N/A 10/06/2016   Procedure: COLONOSCOPY WITH PROPOFOL;  Surgeon: [name], MD;  Location: [facility];  Service: Endoscopy;  Laterality: N/A;   Patient Active Problem List   Diagnosis Date Noted   Right shoulder pain 02/25/2022   Acute right otitis media 02/25/2022   Morbid obesity (HCC) 02/25/2022   Controlled type 2 diabetes mellitus with complication, without long-term current use of insulin (HCC) 06/02/2021   Cough 12/30/2020   Uncontrolled type 2 diabetes mellitus with hyperglycemia (HCC) 02/21/2020   Right hip pain 08/16/2018   Viral upper respiratory tract infection 03/10/2017   Hx of adenomatous colonic polyps    Benign neoplasm of descending colon    Primary hypertension 09/06/2016   Tooth abscess 09/01/2015   Sinusitis 11/08/2014   Sinusitis, acute 10/28/2014   Trigger ring finger of  right hand 02/06/2013   Suspicious mole 02/06/2013   Avulsion of toenail 12/11/2010   Acute sinusitis 08/17/2010   WOUND, FINGER 07/10/2010   SCIATICA, LEFT 09/02/2009   UNSPECIFIED PERIPHERAL VASCULAR DISEASE 07/04/2009   Hyperlipidemia LDL goal <70 05/05/2009   HEARTBURN 04/22/2009   DM (diabetes mellitus) type II uncontrolled, periph vascular disorder (HCC) 01/23/2009    PCP: [name] DO  REFERRING PROVIDER: Tarry Kos, MD  REFERRING DIAG: [phone] (ICD-10-CM) - Chronic right shoulder pain  THERAPY DIAG:  Chronic right shoulder pain  Muscle weakness (generalized)  Abnormal posture  Rationale for Evaluation and Treatment: Rehabilitation  ONSET DATE: May 2024  SUBJECTIVE:  SUBJECTIVE STATEMENT: Pt indicated having illness over last week that has impacted general fatigue and activity.  Pt reported the down the arm symptoms and shoulder blade pain are continued to be better.    PERTINENT HISTORY: [id number], DM,HTN, Rt hip OA (has THA scheduled 03/14/23)  PAIN:  NPRS scale: at worst 2-3/10 Pain location: Rt shoulder pain Pain description: achy/tightness Aggravating factors: reaching overhead/shoulder height Relieving factors: some light exercises/stretching   PRECAUTIONS: None  WEIGHT BEARING RESTRICTIONS: No  FALLS:  Has patient fallen in last 6 months? No  LIVING ENVIRONMENT: Lives in: House/apartment  OCCUPATION: Retired   PLOF: Independent, Rt hand dominant.   Housecleaning, trash cans out and back to curb  PATIENT GOALS:  Reduce pain, being able to use arm above head.   OBJECTIVE:   PATIENT SURVEYS:  12/30/2022 FOTO intake:  51   predicted:  66  COGNITION: 12/30/2022 Overall cognitive status: WFL     SENSATION: 12/30/2022 WFL to light  touch  POSTURE: 12/30/2022  Rounded shoulders, FHP  UPPER EXTREMITY ROM:   ROM Right 12/30/2022 Left 12/30/2022 Right 01/24/2023 Sitting   Shoulder flexion 70 deg AROM against gravity in sitting with shrug noted  Supine AROM 100 deg WFL 85 deg AROM with shrug  Shoulder extension     Shoulder abduction 45 deg AROM against gravity in sitting with shrug noted  70 deg AROM against gravity in sitting with shrug noted  Shoulder adduction     Shoulder internal rotation     Shoulder external rotation 45 deg PROM in 45 dg abduction supine with muscle guarding  45 deg AROM In sitting with arm at side  Elbow flexion     Elbow extension     Wrist flexion     Wrist extension     Wrist ulnar deviation     Wrist radial deviation     Wrist pronation     Wrist supination     (Blank rows = not tested)  UPPER EXTREMITY MMT:  MMT Right 12/30/2022 Left 12/30/2022 Right 01/12/2023 Right 01/24/2023 Left 01/24/2023  Shoulder flexion 2/5 5/5     Shoulder extension       Shoulder abduction 2/5 5/5     Shoulder adduction       Shoulder internal rotation 5/5 5/5     Shoulder external rotation 2+/5 5/5 3+/5    Middle trapezius       Lower trapezius       Elbow flexion 4/5 5/5     Elbow extension 5/5 5/5     Wrist flexion       Wrist extension       Wrist ulnar deviation       Wrist radial deviation       Wrist pronation       Wrist supination       Grip strength (lbs)    63.9 lbs 64.2, 62 lbs  (Blank rows = not tested)  SHOULDER SPECIAL TESTS: 12/30/2022 (+) Shrug, drop arm on Rt shoulder   JOINT MOBILITY TESTING:  12/30/2022 No specific limitation in passive accessory movement Rt shoulder mid range in all directions.   PALPATION:  12/30/2022 Tightness in Rt upper trap.  TODAY'S TREATMENT:                                                                                                        DATE: 01/24/2023 Therex: UBE forward 6.5 mins, back 5 mins lvl 2.5 Seated wand assist AAROM into flexion c eccentric Rt arm only x 15, x 10  Seated green band rows bilateral 2 x 15 Seated isometric GH extension into table 5 sec hold x 10 (talked about for home use) Seated red band bilateral 2 x 10    TODAY'S TREATMENT:                                                                                                       DATE: 01/12/2023 Therex: UBE UE only Lvl 2 3 mins fwd, back each way with 1 min rest between  Seated Rt shoulder ER isometrics 5 sec on/off x 12 Seated shoulder flexion AAROM flexion using cane to approx. 90 deg flexion x 10  Seated green band rows bilateral 2 x 15 Seated Rt shoulder UE range on flexion to approx. 100 deg flexion x 15, scaption to approx. 100 deg x 15 Seated Rt shoulder IR with towel under arm green band 2 x 10     TODAY'S TREATMENT:                                                                                                       DATE:01/10/2023 Therex: UBE L2 for 5 min switch directions halfway Seated scapular retractions 5 sec hold X 10 Seated Rt shoulder ER isometrics 5 sec hold X 10 Seated Rt shoulder abd isometrics 5 sec hold X 10 Seated shoulder flexion AAROM flexion using cane 5 sec hold at top X 10 Seated hammer curl 2# reps of 7, 3 Seated UE ranger that was positioned on floor X 10 reps eachfor AAROM flexion/extension, horizontal abd/add, circles CW and CCW  TODAY'S TREATMENT:  DATE:12/30/2022 Therex:    HEP instruction/performance c cues for techniques, handout provided.  Trial set performed of each for comprehension and symptom assessment.  See below for exercise list   PATIENT EDUCATION: 12/30/2022 Education details: HEP, POC Person educated:  Patient Education method: Explanation, Demonstration, Verbal cues, and Handouts Education comprehension: verbalized understanding, returned demonstration, and verbal cues required  HOME EXERCISE PROGRAM: Access Code: [facility] URL: [email or web address] Date: 12/30/2022 Prepared by: Chyrel Masson  Exercises - Seated Scapular Retraction  - 3-5 x daily - 7 x weekly - 1 sets - 10 reps - 3-5 hold - Supine Shoulder Flexion Extension AAROM with Dowel  - 1-2 x daily - 7 x weekly - 2-3 sets - 10-15 reps - 3 hold - Seated Shoulder Flexion AAROM with Dowel  - 1-2 x daily - 7 x weekly - 2-3 sets - 10-15 reps - 2 hold - Standing Isometric Shoulder External Rotation with Doorway (Mirrored)  - 2-3 x daily - 7 x weekly - 1 sets - 10 reps - 5 hold  ASSESSMENT:  CLINICAL IMPRESSION: Mild improvements noted in active range against gravity but noted deficits still present and shrug noted in movement.  Eccentric lowering with cane proved obtainable and may help develop elevation strengthening.  Continued skilled PT services warranted at this time.   OBJECTIVE IMPAIRMENTS: Abnormal gait, decreased activity tolerance, decreased balance, decreased coordination, decreased endurance, decreased mobility, difficulty walking, decreased ROM, decreased strength, impaired perceived functional ability, impaired flexibility, impaired UE functional use, improper body mechanics, postural dysfunction, and pain.   ACTIVITY LIMITATIONS: carrying, lifting, sleeping, bathing, toileting, dressing, self feeding, reach over head, and hygiene/grooming  PARTICIPATION LIMITATIONS: meal prep, cleaning, laundry, interpersonal relationship, and community activity  PERSONAL FACTORS:  [id number], DM,HTN, Rt hip OA (has THA scheduled 03/14/23)  are also affecting patient's functional outcome.   REHAB POTENTIAL: Good  CLINICAL DECISION MAKING: Stable/uncomplicated  EVALUATION COMPLEXITY: Low   GOALS: Goals reviewed  with patient? Yes  SHORT TERM GOALS: (target date for Short term goals are 3 weeks 01/20/2023)  1.Patient will demonstrate independent use of home exercise program to maintain progress from in clinic treatments. Goal status: Met  LONG TERM GOALS: (target dates for all long term goals are 10 weeks  03/10/2023 )   1. Patient will demonstrate/report pain at worst less than or equal to 2/10 to facilitate minimal limitation in daily activity secondary to pain symptoms. Goal status: on going 01/24/2023   2. Patient will demonstrate independent use of home exercise program to facilitate ability to maintain/progress functional gains from skilled physical therapy services. Goal status: on going 01/24/2023   3. Patient will demonstrate FOTO outcome > or = 66 % to indicate reduced disability due to condition. Goal status: on going 01/24/2023   4.  Patient will demonstrate Rt UE MMT 4/5 or greater throughout to facilitate lifting, reaching, carrying at [facility] in daily activity.   Goal status: on going 01/24/2023   5.  Patient will demonstrate Rt GH joint AROM WFL s symptoms to facilitate usual overhead reaching, self care, dressing at PLOF.    Goal status: on going 01/24/2023   6.  Patient will demonstrate/report ability to perform usual housework and self care at [facility].  Goal status: on going 01/24/2023    PLAN:  PT FREQUENCY: 1-2x/week  PT DURATION: 10 weeks  PLANNED INTERVENTIONS: Therapeutic exercises, Therapeutic activity, Neuro Muscular re-education, Balance training, Gait training, Patient/Family education, Joint mobilization, Stair training, DME instructions, Dry Needling, Electrical stimulation, Traction, Cryotherapy, vasopneumatic device  Moist heat, Taping, Ultrasound, Ionotophoresis 4mg /ml Dexamethasone, and aquatic therapy ,Manual therapy.  All included unless contraindicated  PLAN FOR NEXT SESSION:   Eccentric strengthening as tolerated.    Chyrel Masson, PT, DPT, OCS, ATC 01/24/23   9:19 AM

## 2023-01-25 ENCOUNTER — Other Ambulatory Visit: Payer: Self-pay | Admitting: Family Medicine

## 2023-01-25 DIAGNOSIS — M25551 Pain in right hip: Secondary | ICD-10-CM

## 2023-01-25 DIAGNOSIS — R051 Acute cough: Secondary | ICD-10-CM

## 2023-01-25 NOTE — Telephone Encounter (Signed)
Requesting: oxycodone 10-325mg  Sig: Take 1 tablet by mouth every 8 (eight) hours as needed for pain.  Contract: 12/10/21 UDS: 12/01/21 Last Visit: 01/18/2023 Next Visit: None Last Refill: 12/28/2022 #90 and 0RF    Please Advise

## 2023-01-26 ENCOUNTER — Encounter: Payer: Self-pay | Admitting: Rehabilitative and Restorative Service Providers"

## 2023-01-26 ENCOUNTER — Ambulatory Visit: Payer: BC Managed Care – PPO | Admitting: Rehabilitative and Restorative Service Providers"

## 2023-01-26 DIAGNOSIS — M6281 Muscle weakness (generalized): Secondary | ICD-10-CM

## 2023-01-26 DIAGNOSIS — R293 Abnormal posture: Secondary | ICD-10-CM

## 2023-01-26 DIAGNOSIS — G8929 Other chronic pain: Secondary | ICD-10-CM | POA: Diagnosis not present

## 2023-01-26 DIAGNOSIS — M25511 Pain in right shoulder: Secondary | ICD-10-CM

## 2023-01-26 MED ORDER — OXYCODONE-ACETAMINOPHEN 10-325 MG PO TABS
1.0000 | ORAL_TABLET | Freq: Three times a day (TID) | ORAL | 0 refills | Status: DC | PRN
Start: 2023-01-26 — End: 2023-02-22

## 2023-01-26 NOTE — Therapy (Signed)
OUTPATIENT PHYSICAL THERAPY  TREATMENT   Patient Name: Richard [name] MRN: [id number] DOB:05-26-1951, 72 y.o., male Today's Date: 01/26/2023  END OF SESSION:  PT End of Session - 01/26/23 0841     Visit Number 5    Number of Visits 20    Date for PT Re-Evaluation 03/10/23    Authorization Type BCBS state $52 copay    Progress Note Due on Visit 10    PT Start Time 0842    PT Stop Time 0921    PT Time Calculation (min) 39 min    Activity Tolerance Patient limited by fatigue    Behavior During Therapy [facility] for tasks assessed/performed                Past Medical History:  Diagnosis Date   Allergy    seasonal   Arthritis     left knee,  right hip   Back pain    Bilateral swelling of feet    Complication of anesthesia    half awake during last colonscopy   Constipation    Diabetes mellitus    type 2   Hx of adenomatous colonic polyps    2010 6 mm adenoma 10/06/2016 2 5 mm descending polyps    Hyperlipidemia    Hypertension    Joint pain    Lactose intolerance    Past Surgical History:  Procedure Laterality Date   COLONOSCOPY     COLONOSCOPY WITH PROPOFOL N/A 10/06/2016   Procedure: COLONOSCOPY WITH PROPOFOL;  Surgeon: [name], MD;  Location: [facility];  Service: Endoscopy;  Laterality: N/A;   Patient Active Problem List   Diagnosis Date Noted   Right shoulder pain 02/25/2022   Acute right otitis media 02/25/2022   Morbid obesity (HCC) 02/25/2022   Controlled type 2 diabetes mellitus with complication, without long-term current use of insulin (HCC) 06/02/2021   Cough 12/30/2020   Uncontrolled type 2 diabetes mellitus with hyperglycemia (HCC) 02/21/2020   Right hip pain 08/16/2018   Viral upper respiratory tract infection 03/10/2017   Hx of adenomatous colonic polyps    Benign neoplasm of descending colon    Primary hypertension 09/06/2016   Tooth abscess 09/01/2015   Sinusitis 11/08/2014   Sinusitis, acute 10/28/2014   Trigger ring finger of  right hand 02/06/2013   Suspicious mole 02/06/2013   Avulsion of toenail 12/11/2010   Acute sinusitis 08/17/2010   WOUND, FINGER 07/10/2010   SCIATICA, LEFT 09/02/2009   UNSPECIFIED PERIPHERAL VASCULAR DISEASE 07/04/2009   Hyperlipidemia LDL goal <70 05/05/2009   HEARTBURN 04/22/2009   DM (diabetes mellitus) type II uncontrolled, periph vascular disorder (HCC) 01/23/2009    PCP: [name] DO  REFERRING PROVIDER: Tarry Kos, MD  REFERRING DIAG: [phone] (ICD-10-CM) - Chronic right shoulder pain  THERAPY DIAG:  Chronic right shoulder pain  Muscle weakness (generalized)  Abnormal posture  Rationale for Evaluation and Treatment: Rehabilitation  ONSET DATE: May 2024  SUBJECTIVE:  SUBJECTIVE STATEMENT: Pt indicated no pain upon arrival today.  Pt indicated having noted soreness across upper back and lower back night after last visit.  He reported icing helped soreness go away yesterday.    PERTINENT HISTORY: [id number], DM,HTN, Rt hip OA (has THA scheduled 03/14/23)  PAIN:  NPRS scale: no pain upon arrival in shoulder at rest.  Pain location: Rt shoulder pain Pain description: achy/tightness Aggravating factors: reaching overhead/shoulder height Relieving factors: some light exercises/stretching   PRECAUTIONS: None  WEIGHT BEARING RESTRICTIONS: No  FALLS:  Has patient fallen in last 6 months? No  LIVING ENVIRONMENT: Lives in: House/apartment  OCCUPATION: Retired   PLOF: Independent, Rt hand dominant.   Housecleaning, trash cans out and back to curb  PATIENT GOALS:  Reduce pain, being able to use arm above head.   OBJECTIVE:   PATIENT SURVEYS:  12/30/2022 FOTO intake:  51   predicted:  66  COGNITION: 12/30/2022 Overall cognitive status:  WFL     SENSATION: 12/30/2022 WFL to light touch  POSTURE: 12/30/2022  Rounded shoulders, FHP  UPPER EXTREMITY ROM:   ROM Right 12/30/2022 Left 12/30/2022 Right 01/24/2023 Sitting   Shoulder flexion 70 deg AROM against gravity in sitting with shrug noted  Supine AROM 100 deg WFL 85 deg AROM with shrug  Shoulder extension     Shoulder abduction 45 deg AROM against gravity in sitting with shrug noted  70 deg AROM against gravity in sitting with shrug noted  Shoulder adduction     Shoulder internal rotation     Shoulder external rotation 45 deg PROM in 45 dg abduction supine with muscle guarding  45 deg AROM In sitting with arm at side  Elbow flexion     Elbow extension     Wrist flexion     Wrist extension     Wrist ulnar deviation     Wrist radial deviation     Wrist pronation     Wrist supination     (Blank rows = not tested)  UPPER EXTREMITY MMT:  MMT Right 12/30/2022 Left 12/30/2022 Right 01/12/2023 Right 01/24/2023 Left 01/24/2023  Shoulder flexion 2/5 5/5     Shoulder extension       Shoulder abduction 2/5 5/5     Shoulder adduction       Shoulder internal rotation 5/5 5/5     Shoulder external rotation 2+/5 5/5 3+/5    Middle trapezius       Lower trapezius       Elbow flexion 4/5 5/5     Elbow extension 5/5 5/5     Wrist flexion       Wrist extension       Wrist ulnar deviation       Wrist radial deviation       Wrist pronation       Wrist supination       Grip strength (lbs)    63.9 lbs 64.2, 62 lbs  (Blank rows = not tested)  SHOULDER SPECIAL TESTS: 12/30/2022 (+) Shrug, drop arm on Rt shoulder   JOINT MOBILITY TESTING:  12/30/2022 No specific limitation in passive accessory movement Rt shoulder mid range in all directions.   PALPATION:  12/30/2022 Tightness in Rt upper trap.  TODAY'S TREATMENT:                                                                                                       DATE: 01/26/2023 Therex: UBE forward 4 mins fwd, 4 mins backward lvl 2.0 with 2 min rest break between directions.  Seated Cross arm stretch 15 seconds x 3 bilateral Seated scapular retraction x 10 Seated wand assist AAROM into flexion c eccentric Rt arm only x 5, then x 10 with AAROM lowering too Seated green band rows bilateral 2 x 15 Seated red band ER c scapular retraction bilateral x15 Seated upper trap stretch 15 seconds x 3 bilateral   TODAY'S TREATMENT:                                                                                                       DATE: 01/24/2023 Therex: UBE forward 6.5 mins, back 5 mins lvl 2.5 Seated wand assist AAROM into flexion c eccentric Rt arm only x 15, x 10  Seated green band rows bilateral 2 x 15 Seated isometric GH extension into table 5 sec hold x 10 (talked about for home use) Seated red band bilateral 2 x 10    TODAY'S TREATMENT:                                                                                                       DATE: 01/12/2023 Therex: UBE UE only Lvl 2 3 mins fwd, back each way with 1 min rest between  Seated Rt shoulder ER isometrics 5 sec on/off x 12 Seated shoulder flexion AAROM flexion using cane to approx. 90 deg flexion x 10  Seated green band rows bilateral 2 x 15 Seated Rt shoulder UE range on flexion to approx. 100 deg flexion x 15, scaption to approx. 100 deg x 15 Seated Rt shoulder IR with towel under arm green band 2 x 10     TODAY'S TREATMENT:  DATE:01/10/2023 Therex: UBE L2 for 5 min switch directions halfway Seated scapular retractions 5 sec hold X 10 Seated Rt shoulder ER isometrics 5 sec hold X 10 Seated Rt shoulder abd isometrics 5 sec hold X 10 Seated shoulder  flexion AAROM flexion using cane 5 sec hold at top X 10 Seated hammer curl 2# reps of 7, 3 Seated UE ranger that was positioned on floor X 10 reps eachfor AAROM flexion/extension, horizontal abd/add, circles CW and CCW  TODAY'S TREATMENT:                                                                                                       DATE:12/30/2022 Therex:    HEP instruction/performance c cues for techniques, handout provided.  Trial set performed of each for comprehension and symptom assessment.  See below for exercise list   PATIENT EDUCATION: 12/30/2022 Education details: HEP, POC Person educated: Patient Education method: Explanation, Demonstration, Verbal cues, and Handouts Education comprehension: verbalized understanding, returned demonstration, and verbal cues required  HOME EXERCISE PROGRAM: Access Code: [facility] URL: [email or web address] Date: 01/26/2023 Prepared by: Chyrel Masson  Exercises - Seated Scapular Retraction  - 3-5 x daily - 7 x weekly - 1 sets - 10 reps - 3-5 hold - Standing Shoulder Posterior Capsule Stretch (Mirrored)  - 2 x daily - 7 x weekly - 1 sets - 5 reps - 15-30 hold - Seated Upper Trapezius Stretch  - 2 x daily - 7 x weekly - 1 sets - 5 reps - 15 hold - Seated Shoulder Flexion AAROM with Dowel  - 1-2 x daily - 7 x weekly - 2-3 sets - 10-15 reps - 2 hold - Standing Isometric Shoulder External Rotation with Doorway (Mirrored)  - 2-3 x daily - 7 x weekly - 1 sets - 10 reps - 5 hold - Shoulder External Rotation and Scapular Retraction with Resistance  - 1 x daily - 7 x weekly - 1-2 sets - 10-15 reps - Standing Bilateral Low Shoulder Row with Anchored Resistance  - 1 x daily - 7 x weekly - 2-3 sets - 10-15 reps  ASSESSMENT:  CLINICAL IMPRESSION: Due to soreness complaints, adjusted intervention today with reduced resistance and reduced amounts to avoid excessive after visit soreness. Able to perform intervention in clinic today with no  specific worsened symptoms post activity.   Education on adjustment of exercise levels due to avoid excessive soreness after activity but encouraged continued work to level appropriate. Continued skilled PT services indicated at this time.   OBJECTIVE IMPAIRMENTS: Abnormal gait, decreased activity tolerance, decreased balance, decreased coordination, decreased endurance, decreased mobility, difficulty walking, decreased ROM, decreased strength, impaired perceived functional ability, impaired flexibility, impaired UE functional use, improper body mechanics, postural dysfunction, and pain.   ACTIVITY LIMITATIONS: carrying, lifting, sleeping, bathing, toileting, dressing, self feeding, reach over head, and hygiene/grooming  PARTICIPATION LIMITATIONS: meal prep, cleaning, laundry, interpersonal relationship, and community activity  PERSONAL FACTORS:  [id number], DM,HTN, Rt hip OA (has THA scheduled 03/14/23)  are also affecting patient's functional outcome.   REHAB POTENTIAL: Good  CLINICAL DECISION MAKING:  Stable/uncomplicated  EVALUATION COMPLEXITY: Low   GOALS: Goals reviewed with patient? Yes  SHORT TERM GOALS: (target date for Short term goals are 3 weeks 01/20/2023)  1.Patient will demonstrate independent use of home exercise program to maintain progress from in clinic treatments. Goal status: Met  LONG TERM GOALS: (target dates for all long term goals are 10 weeks  03/10/2023 )   1. Patient will demonstrate/report pain at worst less than or equal to 2/10 to facilitate minimal limitation in daily activity secondary to pain symptoms. Goal status: on going 01/24/2023   2. Patient will demonstrate independent use of home exercise program to facilitate ability to maintain/progress functional gains from skilled physical therapy services. Goal status: on going 01/24/2023   3. Patient will demonstrate FOTO outcome > or = 66 % to indicate reduced disability due to condition. Goal status: on  going 01/24/2023   4.  Patient will demonstrate Rt UE MMT 4/5 or greater throughout to facilitate lifting, reaching, carrying at [facility] in daily activity.   Goal status: on going 01/24/2023   5.  Patient will demonstrate Rt GH joint AROM WFL s symptoms to facilitate usual overhead reaching, self care, dressing at PLOF.    Goal status: on going 01/24/2023   6.  Patient will demonstrate/report ability to perform usual housework and self care at [facility].  Goal status: on going 01/24/2023    PLAN:  PT FREQUENCY: 1-2x/week  PT DURATION: 10 weeks  PLANNED INTERVENTIONS: Therapeutic exercises, Therapeutic activity, Neuro Muscular re-education, Balance training, Gait training, Patient/Family education, Joint mobilization, Stair training, DME instructions, Dry Needling, Electrical stimulation, Traction, Cryotherapy, vasopneumatic device Moist heat, Taping, Ultrasound, Ionotophoresis 4mg /ml Dexamethasone, and aquatic therapy ,Manual therapy.  All included unless contraindicated  PLAN FOR NEXT SESSION:  Monitor soreness response, progress as able.    Chyrel Masson, PT, DPT, OCS, ATC 01/26/23  9:20 AM

## 2023-01-31 ENCOUNTER — Ambulatory Visit: Payer: BC Managed Care – PPO | Admitting: Physical Therapy

## 2023-01-31 ENCOUNTER — Encounter: Payer: Self-pay | Admitting: Physical Therapy

## 2023-01-31 DIAGNOSIS — G8929 Other chronic pain: Secondary | ICD-10-CM

## 2023-01-31 DIAGNOSIS — M25511 Pain in right shoulder: Secondary | ICD-10-CM

## 2023-01-31 DIAGNOSIS — M6281 Muscle weakness (generalized): Secondary | ICD-10-CM

## 2023-01-31 DIAGNOSIS — R293 Abnormal posture: Secondary | ICD-10-CM | POA: Diagnosis not present

## 2023-01-31 NOTE — Therapy (Signed)
OUTPATIENT PHYSICAL THERAPY  TREATMENT   Patient Name: Richard [name] MRN: [id number] DOB:Sep 11, 1950, 72 y.o., male Today's Date: 01/31/2023  END OF SESSION:  PT End of Session - 01/31/23 0856     Visit Number 6    Number of Visits 20    Date for PT Re-Evaluation 03/10/23    Authorization Type [employer] state $52 copay    Progress Note Due on Visit 10    PT Start Time 0845    PT Stop Time 0923    PT Time Calculation (min) 38 min    Activity Tolerance Patient limited by fatigue    Behavior During Therapy [facility] for tasks assessed/performed                 Past Medical History:  Diagnosis Date   Allergy    seasonal   Arthritis     left knee,  right hip   Back pain    Bilateral swelling of feet    Complication of anesthesia    half awake during last colonscopy   Constipation    Diabetes mellitus    type 2   Hx of adenomatous colonic polyps    2010 6 mm adenoma 10/06/2016 2 5 mm descending polyps    Hyperlipidemia    Hypertension    Joint pain    Lactose intolerance    Past Surgical History:  Procedure Laterality Date   COLONOSCOPY     COLONOSCOPY WITH PROPOFOL N/A 10/06/2016   Procedure: COLONOSCOPY WITH PROPOFOL;  Surgeon: [name], MD;  Location: [facility];  Service: Endoscopy;  Laterality: N/A;   Patient Active Problem List   Diagnosis Date Noted   Right shoulder pain 02/25/2022   Acute right otitis media 02/25/2022   Morbid obesity (HCC) 02/25/2022   Controlled type 2 diabetes mellitus with complication, without long-term current use of insulin (HCC) 06/02/2021   Cough 12/30/2020   Uncontrolled type 2 diabetes mellitus with hyperglycemia (HCC) 02/21/2020   Right hip pain 08/16/2018   Viral upper respiratory tract infection 03/10/2017   Hx of adenomatous colonic polyps    Benign neoplasm of descending colon    Primary hypertension 09/06/2016   Tooth abscess 09/01/2015   Sinusitis 11/08/2014   Sinusitis, acute 10/28/2014   Trigger ring finger of  right hand 02/06/2013   Suspicious mole 02/06/2013   Avulsion of toenail 12/11/2010   Acute sinusitis 08/17/2010   WOUND, FINGER 07/10/2010   SCIATICA, LEFT 09/02/2009   UNSPECIFIED PERIPHERAL VASCULAR DISEASE 07/04/2009   Hyperlipidemia LDL goal <70 05/05/2009   HEARTBURN 04/22/2009   DM (diabetes mellitus) type II uncontrolled, periph vascular disorder (HCC) 01/23/2009    PCP: [name] DO  REFERRING PROVIDER: Tarry Kos, MD  REFERRING DIAG: [phone] (ICD-10-CM) - Chronic right shoulder pain  THERAPY DIAG:  Chronic right shoulder pain  Muscle weakness (generalized)  Abnormal posture  Rationale for Evaluation and Treatment: Rehabilitation  ONSET DATE: May 2024  SUBJECTIVE:  SUBJECTIVE STATEMENT: He states he has been sick lately for last 10 days with strep throat and has achy shoulder today    PERTINENT HISTORY: [id number], DM,HTN, Rt hip OA (has THA scheduled 03/14/23)  PAIN:  NPRS scale: achy pain at times, does not rate today  Pain location: Rt shoulder pain Pain description: achy/tightness Aggravating factors: reaching overhead/shoulder height Relieving factors: some light exercises/stretching   PRECAUTIONS: None  WEIGHT BEARING RESTRICTIONS: No  FALLS:  Has patient fallen in last 6 months? No  LIVING ENVIRONMENT: Lives in: House/apartment  OCCUPATION: Retired   PLOF: Independent, Rt hand dominant.   Housecleaning, trash cans out and back to curb  PATIENT GOALS:  Reduce pain, being able to use arm above head.   OBJECTIVE:   PATIENT SURVEYS:  12/30/2022 FOTO intake:  51   predicted:  66  COGNITION: 12/30/2022 Overall cognitive status: WFL     SENSATION: 12/30/2022 WFL to light touch  POSTURE: 12/30/2022  Rounded shoulders, FHP  UPPER  EXTREMITY ROM:   ROM Right 12/30/2022 Left 12/30/2022 Right 01/24/2023 Sitting   Shoulder flexion 70 deg AROM against gravity in sitting with shrug noted  Supine AROM 100 deg WFL 85 deg AROM with shrug  Shoulder extension     Shoulder abduction 45 deg AROM against gravity in sitting with shrug noted  70 deg AROM against gravity in sitting with shrug noted  Shoulder adduction     Shoulder internal rotation     Shoulder external rotation 45 deg PROM in 45 dg abduction supine with muscle guarding  45 deg AROM In sitting with arm at side  Elbow flexion     Elbow extension     Wrist flexion     Wrist extension     Wrist ulnar deviation     Wrist radial deviation     Wrist pronation     Wrist supination     (Blank rows = not tested)  UPPER EXTREMITY MMT:  MMT Right 12/30/2022 Left 12/30/2022 Right 01/12/2023 Right 01/24/2023 Left 01/24/2023  Shoulder flexion 2/5 5/5     Shoulder extension       Shoulder abduction 2/5 5/5     Shoulder adduction       Shoulder internal rotation 5/5 5/5     Shoulder external rotation 2+/5 5/5 3+/5    Middle trapezius       Lower trapezius       Elbow flexion 4/5 5/5     Elbow extension 5/5 5/5     Wrist flexion       Wrist extension       Wrist ulnar deviation       Wrist radial deviation       Wrist pronation       Wrist supination       Grip strength (lbs)    63.9 lbs 64.2, 62 lbs  (Blank rows = not tested)  SHOULDER SPECIAL TESTS: 12/30/2022 (+) Shrug, drop arm on Rt shoulder   JOINT MOBILITY TESTING:  12/30/2022 No specific limitation in passive accessory movement Rt shoulder mid range in all directions.   PALPATION:  12/30/2022 Tightness in Rt upper trap.  TODAY'S TREATMENT:                                                                                                        DATE: 01/31/2023 Therex: UBE forward 4 mins fwd, 4 mins backward lvl 2.0 with 2 min rest break between directions.  Seated Cross arm stretch 15 seconds x 3 bilateral Seated scapular retraction x 15 Seated wand assist AAROM into flexion c eccentric Rt arm only x 5, then x 10 with AAROM lowering too Seated green band rows bilateral 2 x 15 Seated red band ER c scapular retraction bilateral x15 Seated red band IR for Rt shoulder X 15 Seated upper trap stretch 15 seconds x 3 bilateral  Cold pack X 10 minutes to shoulders not included in treatment time.   TODAY'S TREATMENT:                                                                                                       DATE: 01/26/2023 Therex: UBE forward 4 mins fwd, 4 mins backward lvl 2.0 with 2 min rest break between directions.  Seated Cross arm stretch 15 seconds x 3 bilateral Seated scapular retraction x 10 Seated wand assist AAROM into flexion c eccentric Rt arm only x 5, then x 10 with AAROM lowering too Seated green band rows bilateral 2 x 15 Seated red band ER c scapular retraction bilateral x15 Seated upper trap stretch 15 seconds x 3 bilateral   TODAY'S TREATMENT:                                                                                                       DATE: 01/24/2023 Therex: UBE forward 6.5 mins, back 5 mins lvl 2.5 Seated wand assist AAROM into flexion c eccentric Rt arm only x 15, x 10  Seated green band rows bilateral 2 x 15 Seated isometric GH extension into table 5 sec hold x 10 (talked about for home use) Seated red band bilateral 2 x 10    TODAY'S TREATMENT:  DATE: 01/12/2023 Therex: UBE UE only Lvl 2 3 mins fwd, back each way with 1 min rest between  Seated Rt shoulder ER isometrics 5 sec on/off x 12 Seated shoulder flexion AAROM flexion using cane to approx. 90 deg  flexion x 10  Seated green band rows bilateral 2 x 15 Seated Rt shoulder UE range on flexion to approx. 100 deg flexion x 15, scaption to approx. 100 deg x 15 Seated Rt shoulder IR with towel under arm green band 2 x 10     PATIENT EDUCATION: 12/30/2022 Education details: HEP, POC Person educated: Patient Education method: Programmer, multimedia, Demonstration, Verbal cues, and Handouts Education comprehension: verbalized understanding, returned demonstration, and verbal cues required  HOME EXERCISE PROGRAM: Access Code: [facility] URL: [email or web address] Date: 01/26/2023 Prepared by: Chyrel Masson  Exercises - Seated Scapular Retraction  - 3-5 x daily - 7 x weekly - 1 sets - 10 reps - 3-5 hold - Standing Shoulder Posterior Capsule Stretch (Mirrored)  - 2 x daily - 7 x weekly - 1 sets - 5 reps - 15-30 hold - Seated Upper Trapezius Stretch  - 2 x daily - 7 x weekly - 1 sets - 5 reps - 15 hold - Seated Shoulder Flexion AAROM with Dowel  - 1-2 x daily - 7 x weekly - 2-3 sets - 10-15 reps - 2 hold - Standing Isometric Shoulder External Rotation with Doorway (Mirrored)  - 2-3 x daily - 7 x weekly - 1 sets - 10 reps - 5 hold - Shoulder External Rotation and Scapular Retraction with Resistance  - 1 x daily - 7 x weekly - 1-2 sets - 10-15 reps - Standing Bilateral Low Shoulder Row with Anchored Resistance  - 1 x daily - 7 x weekly - 2-3 sets - 10-15 reps  ASSESSMENT:  CLINICAL IMPRESSION: He had good tolerance to session focusing on shoulder strength and ROM. He does still have limitations noted for lifting his Rt arm above 90 degrees.   OBJECTIVE IMPAIRMENTS: Abnormal gait, decreased activity tolerance, decreased balance, decreased coordination, decreased endurance, decreased mobility, difficulty walking, decreased ROM, decreased strength, impaired perceived functional ability, impaired flexibility, impaired UE functional use, improper body mechanics, postural dysfunction, and pain.    ACTIVITY LIMITATIONS: carrying, lifting, sleeping, bathing, toileting, dressing, self feeding, reach over head, and hygiene/grooming  PARTICIPATION LIMITATIONS: meal prep, cleaning, laundry, interpersonal relationship, and community activity  PERSONAL FACTORS:  [id number], DM,HTN, Rt hip OA (has THA scheduled 03/14/23)  are also affecting patient's functional outcome.   REHAB POTENTIAL: Good  CLINICAL DECISION MAKING: Stable/uncomplicated  EVALUATION COMPLEXITY: Low   GOALS: Goals reviewed with patient? Yes  SHORT TERM GOALS: (target date for Short term goals are 3 weeks 01/20/2023)  1.Patient will demonstrate independent use of home exercise program to maintain progress from in clinic treatments. Goal status: Met  LONG TERM GOALS: (target dates for all long term goals are 10 weeks  03/10/2023 )   1. Patient will demonstrate/report pain at worst less than or equal to 2/10 to facilitate minimal limitation in daily activity secondary to pain symptoms. Goal status: on going 01/24/2023   2. Patient will demonstrate independent use of home exercise program to facilitate ability to maintain/progress functional gains from skilled physical therapy services. Goal status: on going 01/24/2023   3. Patient will demonstrate FOTO outcome > or = 66 % to indicate reduced disability due to condition. Goal status: on going 01/24/2023   4.  Patient will demonstrate Rt UE MMT 4/5 or greater throughout  to facilitate lifting, reaching, carrying at [facility] in daily activity.   Goal status: on going 01/24/2023   5.  Patient will demonstrate Rt GH joint AROM WFL s symptoms to facilitate usual overhead reaching, self care, dressing at PLOF.    Goal status: on going 01/24/2023   6.  Patient will demonstrate/report ability to perform usual housework and self care at [facility].  Goal status: on going 01/24/2023    PLAN:  PT FREQUENCY: 1-2x/week  PT DURATION: 10 weeks  PLANNED INTERVENTIONS: Therapeutic  exercises, Therapeutic activity, Neuro Muscular re-education, Balance training, Gait training, Patient/Family education, Joint mobilization, Stair training, DME instructions, Dry Needling, Electrical stimulation, Traction, Cryotherapy, vasopneumatic device Moist heat, Taping, Ultrasound, Ionotophoresis 4mg /ml Dexamethasone, and aquatic therapy ,Manual therapy.  All included unless contraindicated  PLAN FOR NEXT SESSION:  Monitor soreness response, progress as able.    Ivery Quale, PT, DPT 01/31/23 9:08 AM

## 2023-02-01 ENCOUNTER — Ambulatory Visit (INDEPENDENT_AMBULATORY_CARE_PROVIDER_SITE_OTHER): Payer: BC Managed Care – PPO | Admitting: Family Medicine

## 2023-02-01 ENCOUNTER — Encounter (INDEPENDENT_AMBULATORY_CARE_PROVIDER_SITE_OTHER): Payer: Self-pay | Admitting: Family Medicine

## 2023-02-01 VITALS — BP 158/62 | HR 88 | Temp 98.2°F | Ht 69.0 in

## 2023-02-01 DIAGNOSIS — I152 Hypertension secondary to endocrine disorders: Secondary | ICD-10-CM | POA: Diagnosis not present

## 2023-02-01 DIAGNOSIS — Z7984 Long term (current) use of oral hypoglycemic drugs: Secondary | ICD-10-CM

## 2023-02-01 DIAGNOSIS — E1165 Type 2 diabetes mellitus with hyperglycemia: Secondary | ICD-10-CM

## 2023-02-01 DIAGNOSIS — Z6841 Body Mass Index (BMI) 40.0 and over, adult: Secondary | ICD-10-CM

## 2023-02-01 DIAGNOSIS — E669 Obesity, unspecified: Secondary | ICD-10-CM | POA: Diagnosis not present

## 2023-02-01 DIAGNOSIS — E1159 Type 2 diabetes mellitus with other circulatory complications: Secondary | ICD-10-CM

## 2023-02-01 NOTE — Progress Notes (Signed)
Chief Complaint:   OBESITY Richard [name] is here to discuss his progress with his obesity treatment plan along with follow-up of his obesity related diagnoses. Richard [name] is on the Category 3 Plan and states he is following his eating plan approximately 60-70% of the time. Richard [name] states he is active while doing daily activities.    Today's visit was #: 10 Starting weight: 310 lbs Starting date: 06/22/2022 Today's weight: 275 lbs Today's date: 02/01/2023 Total lbs lost to date: 35 Total lbs lost since last in-office visit: 3  Interim History: Since last appointment patient celebrated his birthday and then got Strep.  He is planning for hip arthroplasty on 9/30 with Dr. Lequita [name]. He mentions hisi s getting his house ready for upcoming surgery.  He is anticipating sticking with his meal plan as strictly as he can for the next month. He thinks that since he has been ill it has slowed his weight loss progress.  Subjective:   1. Type 2 diabetes mellitus with hyperglycemia, without long-term current use of insulin (HCC) Patient is on metformin twice daily, and his last A1c was 6.0.  2. Hypertension associated with diabetes (HCC) Patient's blood pressure is elevated today, but previously within normal limits.  He denies chest pain, chest pressure, or headache.  He is on a Benicar-HCT.  Assessment/Plan:   1. Type 2 diabetes mellitus with hyperglycemia, without long-term current use of insulin (HCC) Patient will continue metformin with no change in dose.  2. Hypertension associated with diabetes (HCC) We will follow-up on patient's blood pressure at his next appointment with Dr. Dalbert [name].  3. BMI 40.0-44.9, adult (HCC)  4. Obesity with starting BMI of 45.9 Richard [name] is currently in the action stage of change. As such, his goal is to continue with weight loss efforts. He has agreed to the Category 3 Plan.   Exercise goals: All adults should avoid inactivity. Some physical activity is better than  none, and adults who participate in any amount of physical activity gain some health benefits.  Behavioral modification strategies: increasing lean protein intake, meal planning and cooking strategies, keeping healthy foods in the home, and planning for success.  Richard [name] has agreed to follow-up with our clinic in 3 to 4 weeks with Dr. Dalbert [name]. He was informed of the importance of frequent follow-up visits to maximize his success with intensive lifestyle modifications for his multiple health conditions.   Objective:   Blood pressure (!) 158/62, pulse 88, temperature 98.2 F (36.8 C), height 5\' 9"  (1.753 m), SpO2 98%. Body mass index is 41.7 kg/m.  General: Cooperative, alert, well developed, in no acute distress. HEENT: Conjunctivae and lids unremarkable. Cardiovascular: Regular rhythm.  Lungs: Normal work of breathing. Neurologic: No focal deficits.   Lab Results  Component Value Date   CREATININE 0.90 11/30/2022   BUN 19 11/30/2022   NA 136 11/30/2022   K 4.5 11/30/2022   CL 96 11/30/2022   CO2 28 11/30/2022   Lab Results  Component Value Date   ALT 11 11/30/2022   AST 12 11/30/2022   ALKPHOS 57 11/30/2022   BILITOT 1.3 (H) 11/30/2022   Lab Results  Component Value Date   HGBA1C 6.0 11/30/2022   HGBA1C 5.9 (H) 05/21/2022   HGBA1C 6.4 12/01/2021   HGBA1C 6.5 06/02/2021   HGBA1C 6.4 09/25/2020   No results found for: "INSULIN" Lab Results  Component Value Date   TSH 0.40 11/30/2022   Lab Results  Component Value Date   CHOL 170 11/30/2022  HDL 77.00 11/30/2022   LDLCALC 80 11/30/2022   LDLDIRECT 133.0 12/05/2017   TRIG 64.0 11/30/2022   CHOLHDL 2 11/30/2022   Lab Results  Component Value Date   VD25OH 39.18 11/30/2022   VD25OH 13 (L) 05/21/2022   Lab Results  Component Value Date   WBC 8.9 11/30/2022   HGB 14.2 11/30/2022   HCT 43.7 11/30/2022   MCV 100.0 11/30/2022   PLT 317.0 11/30/2022   No results found for: "IRON", "TIBC",  "FERRITIN"  Attestation Statements:   Reviewed by clinician on day of visit: allergies, medications, problem list, medical history, surgical history, family history, social history, and previous encounter notes.   I, Richard [name], am acting as transcriptionist for Richard Likes, MD.  I have reviewed the above documentation for accuracy and completeness, and I agree with the above. - Richard Likes, MD

## 2023-02-02 ENCOUNTER — Encounter: Payer: Self-pay | Admitting: Physical Therapy

## 2023-02-02 ENCOUNTER — Ambulatory Visit: Payer: BC Managed Care – PPO | Admitting: Physical Therapy

## 2023-02-02 DIAGNOSIS — G8929 Other chronic pain: Secondary | ICD-10-CM | POA: Diagnosis not present

## 2023-02-02 DIAGNOSIS — M6281 Muscle weakness (generalized): Secondary | ICD-10-CM | POA: Diagnosis not present

## 2023-02-02 DIAGNOSIS — M25511 Pain in right shoulder: Secondary | ICD-10-CM | POA: Diagnosis not present

## 2023-02-02 DIAGNOSIS — R293 Abnormal posture: Secondary | ICD-10-CM | POA: Diagnosis not present

## 2023-02-02 NOTE — Therapy (Signed)
OUTPATIENT PHYSICAL THERAPY  TREATMENT   Patient Name: Richard [name] MRN: [id number] DOB:12-07-1950, 72 y.o., male Today's Date: 02/02/2023  END OF SESSION:  PT End of Session - 02/02/23 0909     Visit Number 7    Number of Visits 20    Date for PT Re-Evaluation 03/10/23    Authorization Type [employer] state $52 copay    Progress Note Due on Visit 10    PT Start Time 0849    PT Stop Time 0927    PT Time Calculation (min) 38 min    Activity Tolerance Patient limited by fatigue    Behavior During Therapy [facility] for tasks assessed/performed                 Past Medical History:  Diagnosis Date   Allergy    seasonal   Arthritis     left knee,  right hip   Back pain    Bilateral swelling of feet    Complication of anesthesia    half awake during last colonscopy   Constipation    Diabetes mellitus    type 2   Hx of adenomatous colonic polyps    2010 6 mm adenoma 10/06/2016 2 5 mm descending polyps    Hyperlipidemia    Hypertension    Joint pain    Lactose intolerance    Past Surgical History:  Procedure Laterality Date   COLONOSCOPY     COLONOSCOPY WITH PROPOFOL N/A 10/06/2016   Procedure: COLONOSCOPY WITH PROPOFOL;  Surgeon: [name], MD;  Location: [facility];  Service: Endoscopy;  Laterality: N/A;   Patient Active Problem List   Diagnosis Date Noted   Right shoulder pain 02/25/2022   Acute right otitis media 02/25/2022   Morbid obesity (HCC) 02/25/2022   Controlled type 2 diabetes mellitus with complication, without long-term current use of insulin (HCC) 06/02/2021   Cough 12/30/2020   Uncontrolled type 2 diabetes mellitus with hyperglycemia (HCC) 02/21/2020   Right hip pain 08/16/2018   Viral upper respiratory tract infection 03/10/2017   Hx of adenomatous colonic polyps    Benign neoplasm of descending colon    Primary hypertension 09/06/2016   Tooth abscess 09/01/2015   Sinusitis 11/08/2014   Sinusitis, acute 10/28/2014   Trigger ring finger of  right hand 02/06/2013   Suspicious mole 02/06/2013   Avulsion of toenail 12/11/2010   Acute sinusitis 08/17/2010   WOUND, FINGER 07/10/2010   SCIATICA, LEFT 09/02/2009   UNSPECIFIED PERIPHERAL VASCULAR DISEASE 07/04/2009   Hyperlipidemia LDL goal <70 05/05/2009   HEARTBURN 04/22/2009   DM (diabetes mellitus) type II uncontrolled, periph vascular disorder (HCC) 01/23/2009    PCP: [name] DO  REFERRING PROVIDER: Tarry Kos, MD  REFERRING DIAG: [id number] (ICD-10-CM) - Chronic right shoulder pain  THERAPY DIAG:  Chronic right shoulder pain  Muscle weakness (generalized)  Abnormal posture  Acute pain of right shoulder  Rationale for Evaluation and Treatment: Rehabilitation  ONSET DATE: May 2024  SUBJECTIVE:  SUBJECTIVE STATEMENT: He states he bought a new pillow and when he woke up he did have less pain in his shoulders. No pain at the moment in his shoulder   PERTINENT HISTORY: [id number], DM,HTN, Rt hip OA (has THA scheduled 03/14/23)  PAIN:  NPRS scale: achy pain at times, does not rate today  Pain location: Rt shoulder pain Pain description: achy/tightness Aggravating factors: reaching overhead/shoulder height Relieving factors: some light exercises/stretching   PRECAUTIONS: None  WEIGHT BEARING RESTRICTIONS: No  FALLS:  Has patient fallen in last 6 months? No  LIVING ENVIRONMENT: Lives in: House/apartment  OCCUPATION: Retired   PLOF: Independent, Rt hand dominant.   Housecleaning, trash cans out and back to curb  PATIENT GOALS:  Reduce pain, being able to use arm above head.   OBJECTIVE:   PATIENT SURVEYS:  12/30/2022 FOTO intake:  51   predicted:  66  COGNITION: 12/30/2022 Overall cognitive status: WFL     SENSATION: 12/30/2022 WFL to light  touch  POSTURE: 12/30/2022  Rounded shoulders, FHP  UPPER EXTREMITY ROM:   ROM Right 12/30/2022 Left 12/30/2022 Right 01/24/2023 Sitting   Shoulder flexion 70 deg AROM against gravity in sitting with shrug noted  Supine AROM 100 deg WFL 85 deg AROM with shrug  Shoulder extension     Shoulder abduction 45 deg AROM against gravity in sitting with shrug noted  70 deg AROM against gravity in sitting with shrug noted  Shoulder adduction     Shoulder internal rotation     Shoulder external rotation 45 deg PROM in 45 dg abduction supine with muscle guarding  45 deg AROM In sitting with arm at side  Elbow flexion     Elbow extension     Wrist flexion     Wrist extension     Wrist ulnar deviation     Wrist radial deviation     Wrist pronation     Wrist supination     (Blank rows = not tested)  UPPER EXTREMITY MMT:  MMT Right 12/30/2022 Left 12/30/2022 Right 01/12/2023 Right 01/24/2023 Left 01/24/2023  Shoulder flexion 2/5 5/5     Shoulder extension       Shoulder abduction 2/5 5/5     Shoulder adduction       Shoulder internal rotation 5/5 5/5     Shoulder external rotation 2+/5 5/5 3+/5    Middle trapezius       Lower trapezius       Elbow flexion 4/5 5/5     Elbow extension 5/5 5/5     Wrist flexion       Wrist extension       Wrist ulnar deviation       Wrist radial deviation       Wrist pronation       Wrist supination       Grip strength (lbs)    63.9 lbs 64.2, 62 lbs  (Blank rows = not tested)  SHOULDER SPECIAL TESTS: 12/30/2022 (+) Shrug, drop arm on Rt shoulder   JOINT MOBILITY TESTING:  12/30/2022 No specific limitation in passive accessory movement Rt shoulder mid range in all directions.   PALPATION:  12/30/2022 Tightness in Rt upper trap.  TODAY'S TREATMENT:                                                                                                         DATE: 02/02/2023 Therex: UBE forward 4 mins fwd, 4 mins backward lvl 2.0 with 2 min rest break between directions.  Seated Cross arm stretch 15 seconds x 3 bilateral Seated scapular retraction x 15 Seated cane assist AAROM into flexion c eccentric Rt arm only X 10 with cane vertical and X 10 with cane horizontal   Seated green band rows bilateral 2 x 10 Seated red band ER c scapular retraction bilateral 2 X10 Seated red band IR for Rt shoulder 2X10 Seated upper trap stretch 15 seconds x 3 bilateral    DATE: 01/31/2023 Therex: UBE forward 4 mins fwd, 4 mins backward lvl 2.0 with 2 min rest break between directions.  Seated Cross arm stretch 15 seconds x 3 bilateral Seated scapular retraction x 15 Seated wand assist AAROM into flexion c eccentric Rt arm only x 5, then x 10 with AAROM lowering too Seated green band rows bilateral 2 x 15 Seated red band ER c scapular retraction bilateral x15 Seated red band IR for Rt shoulder X 15 Seated upper trap stretch 15 seconds x 3 bilateral  Cold pack X 10 minutes to shoulders not included in treatment time.   TODAY'S TREATMENT:                                                                                                       DATE: 01/26/2023 Therex: UBE forward 4 mins fwd, 4 mins backward lvl 2.0 with 2 min rest break between directions.  Seated Cross arm stretch 15 seconds x 3 bilateral Seated scapular retraction x 10 Seated wand assist AAROM into flexion c eccentric Rt arm only x 5, then x 10 with AAROM lowering too Seated green band rows bilateral 2 x 15 Seated red band ER c scapular retraction bilateral x15 Seated upper trap stretch 15 seconds x 3 bilateral       PATIENT EDUCATION: 12/30/2022 Education details: HEP, POC Person educated: Patient Education method: Programmer, multimedia, Facilities manager, Verbal cues, and Handouts Education  comprehension: verbalized understanding, returned demonstration, and verbal cues required  HOME EXERCISE PROGRAM: Access Code: [facility] URL: [email or web address] Date: 01/26/2023 Prepared by: Chyrel Masson  Exercises - Seated Scapular Retraction  - 3-5 x daily - 7 x weekly - 1 sets - 10 reps - 3-5 hold - Standing Shoulder Posterior Capsule Stretch (Mirrored)  - 2 x daily - 7 x weekly - 1 sets - 5 reps - 15-30 hold - Seated Upper Trapezius Stretch  - 2  x daily - 7 x weekly - 1 sets - 5 reps - 15 hold - Seated Shoulder Flexion AAROM with Dowel  - 1-2 x daily - 7 x weekly - 2-3 sets - 10-15 reps - 2 hold - Standing Isometric Shoulder External Rotation with Doorway (Mirrored)  - 2-3 x daily - 7 x weekly - 1 sets - 10 reps - 5 hold - Shoulder External Rotation and Scapular Retraction with Resistance  - 1 x daily - 7 x weekly - 1-2 sets - 10-15 reps - Standing Bilateral Low Shoulder Row with Anchored Resistance  - 1 x daily - 7 x weekly - 2-3 sets - 10-15 reps  ASSESSMENT:  CLINICAL IMPRESSION: He was doing better in terms of shoulder pain today but still with limitations for functional lifting/reaching or Rt UE that we are working to improve with PT.   OBJECTIVE IMPAIRMENTS: Abnormal gait, decreased activity tolerance, decreased balance, decreased coordination, decreased endurance, decreased mobility, difficulty walking, decreased ROM, decreased strength, impaired perceived functional ability, impaired flexibility, impaired UE functional use, improper body mechanics, postural dysfunction, and pain.   ACTIVITY LIMITATIONS: carrying, lifting, sleeping, bathing, toileting, dressing, self feeding, reach over head, and hygiene/grooming  PARTICIPATION LIMITATIONS: meal prep, cleaning, laundry, interpersonal relationship, and community activity  PERSONAL FACTORS:  [id number], DM,HTN, Rt hip OA (has THA scheduled 03/14/23)  are also affecting patient's functional outcome.   REHAB  POTENTIAL: Good  CLINICAL DECISION MAKING: Stable/uncomplicated  EVALUATION COMPLEXITY: Low   GOALS: Goals reviewed with patient? Yes  SHORT TERM GOALS: (target date for Short term goals are 3 weeks 01/20/2023)  1.Patient will demonstrate independent use of home exercise program to maintain progress from in clinic treatments. Goal status: Met  LONG TERM GOALS: (target dates for all long term goals are 10 weeks  03/10/2023 )   1. Patient will demonstrate/report pain at worst less than or equal to 2/10 to facilitate minimal limitation in daily activity secondary to pain symptoms. Goal status: on going 01/24/2023   2. Patient will demonstrate independent use of home exercise program to facilitate ability to maintain/progress functional gains from skilled physical therapy services. Goal status: on going 01/24/2023   3. Patient will demonstrate FOTO outcome > or = 66 % to indicate reduced disability due to condition. Goal status: on going 01/24/2023   4.  Patient will demonstrate Rt UE MMT 4/5 or greater throughout to facilitate lifting, reaching, carrying at [facility] in daily activity.   Goal status: on going 01/24/2023   5.  Patient will demonstrate Rt GH joint AROM WFL s symptoms to facilitate usual overhead reaching, self care, dressing at PLOF.    Goal status: on going 01/24/2023   6.  Patient will demonstrate/report ability to perform usual housework and self care at [facility].  Goal status: on going 01/24/2023    PLAN:  PT FREQUENCY: 1-2x/week  PT DURATION: 10 weeks  PLANNED INTERVENTIONS: Therapeutic exercises, Therapeutic activity, Neuro Muscular re-education, Balance training, Gait training, Patient/Family education, Joint mobilization, Stair training, DME instructions, Dry Needling, Electrical stimulation, Traction, Cryotherapy, vasopneumatic device Moist heat, Taping, Ultrasound, Ionotophoresis 4mg /ml Dexamethasone, and aquatic therapy ,Manual therapy.  All included unless  contraindicated  PLAN FOR NEXT SESSION:  Monitor soreness response, progress as able.    Ivery Quale, PT, DPT 02/02/23 9:10 AM

## 2023-02-09 ENCOUNTER — Ambulatory Visit: Payer: BC Managed Care – PPO | Admitting: Physical Therapy

## 2023-02-09 ENCOUNTER — Encounter: Payer: Self-pay | Admitting: Physical Therapy

## 2023-02-09 DIAGNOSIS — M25511 Pain in right shoulder: Secondary | ICD-10-CM | POA: Diagnosis not present

## 2023-02-09 DIAGNOSIS — M6281 Muscle weakness (generalized): Secondary | ICD-10-CM

## 2023-02-09 DIAGNOSIS — R293 Abnormal posture: Secondary | ICD-10-CM

## 2023-02-09 DIAGNOSIS — G8929 Other chronic pain: Secondary | ICD-10-CM

## 2023-02-09 NOTE — Therapy (Addendum)
OUTPATIENT PHYSICAL THERAPY  TREATMENT  / DISCHARGE   Patient Name: Richard [name] MRN: [id number] DOB:Oct 07, 1950, 72 y.o., male Today's Date: 02/09/2023  END OF SESSION:  PT End of Session - 02/09/23 1003     Visit Number 8    Number of Visits 20    Date for PT Re-Evaluation 03/10/23    Authorization Type [employer] state $52 copay    Progress Note Due on Visit 10    PT Start Time 1005    PT Stop Time 1043    PT Time Calculation (min) 38 min    Activity Tolerance Patient limited by fatigue    Behavior During Therapy [facility] for tasks assessed/performed                 Past Medical History:  Diagnosis Date   Allergy    seasonal   Arthritis     left knee,  right hip   Back pain    Bilateral swelling of feet    Complication of anesthesia    half awake during last colonscopy   Constipation    Diabetes mellitus    type 2   Hx of adenomatous colonic polyps    2010 6 mm adenoma 10/06/2016 2 5 mm descending polyps    Hyperlipidemia    Hypertension    Joint pain    Lactose intolerance    Past Surgical History:  Procedure Laterality Date   COLONOSCOPY     COLONOSCOPY WITH PROPOFOL N/A 10/06/2016   Procedure: COLONOSCOPY WITH PROPOFOL;  Surgeon: [name], MD;  Location: [facility];  Service: Endoscopy;  Laterality: N/A;   Patient Active Problem List   Diagnosis Date Noted   Right shoulder pain 02/25/2022   Acute right otitis media 02/25/2022   Morbid obesity (HCC) 02/25/2022   Controlled type 2 diabetes mellitus with complication, without long-term current use of insulin (HCC) 06/02/2021   Cough 12/30/2020   Uncontrolled type 2 diabetes mellitus with hyperglycemia (HCC) 02/21/2020   Right hip pain 08/16/2018   Viral upper respiratory tract infection 03/10/2017   Hx of adenomatous colonic polyps    Benign neoplasm of descending colon    Primary hypertension 09/06/2016   Tooth abscess 09/01/2015   Sinusitis 11/08/2014   Sinusitis, acute 10/28/2014   Trigger  ring finger of right hand 02/06/2013   Suspicious mole 02/06/2013   Avulsion of toenail 12/11/2010   Acute sinusitis 08/17/2010   WOUND, FINGER 07/10/2010   SCIATICA, LEFT 09/02/2009   UNSPECIFIED PERIPHERAL VASCULAR DISEASE 07/04/2009   Hyperlipidemia LDL goal <70 05/05/2009   HEARTBURN 04/22/2009   DM (diabetes mellitus) type II uncontrolled, periph vascular disorder (HCC) 01/23/2009    PCP: [name] DO  REFERRING PROVIDER: Tarry Kos, MD  REFERRING DIAG: [phone] (ICD-10-CM) - Chronic right shoulder pain  THERAPY DIAG:  Chronic right shoulder pain  Muscle weakness (generalized)  Abnormal posture  Acute pain of right shoulder  Rationale for Evaluation and Treatment: Rehabilitation  ONSET DATE: May 2024  SUBJECTIVE:  SUBJECTIVE STATEMENT: He says pain is not bad anymore and he can raise his arm higher, just not over his head. He will need to hold on PT for now as he will be really busy coming up.    PERTINENT HISTORY: ZOX:WRUEAVW, DM,HTN, Rt hip OA (has THA scheduled 03/14/23)  PAIN:  NPRS scale: achy pain at times, does not rate today  Pain location: Rt shoulder pain Pain description: achy/tightness Aggravating factors: reaching overhead/shoulder height Relieving factors: some light exercises/stretching   PRECAUTIONS: None  WEIGHT BEARING RESTRICTIONS: No  FALLS:  Has patient fallen in last 6 months? No  LIVING ENVIRONMENT: Lives in: House/apartment  OCCUPATION: Retired   PLOF: Independent, Rt hand dominant.   Housecleaning, trash cans out and back to curb  PATIENT GOALS:  Reduce pain, being able to use arm above head.   OBJECTIVE:   PATIENT SURVEYS:  12/30/2022 FOTO intake:  51   predicted:  66  COGNITION: 12/30/2022 Overall cognitive status:  WFL     SENSATION: 12/30/2022 WFL to light touch  POSTURE: 12/30/2022  Rounded shoulders, FHP  UPPER EXTREMITY ROM:   ROM Right 12/30/2022 Left 12/30/2022 Right 01/24/2023 Sitting  Right 02/09/23  Shoulder flexion 70 deg AROM against gravity in sitting with shrug noted  Supine AROM 100 deg WFL 85 deg AROM with shrug 90 AROM with shrug  Shoulder extension      Shoulder abduction 45 deg AROM against gravity in sitting with shrug noted  70 deg AROM against gravity in sitting with shrug noted 70 with shrug  Shoulder adduction      Shoulder internal rotation      Shoulder external rotation 45 deg PROM in 45 dg abduction supine with muscle guarding  45 deg AROM In sitting with arm at side 60 ROM  Elbow flexion      Elbow extension      Wrist flexion      Wrist extension      Wrist ulnar deviation      Wrist radial deviation      Wrist pronation      Wrist supination      (Blank rows = not tested)  UPPER EXTREMITY MMT:  MMT Right 12/30/2022 Left 12/30/2022 Right 01/12/2023 Right 01/24/2023 Left 01/24/2023 Right 02/09/23  Shoulder flexion 2/5 5/5    2/5  Shoulder extension        Shoulder abduction 2/5 5/5    2/5  Shoulder adduction        Shoulder internal rotation 5/5 5/5    5/5  Shoulder external rotation 2+/5 5/5 3+/5   4/5  Middle trapezius        Lower trapezius        Elbow flexion 4/5 5/5      Elbow extension 5/5 5/5      Wrist flexion        Wrist extension        Wrist ulnar deviation        Wrist radial deviation        Wrist pronation        Wrist supination        Grip strength (lbs)    63.9 lbs 64.2, 62 lbs 63# Rt 72# Lt  (Blank rows = not tested)  SHOULDER SPECIAL TESTS: 12/30/2022 (+) Shrug, drop arm on Rt shoulder   JOINT MOBILITY TESTING:  12/30/2022 No specific limitation in passive accessory movement Rt shoulder mid range in all directions.   PALPATION:  12/30/2022 Tightness in Rt  upper trap.                                                                                                                                                                                                   TODAY'S TREATMENT:                                                                                                        DATE: 02/09/2023 Therex: UBE forward 4 mins fwd, 4 mins backward lvl 2.0 with 2 min rest break between directions.  Seated Cross arm stretch 15 seconds x 3 bilateral Seated cane assist AAROM into flexion c eccentric Rt arm only X 15 with cane vertical and X 15 with cane horizontal   Seated cane assist AAROM into abduction c eccentric Rt arm only X 15 with cane  Seated cane assist AAROM into ER c eccentric Rt arm only X 15 with cane  Seated green band rows bilateral 2 x 10 Seated gren band ER c scapular retraction bilateral X 15 Seated green band IR for Rt shoulder X15 Seated upper trap stretch 15 seconds x 3 bilateral   DATE: 02/02/2023 Therex: UBE forward 4 mins fwd, 4 mins backward lvl 2.0 with 2 min rest break between directions.  Seated Cross arm stretch 15 seconds x 3 bilateral Seated scapular retraction x 15 Seated cane assist AAROM into flexion c eccentric Rt arm only X 10 with cane vertical and X 10 with cane horizontal   Seated green band rows bilateral 2 x 10 Seated red band ER c scapular retraction bilateral 2 X10 Seated red band IR for Rt shoulder 2X10 Seated upper trap stretch 15 seconds x 3 bilateral    DATE: 01/31/2023 Therex: UBE forward 4 mins fwd, 4 mins backward lvl 2.0 with 2 min rest break between directions.  Seated Cross arm stretch 15 seconds x 3 bilateral Seated scapular retraction x 15 Seated wand assist AAROM into flexion c eccentric Rt arm only x 5, then x 10 with AAROM lowering too Seated green band rows bilateral 2 x 15 Seated red band ER c scapular retraction bilateral x15 Seated red band IR for Rt shoulder  X 15 Seated upper trap stretch 15 seconds x 3 bilateral  Cold pack X 10 minutes to  shoulders not included in treatment time.   TODAY'S TREATMENT:                                                                                                       DATE: 01/26/2023 Therex: UBE forward 4 mins fwd, 4 mins backward lvl 2.0 with 2 min rest break between directions.  Seated Cross arm stretch 15 seconds x 3 bilateral Seated scapular retraction x 10 Seated wand assist AAROM into flexion c eccentric Rt arm only x 5, then x 10 with AAROM lowering too Seated green band rows bilateral 2 x 15 Seated red band ER c scapular retraction bilateral x15 Seated upper trap stretch 15 seconds x 3 bilateral       PATIENT EDUCATION: 12/30/2022 Education details: HEP, POC Person educated: Patient Education method: Programmer, multimedia, Facilities manager, Verbal cues, and Handouts Education comprehension: verbalized understanding, returned demonstration, and verbal cues required  HOME EXERCISE PROGRAM: Access Code: [facility] URL: [email or web address] Date: 01/26/2023 Prepared by: Chyrel Masson  Exercises - Seated Scapular Retraction  - 3-5 x daily - 7 x weekly - 1 sets - 10 reps - 3-5 hold - Standing Shoulder Posterior Capsule Stretch (Mirrored)  - 2 x daily - 7 x weekly - 1 sets - 5 reps - 15-30 hold - Seated Upper Trapezius Stretch  - 2 x daily - 7 x weekly - 1 sets - 5 reps - 15 hold - Seated Shoulder Flexion AAROM with Dowel  - 1-2 x daily - 7 x weekly - 2-3 sets - 10-15 reps - 2 hold - Standing Isometric Shoulder External Rotation with Doorway (Mirrored)  - 2-3 x daily - 7 x weekly - 1 sets - 10 reps - 5 hold - Shoulder External Rotation and Scapular Retraction with Resistance  - 1 x daily - 7 x weekly - 1-2 sets - 10-15 reps - Standing Bilateral Low Shoulder Row with Anchored Resistance  - 1 x daily - 7 x weekly - 2-3 sets - 10-15 reps  ASSESSMENT:  CLINICAL IMPRESSION: He has improved overall in his pain levels and shoulder ROM. However still limited with strength and overhead  ROM due to likely RTC tear. At this point he relays he is very busy coming up and might not be able to return to PT before his hip replacement surgery. I will Hold PT for now in the event he is able to come back before his hip surgery, if not will DC then and would start an new episode after his hip surgery if MD places referral for PT.  OBJECTIVE IMPAIRMENTS: Abnormal gait, decreased activity tolerance, decreased balance, decreased coordination, decreased endurance, decreased mobility, difficulty walking, decreased ROM, decreased strength, impaired perceived functional ability, impaired flexibility, impaired UE functional use, improper body mechanics, postural dysfunction, and pain.   ACTIVITY LIMITATIONS: carrying, lifting, sleeping, bathing, toileting, dressing, self feeding, reach over head, and hygiene/grooming  PARTICIPATION LIMITATIONS: meal prep, cleaning, laundry, interpersonal relationship, and community activity  PERSONAL FACTORS:  ZOX:WRUEAVW, DM,HTN, Rt hip OA (has THA scheduled 03/14/23)  are also affecting patient's functional outcome.   REHAB POTENTIAL: Good  CLINICAL DECISION MAKING: Stable/uncomplicated  EVALUATION COMPLEXITY: Low   GOALS: Goals reviewed with patient? Yes  SHORT TERM GOALS: (target date for Short term goals are 3 weeks 01/20/2023)  1.Patient will demonstrate independent use of home exercise program to maintain progress from in clinic treatments. Goal status: Met  LONG TERM GOALS: (target dates for all long term goals are 10 weeks  03/10/2023 )   1. Patient will demonstrate/report pain at worst less than or equal to 2/10 to facilitate minimal limitation in daily activity secondary to pain symptoms. Goal status: on going 01/24/2023   2. Patient will demonstrate independent use of home exercise program to facilitate ability to maintain/progress functional gains from skilled physical therapy services. Goal status: on going 01/24/2023   3. Patient will  demonstrate FOTO outcome > or = 66 % to indicate reduced disability due to condition. Goal status: on going 01/24/2023   4.  Patient will demonstrate Rt UE MMT 4/5 or greater throughout to facilitate lifting, reaching, carrying at [facility] in daily activity.   Goal status: on going 01/24/2023   5.  Patient will demonstrate Rt GH joint AROM WFL s symptoms to facilitate usual overhead reaching, self care, dressing at PLOF.    Goal status: on going 01/24/2023   6.  Patient will demonstrate/report ability to perform usual housework and self care at [facility].  Goal status: on going 01/24/2023    PLAN:  PT FREQUENCY: 1-2x/week  PT DURATION: 10 weeks  PLANNED INTERVENTIONS: Therapeutic exercises, Therapeutic activity, Neuro Muscular re-education, Balance training, Gait training, Patient/Family education, Joint mobilization, Stair training, DME instructions, Dry Needling, Electrical stimulation, Traction, Cryotherapy, vasopneumatic device Moist heat, Taping, Ultrasound, Ionotophoresis 4mg /ml Dexamethasone, and aquatic therapy ,Manual therapy.  All included unless contraindicated  PLAN FOR NEXT SESSION:  Hold PT for now in the event he is able to come back before his hip surgery   Ivery Quale, PT, DPT 02/09/23 10:48 AM   PHYSICAL THERAPY DISCHARGE SUMMARY  Visits from Start of Care: 8  Current functional level related to goals / functional outcomes: See note   Remaining deficits: See note   Education / Equipment: HEP  Patient goals were partially met. Patient is being discharged due to  having hip surgery.  Chyrel Masson, PT, DPT, OCS, ATC 03/22/23  11:23 AM

## 2023-02-15 NOTE — H&P (Signed)
TOTAL HIP ADMISSION H&P  Patient is admitted for right total hip arthroplasty.  Subjective:  Chief Complaint: Right hip pain  HPI: Richard [name], 72 y.o. male, has a history of pain and functional disability in the right hip due to arthritis and patient has failed non-surgical conservative treatments for greater than 12 weeks to include NSAID's and/or analgesics, corticosteriod injections, use of assistive devices, and activity modification. Onset of symptoms was gradual, starting  several  years ago with gradually worsening course since that time. The patient noted no past surgery on the right hip. Patient currently rates pain in the right hip at 8 out of 10 with activity. Patient has night pain, worsening of pain with activity and weight bearing, pain that interfers with activities of daily living, and pain with passive range of motion. 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 The condition is worse on the right than the left. 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 This condition presents safety issues increasing the risk of falls. There is no current active infection.  Patient Active Problem List   Diagnosis Date Noted   Right shoulder pain 02/25/2022   Acute right otitis media 02/25/2022   Morbid obesity (HCC) 02/25/2022   Controlled type 2 diabetes mellitus with complication, without long-term current use of insulin (HCC) 06/02/2021   Cough 12/30/2020   Uncontrolled type 2 diabetes mellitus with hyperglycemia (HCC) 02/21/2020   Right hip pain 08/16/2018   Viral upper respiratory tract infection 03/10/2017   Hx of adenomatous colonic polyps    Benign neoplasm of descending colon    Primary hypertension 09/06/2016   Tooth abscess 09/01/2015   Sinusitis 11/08/2014   Sinusitis, acute 10/28/2014   Trigger ring finger of right hand 02/06/2013   Suspicious mole 02/06/2013   Avulsion of  toenail 12/11/2010   Acute sinusitis 08/17/2010   WOUND, FINGER 07/10/2010   SCIATICA, LEFT 09/02/2009   UNSPECIFIED PERIPHERAL VASCULAR DISEASE 07/04/2009   Hyperlipidemia LDL goal <70 05/05/2009   HEARTBURN 04/22/2009   DM (diabetes mellitus) type II uncontrolled, periph vascular disorder (HCC) 01/23/2009    Past Medical History:  Diagnosis Date   Allergy    seasonal   Arthritis     left knee,  right hip   Back pain    Bilateral swelling of feet    Complication of anesthesia    half awake during last colonscopy   Constipation    Diabetes mellitus    type 2   Hx of adenomatous colonic polyps    2010 6 mm adenoma 10/06/2016 2 5 mm descending polyps    Hyperlipidemia    Hypertension    Joint pain    Lactose intolerance     Past Surgical History:  Procedure Laterality Date   COLONOSCOPY     COLONOSCOPY WITH PROPOFOL N/A 10/06/2016   Procedure: COLONOSCOPY WITH PROPOFOL;  Surgeon: [name], MD;  Location: [facility];  Service: Endoscopy;  Laterality: N/A;    Prior to Admission medications   Medication Sig Start Date End Date Taking? Authorizing Provider  Accu-Chek Softclix Lancets lancets Check blood sugars once daily 12/01/21   Zola Button, Myrene Buddy R, DO  ammonium lactate (AMLACTIN) 12 % cream APPLY TOPICALLY AS NEEDED FOR DRY SKIN. 03/11/22   Zola Button, Grayling Congress, DO  atorvastatin (LIPITOR) 40 MG tablet TAKE 1 TABLET BY MOUTH EVERYDAY AT BEDTIME 08/24/22   [name],  DO  Blood Glucose Monitoring Suppl (ACCU-CHEK GUIDE ME) w/Device KIT Check blood sugars once daily 12/01/21   Zola Button, Grayling Congress, DO  dapagliflozin propanediol (FARXIGA) 5 MG TABS tablet Take 1 tablet (5 mg total) by mouth daily. 11/30/22   [name], DO  diltiazem (CARDIZEM CD) 180 MG 24 hr capsule Take 1 capsule (180 mg total) by mouth daily. 11/30/22   [name], DO  glucose blood (ACCU-CHEK GUIDE) test strip Check blood sugars once daily 12/01/21   Zola Button, Grayling Congress, DO  ibuprofen (ADVIL) 200 MG tablet Take 600 mg by mouth every 6 (six) hours as needed.    [provider]  Insulin Pen Needle (B-D UF III MINI PEN NEEDLES) 31G X 5 MM MISC USE AS DIRECTED 05/21/22   Zola Button, Grayling Congress, DO  lidocaine (XYLOCAINE) 2 % solution Use as directed 15 mLs in the mouth or throat every 6 (six) hours as needed for mouth pain (gargle and spit). 08/10/22   Olive Bass, FNP  lubiprostone (AMITIZA) 24 MCG capsule Take 1 capsule (24 mcg total) by mouth 2 (two) times daily with a meal. 01/18/23   Zola Button, Grayling Congress, DO  metFORMIN (GLUCOPHAGE) 500 MG tablet TAKE 2 TABLETS (1,000 MG TOTAL) BY MOUTH 2 (TWO) TIMES DAILY WITH A MEAL. 11/15/22   Zola Button, Yvonne R, DO  naproxen sodium (ALEVE) 220 MG tablet Take 220 mg by mouth. 3 tablets    [provider]  olmesartan-hydrochlorothiazide (BENICAR HCT) 40-25 MG tablet Take 1 tablet by mouth daily. 11/30/22   [name], DO  oxyCODONE-acetaminophen (PERCOCET) 10-325 MG tablet Take 1 tablet by mouth every 8 (eight) hours as needed for pain. 01/26/23   [name], DO  promethazine-dextromethorphan (PROMETHAZINE-DM) 6.25-15 MG/5ML syrup Take 5 mLs by mouth 4 (four) times daily as needed. 01/18/23   [name], DO  tiZANidine (ZANAFLEX) 4 MG tablet Take 1 tablet (4 mg total) by mouth every 6 (six) hours as needed for muscle spasms. 11/30/22   [name], DO  triamcinolone cream (KENALOG) 0.1 % Apply 1 Application topically 2 (two) times daily. 11/30/22   [name], DO  Vitamin D, Ergocalciferol, (DRISDOL) 1.25 MG (50000 UNIT) CAPS capsule Take 1 capsule (50,000 Units total) by mouth every 7 (seven) days. 01/11/23   [name], MD    Allergies  Allergen Reactions   Semaglutide Other (See Comments)    RYBELSUS. Severe headaches.     Social History   Socioeconomic History   Marital status: Married    Spouse name: Not on file   Number of children:  Not on file   Years of education: Not on file   Highest education level: Not on file  Occupational History   Not on file  Tobacco Use   Smoking status: Never   Smokeless tobacco: Never  Substance and Sexual Activity   Alcohol use: Yes    Comment: occasionally    Drug use: No   Sexual activity: Not on file  Other Topics Concern   Not on file  Social History Narrative   Not on file   Social Determinants of Health   Financial Resource Strain: Not on file  Food Insecurity: Not on file  Transportation Needs: Not on file  Physical Activity: Not on file  Stress: Not on file  Social Connections: Not on file  Intimate Partner Violence: Not on file  Tobacco Use: Low Risk  (02/09/2023)   Patient History    Smoking Tobacco Use: Never    Smokeless Tobacco Use: Never    Passive Exposure: Not on file   Social History   Substance and Sexual Activity  Alcohol Use Yes   Comment: occasionally     Family History  Problem Relation Age of Onset   Hyperlipidemia Mother    Hypertension Mother    Diabetes Mother    Diabetes Sister    Hypertension Sister    Stroke Sister    Diabetes Brother    Colon cancer Neg Hx    Colon polyps Neg Hx    Rectal cancer Neg Hx    Stomach cancer Neg Hx    Esophageal cancer Neg Hx     ROS   Objective:  Physical Exam: Well nourished and well developed.  General: Alert and oriented x3, cooperative and pleasant, no acute distress.  Head: normocephalic, atraumatic, neck supple.  Eyes: EOMI. Abdomen: non-tender to palpation and soft, normoactive bowel sounds. Musculoskeletal: - Well-developed male, alert and oriented, no apparent distress.  - Severe antalgic gait pattern on the right.  - Right hip can be flexed to about 100, no internal or external rotation, no abduction.  - Left hip flexion 110, rotation in 10 out of 30, abduction 30.  - No trochanteric tenderness.  Calves soft and nontender. Motor function intact in LE. Strength 5/5 LE  bilaterally. Neuro: Distal pulses 2+. Sensation to light touch intact in LE.  Vital signs in last 24 hours: BP: ()/()  Arterial Line BP: ()/()   Imaging Review Plain radiographs demonstrate severe degenerative joint disease of the right hip. The bone quality appears to be adequate for age and reported activity level.  Assessment/Plan:  End stage arthritis, right hip  The patient history, physical examination, clinical judgement of the provider and imaging studies are consistent with end stage degenerative joint disease of the right hip and total hip arthroplasty is deemed medically necessary. The treatment options including medical management, injection therapy, arthroscopy and arthroplasty were discussed at length. The risks and benefits of total hip arthroplasty were presented and reviewed. The risks due to aseptic loosening, infection, stiffness, dislocation/subluxation, thromboembolic complications and other imponderables were discussed. The patient acknowledged the explanation, agreed to proceed with the plan and consent was signed. Patient is being admitted for inpatient treatment for surgery, pain control, PT, OT, prophylactic antibiotics, VTE prophylaxis, progressive ambulation and ADLs and discharge planning.The patient is planning to be discharged  home .  Therapy Plans: HEP Disposition: Home with Wife Planned DVT Prophylaxis: Aspirin 81 mg BID DME Needed: None PCP: Seabron Spates, DO (EPIC note) TXA: IV Allergies: NKDA Anesthesia Concerns: None BMI: 39.2 Last HgbA1c: 6.0% (11/2022)  Pharm[facility]: [facility]  Other: -Percocet 10 mg TID - discussed dilaudid for breakthrough pain  - Patient was instructed on what medications to stop prior to surgery. - Follow-up visit in 2 weeks with Dr. Lequita Halt - Begin physical therapy following surgery - Pre-operative lab work as pre-surgical testing - Prescriptions will be provided in hospital at time of discharge  R. Arcola Jansky, PA-C Orthopedic Surgery [facility] [place]

## 2023-02-22 ENCOUNTER — Ambulatory Visit: Payer: BC Managed Care – PPO | Admitting: Family Medicine

## 2023-02-22 ENCOUNTER — Telehealth: Payer: Self-pay

## 2023-02-22 ENCOUNTER — Encounter: Payer: Self-pay | Admitting: Family Medicine

## 2023-02-22 VITALS — BP 150/70 | HR 87 | Temp 98.0°F | Resp 20 | Ht 69.0 in | Wt 286.6 lb

## 2023-02-22 DIAGNOSIS — Z7984 Long term (current) use of oral hypoglycemic drugs: Secondary | ICD-10-CM

## 2023-02-22 DIAGNOSIS — E785 Hyperlipidemia, unspecified: Secondary | ICD-10-CM

## 2023-02-22 DIAGNOSIS — I4510 Unspecified right bundle-branch block: Secondary | ICD-10-CM | POA: Diagnosis not present

## 2023-02-22 DIAGNOSIS — Z01818 Encounter for other preprocedural examination: Secondary | ICD-10-CM

## 2023-02-22 DIAGNOSIS — E1159 Type 2 diabetes mellitus with other circulatory complications: Secondary | ICD-10-CM

## 2023-02-22 DIAGNOSIS — I152 Hypertension secondary to endocrine disorders: Secondary | ICD-10-CM

## 2023-02-22 DIAGNOSIS — M25551 Pain in right hip: Secondary | ICD-10-CM

## 2023-02-22 DIAGNOSIS — E1165 Type 2 diabetes mellitus with hyperglycemia: Secondary | ICD-10-CM

## 2023-02-22 DIAGNOSIS — R55 Syncope and collapse: Secondary | ICD-10-CM

## 2023-02-22 MED ORDER — OXYCODONE-ACETAMINOPHEN 10-325 MG PO TABS
1.0000 | ORAL_TABLET | Freq: Three times a day (TID) | ORAL | 0 refills | Status: DC | PRN
Start: 2023-02-22 — End: 2023-03-24

## 2023-02-22 MED ORDER — TIZANIDINE HCL 4 MG PO TABS
4.0000 mg | ORAL_TABLET | Freq: Four times a day (QID) | ORAL | 1 refills | Status: DC | PRN
Start: 2023-02-22 — End: 2023-05-06

## 2023-02-22 NOTE — Progress Notes (Addendum)
Established Patient Office Visit  Subjective   Patient ID: Richard [name], male    DOB: 1950-10-29  Age: 72 y.o. MRN: [id number]  Chief Complaint  Patient presents with   Fall    Pt states last night after dinner. Pt states taking Tizanidine and Perc. Pt states falling forward. Bilateral shoulder pain and back pain.    Hypotension   Diabetes    +      HPI Discussed the use of AI scribe software for clinical note transcription with the patient, who gave verbal consent to proceed.  History of Present Illness   The patient, with a history of obesity and chronic hip pain, presents after a recent fall. He reports a sudden loss of consciousness while seated, resulting in a face-first fall. He regained consciousness immediately after the fall, but was unable to get up due to hip pain. Emergency medical services were called and the patient's vitals were taken. The patient's blood pressure was noted to be low at 92/55, but other vitals were within normal limits. He denied any chest pain, shortness of breath, or palpitations.  The patient also reports a recent weight gain of 19 pounds in four days according to his home scale, but this is inconsistent with his weight measured at the doctor's office. He is scheduled for right hip surgery in two weeks.  The patient also mentions a recent episode of hip and buttock pain, which he describes as a tight muscle. He took a muscle relaxer and oxycodone for the pain, along with an alcoholic drink. The patient suspects this combination may have contributed to his fall.      Patient Active Problem List   Diagnosis Date Noted   Right bundle branch block 02/23/2023   Pre-op evaluation 02/23/2023   Syncope 02/23/2023   Right shoulder pain 02/25/2022   Acute right otitis media 02/25/2022   Morbid obesity (HCC) 02/25/2022   Type 2 diabetes mellitus with hyperglycemia, without long-term current use of insulin (HCC) 06/02/2021   Cough 12/30/2020    Uncontrolled type 2 diabetes mellitus with hyperglycemia (HCC) 02/21/2020   Right hip pain 08/16/2018   Viral upper respiratory tract infection 03/10/2017   Hx of adenomatous colonic polyps    Benign neoplasm of descending colon    Hypertension associated with diabetes (HCC) 09/06/2016   Tooth abscess 09/01/2015   Sinusitis 11/08/2014   Sinusitis, acute 10/28/2014   Trigger ring finger of right hand 02/06/2013   Suspicious mole 02/06/2013   Avulsion of toenail 12/11/2010   Acute sinusitis 08/17/2010   WOUND, FINGER 07/10/2010   SCIATICA, LEFT 09/02/2009   UNSPECIFIED PERIPHERAL VASCULAR DISEASE 07/04/2009   Hyperlipidemia LDL goal <70 05/05/2009   HEARTBURN 04/22/2009   DM (diabetes mellitus) type II uncontrolled, periph vascular disorder (HCC) 01/23/2009   Past Medical History:  Diagnosis Date   Allergy    seasonal   Arthritis     left knee,  right hip   Back pain    Bilateral swelling of feet    Complication of anesthesia    half awake during last colonscopy   Constipation    Diabetes mellitus    type 2   Hx of adenomatous colonic polyps    2010 6 mm adenoma 10/06/2016 2 5 mm descending polyps    Hyperlipidemia    Hypertension    Joint pain    Lactose intolerance    Past Surgical History:  Procedure Laterality Date   COLONOSCOPY     COLONOSCOPY WITH PROPOFOL  N/A 10/06/2016   Procedure: COLONOSCOPY WITH PROPOFOL;  Surgeon: [name], MD;  Location: [facility];  Service: Endoscopy;  Laterality: N/A;   Social History   Tobacco Use   Smoking status: Never   Smokeless tobacco: Never  Substance Use Topics   Alcohol use: Yes    Comment: occasionally    Drug use: No   Social History   Socioeconomic History   Marital status: Married    Spouse name: Not on file   Number of children: Not on file   Years of education: Not on file   Highest education level: Not on file  Occupational History   Not on file  Tobacco Use   Smoking status: Never   Smokeless  tobacco: Never  Substance and Sexual Activity   Alcohol use: Yes    Comment: occasionally    Drug use: No   Sexual activity: Not on file  Other Topics Concern   Not on file  Social History Narrative   Not on file   Social Determinants of Health   Financial Resource Strain: Not on file  Food Insecurity: Not on file  Transportation Needs: Not on file  Physical Activity: Not on file  Stress: Not on file  Social Connections: Not on file  Intimate Partner Violence: Not on file   Family Status  Relation Name Status   Mother  (Not Specified)   Sister  (Not Specified)   Brother  (Not Specified)   Neg Hx  (Not Specified)  No partnership data on file   Family History  Problem Relation Age of Onset   Hyperlipidemia Mother    Hypertension Mother    Diabetes Mother    Diabetes Sister    Hypertension Sister    Stroke Sister    Diabetes Brother    Colon cancer Neg Hx    Colon polyps Neg Hx    Rectal cancer Neg Hx    Stomach cancer Neg Hx    Esophageal cancer Neg Hx    Allergies  Allergen Reactions   Semaglutide Other (See Comments)    RYBELSUS. Severe headaches.     Review of Systems  Constitutional:  Negative for chills, fever and malaise/fatigue.  HENT:  Negative for congestion and hearing loss.   Eyes:  Negative for blurred vision and discharge.  Respiratory:  Negative for cough, sputum production and shortness of breath.   Cardiovascular:  Negative for chest pain, palpitations and leg swelling.  Gastrointestinal:  Negative for abdominal pain, blood in stool, constipation, diarrhea, heartburn, nausea and vomiting.  Genitourinary:  Negative for dysuria, frequency, hematuria and urgency.  Musculoskeletal:  Negative for back pain, falls and myalgias.  Skin:  Negative for rash.  Neurological:  Negative for dizziness, sensory change, loss of consciousness, weakness and headaches.  Endo/Heme/Allergies:  Negative for environmental allergies. Does not bruise/bleed easily.   Psychiatric/Behavioral:  Negative for depression and suicidal ideas. The patient is not nervous/anxious and does not have insomnia.       Objective:     BP (!) 150/70 (BP Location: Left Arm, Patient Position: Sitting, Cuff Size: Large)   Pulse 87   Temp 98 F (36.7 C) (Oral)   Resp 20   Ht 5\' 9"  (1.753 m)   Wt 286 lb 9.6 oz (130 kg)   SpO2 98%   BMI 42.32 kg/m  BP Readings from Last 3 Encounters:  02/22/23 (!) 150/70  02/01/23 (!) 158/62  01/18/23 (!) 160/70   Wt Readings from Last 3 Encounters:  02/22/23 286 lb 9.6 oz (130 kg)  01/18/23 282 lb 6.4 oz (128.1 kg)  01/11/23 278 lb (126.1 kg)   SpO2 Readings from Last 3 Encounters:  02/22/23 98%  02/01/23 98%  01/18/23 96%      Physical Exam Vitals and nursing note reviewed.  Constitutional:      General: He is not in acute distress.    Appearance: Normal appearance. He is well-developed.  HENT:     Head: Normocephalic and atraumatic.     Right Ear: Tympanic membrane, ear canal and external ear normal. There is no impacted cerumen.     Left Ear: Tympanic membrane, ear canal and external ear normal. There is no impacted cerumen.     Nose: Nose normal.     Mouth/Throat:     Mouth: Mucous membranes are moist.     Pharynx: Oropharynx is clear. No oropharyngeal exudate or posterior oropharyngeal erythema.  Eyes:     General: No scleral icterus.       Right eye: No discharge.        Left eye: No discharge.     Conjunctiva/sclera: Conjunctivae normal.     Pupils: Pupils are equal, round, and reactive to light.  Neck:     Thyroid: No thyromegaly.     Vascular: No JVD.  Cardiovascular:     Rate and Rhythm: Normal rate and regular rhythm.     Heart sounds: Normal heart sounds. No murmur heard. Pulmonary:     Effort: Pulmonary effort is normal. No respiratory distress.     Breath sounds: Normal breath sounds.  Abdominal:     General: Bowel sounds are normal. There is no distension.     Palpations: Abdomen is soft.  There is no mass.     Tenderness: There is no abdominal tenderness. There is no guarding or rebound.  Musculoskeletal:        General: Tenderness present. Normal range of motion.     Cervical back: Normal range of motion and neck supple.     Right lower leg: No edema.     Left lower leg: No edema.  Lymphadenopathy:     Cervical: No cervical adenopathy.  Skin:    General: Skin is warm and dry.     Findings: No erythema or rash.  Neurological:     General: No focal deficit present.     Mental Status: He is alert and oriented to person, place, and time.     Cranial Nerves: No cranial nerve deficit.     Motor: No abnormal muscle tone.     Deep Tendon Reflexes: Reflexes are normal and symmetric. Reflexes normal.  Psychiatric:        Mood and Affect: Mood normal.        Behavior: Behavior normal.        Thought Content: Thought content normal.        Judgment: Judgment normal.      No results found for any visits on 02/22/23.  Last CBC Lab Results  Component Value Date   WBC 8.9 11/30/2022   HGB 14.2 11/30/2022   HCT 43.7 11/30/2022   MCV 100.0 11/30/2022   MCH 32.5 05/21/2022   RDW 13.3 11/30/2022   PLT 317.0 11/30/2022   Last metabolic panel Lab Results  Component Value Date   GLUCOSE 91 11/30/2022   NA 136 11/30/2022   K 4.5 11/30/2022   CL 96 11/30/2022   CO2 28 11/30/2022   BUN 19 11/30/2022   CREATININE  0.90 11/30/2022   GFR 86.92 11/30/2022   CALCIUM 9.8 11/30/2022   PROT 7.9 11/30/2022   ALBUMIN 4.5 11/30/2022   BILITOT 1.3 (H) 11/30/2022   ALKPHOS 57 11/30/2022   AST 12 11/30/2022   ALT 11 11/30/2022   Last lipids Lab Results  Component Value Date   CHOL 170 11/30/2022   HDL 77.00 11/30/2022   LDLCALC 80 11/30/2022   LDLDIRECT 133.0 12/05/2017   TRIG 64.0 11/30/2022   CHOLHDL 2 11/30/2022   Last hemoglobin A1c Lab Results  Component Value Date   HGBA1C 6.0 11/30/2022   Last thyroid functions Lab Results  Component Value Date   TSH 0.40  11/30/2022   T3TOTAL 90 06/22/2022   Last vitamin D Lab Results  Component Value Date   VD25OH 39.18 11/30/2022   Last vitamin B12 and Folate Lab Results  Component Value Date   VITAMINB12 121 (L) 11/30/2022   FOLATE 9.6 06/22/2022  EKG--  new RBBB    The 10-year ASCVD risk score (Arnett DK, et al., 2019) is: 36.5%    Assessment & Plan:   Problem List Items Addressed This Visit       Unprioritized   Right hip pain   Relevant Medications   oxyCODONE-acetaminophen (PERCOCET) 10-325 MG tablet   tiZANidine (ZANAFLEX) 4 MG tablet   Other Relevant Orders   Cardiac event monitor   Type 2 diabetes mellitus with hyperglycemia, without long-term current use of insulin (HCC)    hgba1c acceptable, minimize simple carbs. Increase exercise as tolerated. Continue current meds  Lab Results  Component Value Date   HGBA1C 6.0 11/30/2022         Syncope    Suspect due to etoh, with narcotic and muscle relaxer But pt has new RBBB--- cardiology to see him tomorrow       Relevant Orders   Cardiac event monitor   Lipid panel   CBC with Differential/Platelet   Comprehensive metabolic panel   Hemoglobin A1c   Right bundle branch block    New Cardiology to see pt tomorrow Event monitor       Relevant Orders   Ambulatory referral to Cardiology   Cardiac event monitor   Lipid panel   CBC with Differential/Platelet   Comprehensive metabolic panel   Hemoglobin A1c   Pre-op evaluation - Primary    Pt to have hip replacement in 2 weeks Cardiology clearance needed due to RBBB and ?syncope       Relevant Orders   EKG 12-Lead (Completed)   Ambulatory referral to Cardiology   Lipid panel   CBC with Differential/Platelet   Comprehensive metabolic panel   Hemoglobin A1c   PTT   Protime-INR   Hypertension associated with diabetes (HCC)    Running slightly high===  pt in significant pain due to hip and recent fall      Hyperlipidemia LDL goal <70    Encourage heart healthy  diet such as MIND or DASH diet, increase exercise, avoid trans fats, simple carbohydrates and processed foods, consider a krill or fish or flaxseed oil cap daily.       Assessment and Plan    Syncope Sudden loss of consciousness while sitting, likely due to combination of medications and alcohol. No chest pain, shortness of breath, or palpitations reported. Patient regained consciousness immediately after falling. -Perform EKG to rule out cardiac causes of syncope.  Right Hip Pain Scheduled for right total hip replacement surgery in two weeks. Pain in both hips and lower back. -  Obtain surgical clearance for the patient. -Continue current pain management regimen.  General Health Maintenance -Weight monitoring, discrepancy in weight measurements between home and clinic scales. Patient's home scale reading at 269 lbs. -Continue regular weight monitoring and healthy lifestyle habits.        No follow-ups on file.    Lelon Perla Chase, DO 03/23/2023---- pt saw cardiology,  stress test and echo to be done,  event monitor not needed [name], DO

## 2023-02-22 NOTE — Telephone Encounter (Signed)
Preop clearance and EKG faxed over to [facility].

## 2023-02-23 ENCOUNTER — Encounter: Payer: Self-pay | Admitting: Family Medicine

## 2023-02-23 ENCOUNTER — Ambulatory Visit: Payer: BC Managed Care – PPO | Admitting: Cardiology

## 2023-02-23 ENCOUNTER — Encounter: Payer: Self-pay | Admitting: Cardiology

## 2023-02-23 ENCOUNTER — Telehealth: Payer: Self-pay | Admitting: Family Medicine

## 2023-02-23 DIAGNOSIS — Z01818 Encounter for other preprocedural examination: Secondary | ICD-10-CM | POA: Insufficient documentation

## 2023-02-23 DIAGNOSIS — R55 Syncope and collapse: Secondary | ICD-10-CM | POA: Insufficient documentation

## 2023-02-23 DIAGNOSIS — I4510 Unspecified right bundle-branch block: Secondary | ICD-10-CM | POA: Insufficient documentation

## 2023-02-23 LAB — CBC WITH DIFFERENTIAL/PLATELET
Basophils Absolute: 0.1 10*3/uL (ref 0.0–0.1)
Basophils Relative: 1 % (ref 0.0–3.0)
Eosinophils Absolute: 0.1 10*3/uL (ref 0.0–0.7)
Eosinophils Relative: 0.8 % (ref 0.0–5.0)
HCT: 39.4 % (ref 39.0–52.0)
Hemoglobin: 13 g/dL (ref 13.0–17.0)
Lymphocytes Relative: 13.6 % (ref 12.0–46.0)
Lymphs Abs: 1.1 10*3/uL (ref 0.7–4.0)
MCHC: 33 g/dL (ref 30.0–36.0)
MCV: 100.4 fl — ABNORMAL HIGH (ref 78.0–100.0)
Monocytes Absolute: 0.7 10*3/uL (ref 0.1–1.0)
Monocytes Relative: 9.2 % (ref 3.0–12.0)
Neutro Abs: 5.9 10*3/uL (ref 1.4–7.7)
Neutrophils Relative %: 75.4 % (ref 43.0–77.0)
Platelets: 255 10*3/uL (ref 150.0–400.0)
RBC: 3.93 Mil/uL — ABNORMAL LOW (ref 4.22–5.81)
RDW: 13.2 % (ref 11.5–15.5)
WBC: 7.9 10*3/uL (ref 4.0–10.5)

## 2023-02-23 LAB — COMPREHENSIVE METABOLIC PANEL
ALT: 10 U/L (ref 0–53)
AST: 16 U/L (ref 0–37)
Albumin: 4.2 g/dL (ref 3.5–5.2)
Alkaline Phosphatase: 64 U/L (ref 39–117)
BUN: 33 mg/dL — ABNORMAL HIGH (ref 6–23)
CO2: 28 meq/L (ref 19–32)
Calcium: 9.7 mg/dL (ref 8.4–10.5)
Chloride: 98 meq/L (ref 96–112)
Creatinine, Ser: 1.23 mg/dL (ref 0.40–1.50)
GFR: 59.65 mL/min — ABNORMAL LOW (ref >60.00)
Glucose, Bld: 91 mg/dL (ref 70–99)
Potassium: 4.5 meq/L (ref 3.5–5.1)
Sodium: 136 meq/L (ref 135–145)
Total Bilirubin: 1.3 mg/dL — ABNORMAL HIGH (ref 0.2–1.2)
Total Protein: 7.1 g/dL (ref 6.0–8.3)

## 2023-02-23 LAB — APTT: aPTT: 27.9 s (ref 25.4–36.8)

## 2023-02-23 LAB — LIPID PANEL
Cholesterol: 186 mg/dL (ref 0–200)
HDL: 75.8 mg/dL (ref >39.00)
LDL Cholesterol: 83 mg/dL (ref 0–99)
NonHDL: 110.27
Total CHOL/HDL Ratio: 2
Triglycerides: 138 mg/dL (ref 0.0–149.0)
VLDL: 27.6 mg/dL (ref 0.0–40.0)

## 2023-02-23 LAB — HEMOGLOBIN A1C: Hgb A1c MFr Bld: 6.3 % (ref 4.6–6.5)

## 2023-02-23 LAB — PROTIME-INR
INR: 1.1 ratio — ABNORMAL HIGH (ref 0.8–1.0)
Prothrombin Time: 11.2 s (ref 9.6–13.1)

## 2023-02-23 NOTE — Assessment & Plan Note (Signed)
Suspect due to etoh, with narcotic and muscle relaxer But pt has new RBBB--- cardiology to see him tomorrow

## 2023-02-23 NOTE — Assessment & Plan Note (Signed)
Pt to have hip replacement in 2 weeks Cardiology clearance needed due to RBBB and ?syncope

## 2023-02-23 NOTE — Assessment & Plan Note (Signed)
Running slightly high===  pt in significant pain due to hip and recent fall

## 2023-02-23 NOTE — Patient Instructions (Signed)
Right Bundle Branch Block  Right bundle branch block (RBBB) is a problem with the way that electrical impulses pass through the heart (electrical conduction abnormality). The heart needs an electrical signal to beat like it should. The electrical signal for a heartbeat starts in the upper chambers of the heart (atria) and then goes to the two lower chambers (left and rightventricles). An RBBB is a block of the pathway that carries the signal to the right ventricle. If you have RBBB, the right side of your heart beats a little slower than the left side. RBBB may be a warning sign of heart disease or a lung problem. What are the causes? RBBB may be caused by: A heart attack. This is also called a myocardial infarction. Congenital heart disease. This is a heart problem that you are born with. A pulmonary embolism. This is a blood clot that has moved into your lung. Myocarditis. This is an infection of the heart muscle. High blood pressure. In some cases, the cause may not be known. What increases the risk? You are more likely to get RBBB if: You are male. You are 5 years of age or older. You have heart disease. You have had a heart attack or heart surgery. You have a bigger heart than you should (enlarged heart). There is a hole in the walls between the chambers of your heart (septal defect). What are the signs or symptoms? In most cases, RBBB does not cause symptoms. How is this diagnosed? RBBB may be diagnosed based on an electrocardiogram (ECG). It is often found when an ECG is done as part of a physical exam. You may also have imaging tests done. These may include: Chest X-rays. Echocardiogram. How is this treated? You may not need treatment if you do not have symptoms or any other heart problems. But you may need to see your health care provider more often to make sure you do not get other heart or lung problems. You may also need treatment if a condition is causing the RBBB. Follow  these instructions at home: Lifestyle  Ask your provider about what you may eat and drink. Eat foods that are good for your heart and stay at a healthy weight. Work with a diet and Hospital doctor (dietitian) to make the best eating plan for you. Activity Get regular exercise as told by your provider. Return to your normal activities as told by your provider. Ask your provider what activities are safe for you. General instructions Take over-the-counter and prescription medicines only as told by your provider. Do not use any products that contain nicotine or tobacco. These products include cigarettes, chewing tobacco, and vaping devices, such as e-cigarettes. If you need help quitting, ask your provider. Contact a health care provider if: You are light-headed. You faint. Get help right away if: You have chest pain. You have trouble breathing. These symptoms may be an emergency. Get help right away. Call 911. Do not wait to see if the symptoms will go away. Do not drive yourself to the hospital. This information is not intended to replace advice given to you by your health care provider. Make sure you discuss any questions you have with your health care provider. Document Revised: 03/02/2022 Document Reviewed: 03/02/2022 Elsevier Patient Education  2024 [employer].

## 2023-02-23 NOTE — Telephone Encounter (Signed)
Pt called stating that he had missed his NP appt with Cardio today and needed to see if there was some way he could get in with another provider/office to get established prior to his surgery on 9.30.24. Pt stated that he was not notified about this appt and that is why he missed it. Asked pt if he could call around to other offices to see who could get him in sooner and to let us know where to reroute the referral. Pt refused stating "I can't make my own referral." Pt asked if someone could look into this for him so he can still have his surgery. Advised a message would be sent back to look into this.

## 2023-02-23 NOTE — Assessment & Plan Note (Signed)
Encourage heart healthy diet such as MIND or DASH diet, increase exercise, avoid trans fats, simple carbohydrates and processed foods, consider a krill or fish or flaxseed oil cap daily.  °

## 2023-02-23 NOTE — Assessment & Plan Note (Signed)
New Cardiology to see pt tomorrow Event monitor

## 2023-02-23 NOTE — Assessment & Plan Note (Signed)
hgba1c acceptable, minimize simple carbs. Increase exercise as tolerated. Continue current meds  Lab Results  Component Value Date   HGBA1C 6.0 11/30/2022

## 2023-02-25 ENCOUNTER — Encounter: Payer: Self-pay | Admitting: Cardiology

## 2023-02-25 ENCOUNTER — Ambulatory Visit: Payer: BC Managed Care – PPO | Attending: Cardiology | Admitting: Cardiology

## 2023-02-25 ENCOUNTER — Telehealth (HOSPITAL_COMMUNITY): Payer: Self-pay | Admitting: Radiology

## 2023-02-25 ENCOUNTER — Encounter (HOSPITAL_COMMUNITY): Payer: Self-pay

## 2023-02-25 VITALS — BP 112/60 | HR 69 | Ht 70.0 in | Wt 286.8 lb

## 2023-02-25 DIAGNOSIS — E785 Hyperlipidemia, unspecified: Secondary | ICD-10-CM

## 2023-02-25 DIAGNOSIS — Z01818 Encounter for other preprocedural examination: Secondary | ICD-10-CM

## 2023-02-25 DIAGNOSIS — Z01810 Encounter for preprocedural cardiovascular examination: Secondary | ICD-10-CM | POA: Insufficient documentation

## 2023-02-25 DIAGNOSIS — E1159 Type 2 diabetes mellitus with other circulatory complications: Secondary | ICD-10-CM | POA: Diagnosis not present

## 2023-02-25 DIAGNOSIS — I4510 Unspecified right bundle-branch block: Secondary | ICD-10-CM | POA: Diagnosis not present

## 2023-02-25 DIAGNOSIS — R011 Cardiac murmur, unspecified: Secondary | ICD-10-CM | POA: Insufficient documentation

## 2023-02-25 DIAGNOSIS — I152 Hypertension secondary to endocrine disorders: Secondary | ICD-10-CM

## 2023-02-25 DIAGNOSIS — Z0000 Encounter for general adult medical examination without abnormal findings: Secondary | ICD-10-CM | POA: Insufficient documentation

## 2023-02-25 DIAGNOSIS — Z7984 Long term (current) use of oral hypoglycemic drugs: Secondary | ICD-10-CM

## 2023-02-25 NOTE — Telephone Encounter (Signed)
Patient given detailed instructions per Myocardial Perfusion Study Information Sheet for the test on 9/19 at 8:15. Patient notified to arrive 15 minutes early and that it is imperative to arrive on time for appointment to keep from having the test rescheduled.  If you need to cancel or reschedule your appointment, please call the office within 24 hours of your appointment. . Patient verbalized understanding.ehk

## 2023-02-25 NOTE — Patient Instructions (Signed)
Medication Instructions:  Your physician recommends that you continue on your current medications as directed. Please refer to the Current Medication list given to you today.  *If you need a refill on your cardiac medications before your next appointment, please call your pharmacy*   Lab Work: None If you have labs (blood work) drawn today and your tests are completely normal, you will receive your results only by: MyChart Message (if you have MyChart) OR A paper copy in the mail If you have any lab test that is abnormal or we need to change your treatment, we will call you to review the results.   Testing/Procedures: Your physician has requested that you have an echocardiogram. Echocardiography is a painless test that uses sound waves to create images of your heart. It provides your doctor with information about the size and shape of your heart and how well your heart's chambers and valves are working. This procedure takes approximately one hour. There are no restrictions for this procedure. Please do NOT wear cologne, perfume, aftershave, or lotions (deodorant is allowed). Please arrive 15 minutes prior to your appointment time.     [facility] [address] [place], [place] [address] Phone:  [phone]    Please arrive 15 minutes prior to your appointment time for registration and insurance purposes.  The test will take approximately 3 to 4 hours to complete; you may bring reading material.  If someone comes with you to your appointment, they will need to remain in the main lobby due to limited space in the testing area. **If you are pregnant or breastfeeding, please notify the nuclear lab prior to your appointment**  How to prepare for your Myocardial Perfusion Test: Do not eat or drink 3 hours prior to your test, except you may have water. Do not consume products containing caffeine (regular or decaffeinated) 12 hours prior to your test. (ex:  coffee, chocolate, sodas, tea). Do bring a list of your current medications with you.  If not listed below, you may take your medications as normal. HOLD diabetic medication/insulin the morning of the test: Metformin Do wear comfortable clothes (no dresses or overalls) and walking shoes, tennis shoes preferred (No heels or open toe shoes are allowed). Do NOT wear cologne, perfume, aftershave, or lotions (deodorant is allowed). If these instructions are not followed, your test will have to be rescheduled.  Please report to [address] for your test.  If you have questions or concerns about your appointment, you can call the [facility] at [phone].  If you cannot keep your appointment, please provide 24 hours notification to the Nuclear Lab, to avoid a possible $50 charge to your account.    Follow-Up: At [facility], you and your health needs are our priority.  As part of our continuing mission to provide you with exceptional heart care, we have created designated Provider Care Teams.  These Care Teams include your primary Cardiologist (physician) and Advanced Practice Providers (APPs -  Physician Assistants and Nurse Practitioners) who all work together to provide you with the care you need, when you need it.  We recommend signing up for the patient portal called "MyChart".  Sign up information is provided on this After Visit Summary.  MyChart is used to connect with patients for Virtual Visits (Telemedicine).  Patients are able to view lab/test results, encounter notes, upcoming appointments, etc.  Non-urgent messages can be sent to your provider as well.  To learn more about what you can do with MyChart, go to [email or web address].    Your next appointment:   Follow up as needed  Provider:   Belva Crome, MD    Other Instructions None

## 2023-02-25 NOTE — Progress Notes (Signed)
Cardiology Office Note:    Date:  02/25/2023   ID:  Richard [name], DOB May 07, 1951, MRN [id number]  PCP:  Zola Button, Grayling Congress, DO  Cardiologist:  Garwin Brothers, MD   Referring MD: Zola Button, Grayling Congress, *    ASSESSMENT:    1. Pre-op evaluation   2. Preop cardiovascular exam   3. Hypertension associated with diabetes (HCC)   4. Right bundle branch block   5. Hyperlipidemia LDL goal <70   6. Morbid obesity (HCC)   7. Cardiac murmur    PLAN:    In order of problems listed above:  Primary prevention stressed with the patient.  Importance of compliance with diet medication stressed and he vocalized understanding Preop cardiovascular evaluation: Patient has multiple risk factors for coronary artery disease.  He leads a sedentary lifestyle.  Surgery is significantly patient is hide history and we will do a Lexiscan sestamibi to assess him for any ischemic substrate.  If the test is negative then he is not at high risk for coronary events during the aforementioned surgery.  Meticulous hemodynamic monitoring will further reduce the risk of coronary events. Essential hypertension: Blood pressure stable and diet was emphasized. Mixed dyslipidemia: On lipid-lowering medications followed by primary care. Diabetes melitis and morbid obesity: Weight reduction stressed diet emphasized.  Risks of obesity explained and he promises to do better.  Lifestyle modification urged. Cardiac murmur: Echocardiogram will be done to assess murmur heard on auscultation. Patient will be seen in follow-up appointment in 6 months or earlier if the patient has any concerns.    Medication Adjustments/Labs and Tests Ordered: Current medicines are reviewed at length with the patient today.  Concerns regarding medicines are outlined above.  Orders Placed This Encounter  Procedures   EKG 12-Lead   No orders of the defined types were placed in this encounter.    No chief complaint on file.    History of  Present Illness:    Richard [name] is a 72 y.o. male.  Patient has past medical history of essential hypertension, dyslipidemia, diabetes mellitus and morbid obesity.  He leads a sedentary lifestyle.  He is planning hip replacement surgery.  He mentions to me that he plans to get this at the end of the month.  He denies any chest pain orthopnea or PND.  No syncope.  At the time of my evaluation, the patient is alert awake oriented and in no distress.  Past Medical History:  Diagnosis Date   Allergy    seasonal   Arthritis     left knee,  right hip   Back pain    Bilateral swelling of feet    Complication of anesthesia    half awake during last colonscopy   Constipation    Diabetes mellitus    type 2   Hx of adenomatous colonic polyps    2010 6 mm adenoma 10/06/2016 2 5 mm descending polyps    Hyperlipidemia    Hypertension    Joint pain    Lactose intolerance     Past Surgical History:  Procedure Laterality Date   COLONOSCOPY     COLONOSCOPY WITH PROPOFOL N/A 10/06/2016   Procedure: COLONOSCOPY WITH PROPOFOL;  Surgeon: [name], MD;  Location: [facility];  Service: Endoscopy;  Laterality: N/A;    Current Medications: Current Meds  Medication Sig   Accu-Chek Softclix Lancets lancets Check blood sugars once daily   acetaminophen (TYLENOL) 500 MG tablet Take 500-1,000 mg by mouth every 6 (  six) hours as needed for mild pain or moderate pain (pain.).   atorvastatin (LIPITOR) 40 MG tablet TAKE 1 TABLET BY MOUTH EVERYDAY AT BEDTIME   Blood Glucose Monitoring Suppl (ACCU-CHEK GUIDE ME) w/Device KIT Check blood sugars once daily   dapagliflozin propanediol (FARXIGA) 5 MG TABS tablet Take 1 tablet (5 mg total) by mouth daily.   diltiazem (CARDIZEM CD) 180 MG 24 hr capsule Take 1 capsule (180 mg total) by mouth daily.   glucose blood (ACCU-CHEK GUIDE) test strip Check blood sugars once daily   ibuprofen (ADVIL) 200 MG tablet Take 600 mg by mouth 3 (three) times daily as needed  for mild pain or moderate pain (pain).   Insulin Pen Needle (B-D UF III MINI PEN NEEDLES) 31G X 5 MM MISC USE AS DIRECTED   lubiprostone (AMITIZA) 24 MCG capsule Take 1 capsule (24 mcg total) by mouth 2 (two) times daily with a meal.   metFORMIN (GLUCOPHAGE) 500 MG tablet TAKE 2 TABLETS (1,000 MG TOTAL) BY MOUTH 2 (TWO) TIMES DAILY WITH A MEAL.   olmesartan-hydrochlorothiazide (BENICAR HCT) 40-25 MG tablet Take 1 tablet by mouth daily.   oxyCODONE-acetaminophen (PERCOCET) 10-325 MG tablet Take 1 tablet by mouth every 8 (eight) hours as needed for pain.   tiZANidine (ZANAFLEX) 4 MG tablet Take 1 tablet (4 mg total) by mouth every 6 (six) hours as needed for muscle spasms.   triamcinolone cream (KENALOG) 0.1 % Apply 1 Application topically 2 (two) times daily.   Vitamin D, Ergocalciferol, (DRISDOL) 1.25 MG (50000 UNIT) CAPS capsule Take 1 capsule (50,000 Units total) by mouth every 7 (seven) days.     Allergies:   Semaglutide   Social History   Socioeconomic History   Marital status: Married    Spouse name: Not on file   Number of children: Not on file   Years of education: Not on file   Highest education level: Not on file  Occupational History   Not on file  Tobacco Use   Smoking status: Never   Smokeless tobacco: Never  Substance and Sexual Activity   Alcohol use: Yes    Comment: occasionally    Drug use: No   Sexual activity: Not on file  Other Topics Concern   Not on file  Social History Narrative   Not on file   Social Determinants of Health   Financial Resource Strain: Not on file  Food Insecurity: Not on file  Transportation Needs: Not on file  Physical Activity: Not on file  Stress: Not on file  Social Connections: Not on file     Family History: The patient's family history includes Diabetes in his brother, mother, and sister; Hyperlipidemia in his mother; Hypertension in his mother and sister; Stroke in his sister. There is no history of Colon cancer, Colon  polyps, Rectal cancer, Stomach cancer, or Esophageal cancer.  ROS:   Please see the history of present illness.    All other systems reviewed and are negative.  EKGs/Labs/Other Studies Reviewed:    The following studies were reviewed today: EKG reveals sinus rhythm right bundle branch block and nonspecific ST-T changes    Recent Labs: 11/30/2022: TSH 0.40 02/22/2023: ALT 10; BUN 33; Creatinine, Ser 1.23; Hemoglobin 13.0; Platelets 255.0; Potassium 4.5; Sodium 136  Recent Lipid Panel    Component Value Date/Time   CHOL 186 02/22/2023 1523   TRIG 138.0 02/22/2023 1523   HDL 75.80 02/22/2023 1523   CHOLHDL 2 02/22/2023 1523   VLDL 27.6 02/22/2023 1523  LDLCALC 83 02/22/2023 1523   LDLCALC 72 05/21/2022 1407   LDLDIRECT 133.0 12/05/2017 1205    Physical Exam:    VS:  BP 112/60 (BP Location: Left Arm, Patient Position: Sitting, Cuff Size: Normal)   Pulse 69   Ht 5\' 10"  (1.778 m)   Wt 286 lb 12.8 oz (130.1 kg)   SpO2 99%   BMI 41.15 kg/m     Wt Readings from Last 3 Encounters:  02/25/23 286 lb 12.8 oz (130.1 kg)  02/22/23 286 lb 9.6 oz (130 kg)  01/18/23 282 lb 6.4 oz (128.1 kg)     GEN: Patient is in no acute distress HEENT: Normal NECK: No JVD; No carotid bruits LYMPHATICS: No lymphadenopathy CARDIAC: Hear sounds regular, 2/6 systolic murmur at the apex. RESPIRATORY:  Clear to auscultation without rales, wheezing or rhonchi  ABDOMEN: Soft, non-tender, non-distended MUSCULOSKELETAL:  No edema; No deformity  SKIN: Warm and dry NEUROLOGIC:  Alert and oriented x 3 PSYCHIATRIC:  Normal affect   Signed, Garwin Brothers, MD  02/25/2023 9:03 AM    [facility]

## 2023-02-28 NOTE — Patient Instructions (Signed)
DUE TO COVID-19 ONLY TWO VISITORS  (aged 70 and older)  ARE ALLOWED TO COME WITH YOU AND STAY IN THE WAITING ROOM ONLY DURING PRE OP AND PROCEDURE.   **NO VISITORS ARE ALLOWED IN THE SHORT STAY AREA OR RECOVERY ROOM!!**  IF YOU WILL BE ADMITTED INTO THE HOSPITAL YOU ARE ALLOWED ONLY FOUR SUPPORT PEOPLE DURING VISITATION HOURS ONLY (7 AM -8PM)   The support person(s) must pass our screening, gel in and out, and wear a mask at all times, including in the patient's room. Patients must also wear a mask when staff or their support person are in the room. Visitors GUEST BADGE MUST BE WORN VISIBLY  One adult visitor may remain with you overnight and MUST be in the room by 8 P.M.     Your procedure is scheduled on: 03/14/23   Report to [facility] Main Entrance    Report to admitting at : 11:15 AM   Call this number if you have problems the morning of surgery [phone]   Do not eat food :After Midnight.   After Midnight you may have the following liquids until : 10:45 AM DAY OF SURGERY  Water Black Coffee (sugar ok, NO MILK/CREAM OR CREAMERS)  Tea (sugar ok, NO MILK/CREAM OR CREAMERS) regular and decaf                             Plain Jell-O (NO RED)                                           Fruit ices (not with fruit pulp, NO RED)                                     Popsicles (NO RED)                                                                  Juice: apple, WHITE grape, WHITE cranberry Sports drinks like Gatorade (NO RED)   The day of surgery:  Drink ONE (1) Pre-Surgery Clear G2 at : 10:45 AM the morning of surgery. Drink in one sitting. Do not sip.  This drink was given to you during your hospital  pre-op appointment visit. Nothing else to drink after completing the  Pre-Surgery Clear Ensure or G2.          If you have questions, please contact your surgeon's office.  FOLLOW ANY ADDITIONAL PRE OP INSTRUCTIONS YOU RECEIVED FROM YOUR SURGEON'S OFFICE!!!   Oral  Hygiene is also important to reduce your risk of infection.                                    Remember - BRUSH YOUR TEETH THE MORNING OF SURGERY WITH YOUR REGULAR TOOTHPASTE  DENTURES WILL BE REMOVED PRIOR TO SURGERY PLEASE DO NOT APPLY "Poly grip" OR ADHESIVES!!!   Do NOT smoke after Midnight   Take these medicines the morning of surgery  with A SIP OF WATER: diltiazem.Tylenol as needed.  How to Manage Your Diabetes Before and After Surgery  Why is it important to control my blood sugar before and after surgery? Improving blood sugar levels before and after surgery helps healing and can limit problems. A way of improving blood sugar control is eating a healthy diet by:  Eating less sugar and carbohydrates  Increasing activity/exercise  Talking with your doctor about reaching your blood sugar goals High blood sugars (greater than 180 mg/dL) can raise your risk of infections and slow your recovery, so you will need to focus on controlling your diabetes during the weeks before surgery. Make sure that the doctor who takes care of your diabetes knows about your planned surgery including the date and location.  How do I manage my blood sugar before surgery? Check your blood sugar at least 4 times a day, starting 2 days before surgery, to make sure that the level is not too high or low. Check your blood sugar the morning of your surgery when you wake up and every 2 hours until you get to the Short Stay unit. If your blood sugar is less than 70 mg/dL, you will need to treat for low blood sugar: Do not take insulin. Treat a low blood sugar (less than 70 mg/dL) with  cup of clear juice (cranberry or apple), 4 glucose tablets, OR glucose gel. Recheck blood sugar in 15 minutes after treatment (to make sure it is greater than 70 mg/dL). If your blood sugar is not greater than 70 mg/dL on recheck, call [phone] for further instructions. Report your blood sugar to the short stay nurse when you get  to Short Stay.  If you are admitted to the hospital after surgery: Your blood sugar will be checked by the staff and you will probably be given insulin after surgery (instead of oral diabetes medicines) to make sure you have good blood sugar levels. The goal for blood sugar control after surgery is 80-180 mg/dL.   WHAT DO I DO ABOUT MY DIABETES MEDICATION?  HOLD farxiga after 03/10/23     THE MORNING OF SURGERY, DO NOT TAKE ANY ORAL DIABETIC MEDICATIONS DAY OF YOUR SURGERY  DO NOT TAKE THE FOLLOWING 7 DAYS PRIOR TO SURGERY: Ozempic, Wegovy, Rybelsus (Semaglutide), Byetta (exenatide), Bydureon (exenatide ER), Victoza, Saxenda (liraglutide), or Trulicity (dulaglutide) Mounjaro (Tirzepatide) Adlyxin (Lixisenatide), Polyethylene Glycol Loxenatide.  Patient Signature:  Date:   Nurse Signature:  Date:   Reviewed and Endorsed by [facility] Patient Education Committee, August 2015  Bring CPAP mask and tubing day of surgery.                              You may not have any metal on your body including hair pins, jewelry, and body piercing             Do not wear make-up, lotions, powders, perfumes/cologne, or deodorant  Do not wear nail polish including gel and S&S, artificial/acrylic nails, or any other type of covering on natural nails including finger and toenails. If you have artificial nails, gel coating, etc. that needs to be removed by a nail salon please have this removed prior to surgery or surgery may need to be canceled/ delayed if the surgeon/ anesthesia feels like they are unable to be safely monitored.   Do not shave  48 hours prior to surgery.  Men may shave face and neck.   Do not bring valuables to the hospital. [facility] IS NOT             RESPONSIBLE   FOR VALUABLES.   Contacts, glasses, or bridgework may not be worn into surgery.   Bring small overnight bag day of surgery.   DO NOT BRING YOUR HOME MEDICATIONS TO THE HOSPITAL. PHARMACY WILL DISPENSE  MEDICATIONS LISTED ON YOUR MEDICATION LIST TO YOU DURING YOUR ADMISSION IN THE HOSPITAL!    Patients discharged on the day of surgery will not be allowed to drive home.  Someone NEEDS to stay with you for the first 24 hours after anesthesia.   Special Instructions: Bring a copy of your healthcare power of attorney and living will documents         the day of surgery if you haven't scanned them before.              Please read over the following fact sheets you were given: IF YOU HAVE QUESTIONS ABOUT YOUR PRE-OP INSTRUCTIONS PLEASE CALL [phone]      Pre-operative 5 CHG Bath Instructions   You can play a key role in reducing the risk of infection after surgery. Your skin needs to be as free of germs as possible. You can reduce the number of germs on your skin by washing with CHG (chlorhexidine gluconate) soap before surgery. CHG is an antiseptic soap that kills germs and continues to kill germs even after washing.   DO NOT use if you have an allergy to chlorhexidine/CHG or antibacterial soaps. If your skin becomes reddened or irritated, stop using the CHG and notify one of our RNs at : [phone].   Please shower with the CHG soap starting 4 days before surgery using the following schedule:     Please keep in mind the following:  DO NOT shave, including legs and underarms, starting the day of your first shower.   You may shave your face at any point before/day of surgery.  Place clean sheets on your bed the day you start using CHG soap. Use a clean washcloth (not used since being washed) for each shower. DO NOT sleep with pets once you start using the CHG.   CHG Shower Instructions:  If you choose to wash your hair and private area, wash first with your normal shampoo/soap.  After you use shampoo/soap, rinse your hair and body thoroughly to remove shampoo/soap residue.  Turn the water OFF and apply about 3 tablespoons (45 ml) of CHG soap to a CLEAN washcloth.  Apply CHG soap ONLY  FROM YOUR NECK DOWN TO YOUR TOES (washing for 3-5 minutes)  DO NOT use CHG soap on face, private areas, open wounds, or sores.  Pay special attention to the area where your surgery is being performed.  If you are having back surgery, having someone wash your back for you may be helpful. Wait 2 minutes after CHG soap is applied, then you may rinse off the CHG soap.  Pat dry with a clean towel  Put on clean clothes/pajamas   If you choose to wear lotion, please use ONLY the CHG-compatible lotions on the back of this paper.     Additional instructions for the day of surgery: DO NOT APPLY any lotions, deodorants, cologne, or perfumes.   Put on clean/comfortable clothes.  Brush your teeth.  Ask your nurse before applying any prescription medications to the skin.    CHG Compatible Lotions  Aveeno Moisturizing lotion  Cetaphil Moisturizing Cream  Cetaphil Moisturizing Lotion  Clairol Herbal Essence Moisturizing Lotion, Dry Skin  Clairol Herbal Essence Moisturizing Lotion, Extra Dry Skin  Clairol Herbal Essence Moisturizing Lotion, Normal Skin  Curel Age Defying Therapeutic Moisturizing Lotion with Alpha Hydroxy  Curel Extreme Care Body Lotion  Curel Soothing Hands Moisturizing Hand Lotion  Curel Therapeutic Moisturizing Cream, Fragrance-Free  Curel Therapeutic Moisturizing Lotion, Fragrance-Free  Curel Therapeutic Moisturizing Lotion, Original Formula  Eucerin Daily Replenishing Lotion  Eucerin Dry Skin Therapy Plus Alpha Hydroxy Crme  Eucerin Dry Skin Therapy Plus Alpha Hydroxy Lotion  Eucerin Original Crme  Eucerin Original Lotion  Eucerin Plus Crme Eucerin Plus Lotion  Eucerin TriLipid Replenishing Lotion  Keri Anti-Bacterial Hand Lotion  Keri Deep Conditioning Original Lotion Dry Skin Formula Softly Scented  Keri Deep Conditioning Original Lotion, Fragrance Free Sensitive Skin Formula  Keri Lotion Fast Absorbing Fragrance Free Sensitive Skin Formula  Keri Lotion Fast  Absorbing Softly Scented Dry Skin Formula  Keri Original Lotion  Keri Skin Renewal Lotion Keri Silky Smooth Lotion  Keri Silky Smooth Sensitive Skin Lotion  Nivea Body Creamy Conditioning Oil  Nivea Body Extra Enriched Lotion  Nivea Body Original Lotion  Nivea Body Sheer Moisturizing Lotion Nivea Crme  Nivea Skin Firming Lotion  NutraDerm 30 Skin Lotion  NutraDerm Skin Lotion  NutraDerm Therapeutic Skin Cream  NutraDerm Therapeutic Skin Lotion  ProShield Protective Hand Cream  Provon moisturizing lotion   Incentive Spirometer  An incentive spirometer is a tool that can help keep your lungs clear and active. This tool measures how well you are filling your lungs with each breath. Taking long deep breaths may help reverse or decrease the chance of developing breathing (pulmonary) problems (especially infection) following: A long period of time when you are unable to move or be active. BEFORE THE PROCEDURE  If the spirometer includes an indicator to show your best effort, your nurse or respiratory therapist will set it to a desired goal. If possible, sit up straight or lean slightly forward. Try not to slouch. Hold the incentive spirometer in an upright position. INSTRUCTIONS FOR USE  Sit on the edge of your bed if possible, or sit up as far as you can in bed or on a chair. Hold the incentive spirometer in an upright position. Breathe out normally. Place the mouthpiece in your mouth and seal your lips tightly around it. Breathe in slowly and as deeply as possible, raising the piston or the ball toward the top of the column. Hold your breath for 3-5 seconds or for as long as possible. Allow the piston or ball to fall to the bottom of the column. Remove the mouthpiece from your mouth and breathe out normally. Rest for a few seconds and repeat Steps 1 through 7 at least 10 times every 1-2 hours when you are awake. Take your time and take a few normal breaths between deep breaths. The  spirometer may include an indicator to show your best effort. Use the indicator as a goal to work toward during each repetition. After each set of 10 deep breaths, practice coughing to be sure your lungs are clear. If you have an incision (the cut made at the time of surgery), support your incision when coughing by placing a pillow or rolled up towels firmly against it. Once you are able to get out of bed, walk around indoors and cough well. You may stop using the incentive spirometer when instructed by your caregiver.  RISKS AND  COMPLICATIONS Take your time so you do not get dizzy or light-headed. If you are in pain, you may need to take or ask for pain medication before doing incentive spirometry. It is harder to take a deep breath if you are having pain. AFTER USE Rest and breathe slowly and easily. It can be helpful to keep track of a log of your progress. Your caregiver can provide you with a simple table to help with this. If you are using the spirometer at home, follow these instructions: SEEK MEDICAL CARE IF:  You are having difficultly using the spirometer. You have trouble using the spirometer as often as instructed. Your pain medication is not giving enough relief while using the spirometer. You develop fever of 100.5 F (38.1 C) or higher. SEEK IMMEDIATE MEDICAL CARE IF:  You cough up bloody sputum that had not been present before. You develop fever of 102 F (38.9 C) or greater. You develop worsening pain at or near the incision site. MAKE SURE YOU:  Understand these instructions. Will watch your condition. Will get help right away if you are not doing well or get worse. Document Released: 10/11/2006 Document Revised: 08/23/2011 Document Reviewed: 12/12/2006 [facility] Patient Information 2014 [place], [place].   ________________________________________________________________________

## 2023-03-01 ENCOUNTER — Encounter (HOSPITAL_COMMUNITY)
Admission: RE | Admit: 2023-03-01 | Discharge: 2023-03-01 | Disposition: A | Discharge status: Home / Self Care | Payer: BC Managed Care – PPO | Source: Ambulatory Visit | Attending: Orthopedic Surgery | Admitting: Orthopedic Surgery

## 2023-03-01 ENCOUNTER — Encounter (HOSPITAL_COMMUNITY): Payer: Self-pay

## 2023-03-01 ENCOUNTER — Ambulatory Visit (HOSPITAL_COMMUNITY): Payer: BC Managed Care – PPO | Attending: Cardiology

## 2023-03-01 ENCOUNTER — Other Ambulatory Visit: Payer: Self-pay

## 2023-03-01 VITALS — BP 169/69 | HR 73 | Temp 98.1°F | Ht 70.0 in | Wt 283.0 lb

## 2023-03-01 DIAGNOSIS — I152 Hypertension secondary to endocrine disorders: Secondary | ICD-10-CM | POA: Insufficient documentation

## 2023-03-01 DIAGNOSIS — Z01812 Encounter for preprocedural laboratory examination: Secondary | ICD-10-CM | POA: Diagnosis present

## 2023-03-01 DIAGNOSIS — Z01810 Encounter for preprocedural cardiovascular examination: Secondary | ICD-10-CM | POA: Insufficient documentation

## 2023-03-01 DIAGNOSIS — I4510 Unspecified right bundle-branch block: Secondary | ICD-10-CM | POA: Diagnosis not present

## 2023-03-01 DIAGNOSIS — E1159 Type 2 diabetes mellitus with other circulatory complications: Secondary | ICD-10-CM | POA: Diagnosis not present

## 2023-03-01 DIAGNOSIS — R011 Cardiac murmur, unspecified: Secondary | ICD-10-CM | POA: Insufficient documentation

## 2023-03-01 DIAGNOSIS — E785 Hyperlipidemia, unspecified: Secondary | ICD-10-CM | POA: Insufficient documentation

## 2023-03-01 DIAGNOSIS — Z01818 Encounter for other preprocedural examination: Secondary | ICD-10-CM

## 2023-03-01 DIAGNOSIS — E1165 Type 2 diabetes mellitus with hyperglycemia: Secondary | ICD-10-CM | POA: Insufficient documentation

## 2023-03-01 HISTORY — DX: Cardiac murmur, unspecified: R01.1

## 2023-03-01 LAB — BASIC METABOLIC PANEL
Anion gap: 11 (ref 5–15)
BUN: 22 mg/dL (ref 8–23)
CO2: 26 mmol/L (ref 22–32)
Calcium: 9.3 mg/dL (ref 8.9–10.3)
Chloride: 98 mmol/L (ref 98–111)
Creatinine, Ser: 0.94 mg/dL (ref 0.61–1.24)
GFR, Estimated: >60 mL/min (ref >60)
Glucose, Bld: 101 mg/dL — ABNORMAL HIGH (ref 70–99)
Potassium: 4.1 mmol/L (ref 3.5–5.1)
Sodium: 135 mmol/L (ref 135–145)

## 2023-03-01 LAB — CBC
HCT: 38.8 % — ABNORMAL LOW (ref 39.0–52.0)
Hemoglobin: 12.7 g/dL — ABNORMAL LOW (ref 13.0–17.0)
MCH: 32.9 pg (ref 26.0–34.0)
MCHC: 32.7 g/dL (ref 30.0–36.0)
MCV: 100.5 fL — ABNORMAL HIGH (ref 80.0–100.0)
Platelets: 259 10*3/uL (ref 150–400)
RBC: 3.86 MIL/uL — ABNORMAL LOW (ref 4.22–5.81)
RDW: 12.9 % (ref 11.5–15.5)
WBC: 6.2 10*3/uL (ref 4.0–10.5)
nRBC: 0 % (ref 0.0–0.2)

## 2023-03-01 LAB — TYPE AND SCREEN
ABO/RH(D): B POS
Antibody Screen: NEGATIVE

## 2023-03-01 LAB — ECHOCARDIOGRAM COMPLETE
Area-P 1/2: 4.53 cm2
Height: 70 in
S' Lateral: 2.7 cm
Weight: 4528 [oz_av]

## 2023-03-01 LAB — SURGICAL PCR SCREEN
MRSA, PCR: NEGATIVE
Staphylococcus aureus: NEGATIVE

## 2023-03-01 LAB — GLUCOSE, CAPILLARY: Glucose-Capillary: 105 mg/dL — ABNORMAL HIGH (ref 70–99)

## 2023-03-01 NOTE — Progress Notes (Signed)
For Short Stay: COVID SWAB appointment date:  Bowel Prep reminder:   For Anesthesia: PCP - [name], DO  Cardiologist - Revankar, Aundra Dubin, MD . Clearance: Pending.  Chest x-ray -  EKG - 02/22/23 Stress Test -  ECHO - 03/01/23 Cardiac Cath -  Pacemaker/ICD device last checked: Pacemaker orders received: Device Rep notified:  Spinal Cord Stimulator: N/A  Sleep Study - N/A CPAP -   Fasting Blood Sugar - 90's - 100's1 Checks Blood Sugar _____ times a day Date and result of last Hgb A1c- 02/22/23: 6.3  Last dose of GLP1 agonist- N/A GLP1 instructions:   Last dose of SGLT-2 inhibitors- Farxiga: last dose 03/10/23 SGLT-2 instructions:   Blood Thinner Instructions: N/A Aspirin Instructions: Last Dose:  Activity level: Can go up a flight of stairs and activities of daily living without stopping and without chest pain and/or shortness of breath   Able to exercise without chest pain and/or shortness of breath   Unable to go up a flight of stairs due to hip pain    Anesthesia review: Hx: HTN,DIA,Murmur,Right BBB.  Patient denies shortness of breath, fever, cough and chest pain at PAT appointment   Patient verbalized understanding of instructions that were given to them at the PAT appointment. Patient was also instructed that they will need to review over the PAT instructions again at home before surgery.

## 2023-03-02 ENCOUNTER — Ambulatory Visit (INDEPENDENT_AMBULATORY_CARE_PROVIDER_SITE_OTHER): Payer: BC Managed Care – PPO | Admitting: Family Medicine

## 2023-03-03 ENCOUNTER — Ambulatory Visit (HOSPITAL_COMMUNITY): Payer: BC Managed Care – PPO | Attending: Cardiology

## 2023-03-03 DIAGNOSIS — I4510 Unspecified right bundle-branch block: Secondary | ICD-10-CM | POA: Diagnosis present

## 2023-03-03 DIAGNOSIS — Z01818 Encounter for other preprocedural examination: Secondary | ICD-10-CM | POA: Insufficient documentation

## 2023-03-03 DIAGNOSIS — E785 Hyperlipidemia, unspecified: Secondary | ICD-10-CM | POA: Insufficient documentation

## 2023-03-03 DIAGNOSIS — E1159 Type 2 diabetes mellitus with other circulatory complications: Secondary | ICD-10-CM | POA: Insufficient documentation

## 2023-03-03 DIAGNOSIS — Z01810 Encounter for preprocedural cardiovascular examination: Secondary | ICD-10-CM | POA: Insufficient documentation

## 2023-03-03 DIAGNOSIS — I152 Hypertension secondary to endocrine disorders: Secondary | ICD-10-CM | POA: Diagnosis present

## 2023-03-03 DIAGNOSIS — R011 Cardiac murmur, unspecified: Secondary | ICD-10-CM | POA: Diagnosis present

## 2023-03-03 MED ORDER — TECHNETIUM TC 99M TETROFOSMIN IV KIT
32.5000 | PACK | Freq: Once | INTRAVENOUS | Status: AC | PRN
  Administered 2023-03-03: 32.5 via INTRAVENOUS

## 2023-03-03 MED ORDER — REGADENOSON 0.4 MG/5ML IV SOLN
0.4000 mg | Freq: Once | INTRAVENOUS | Status: AC
  Administered 2023-03-03: 0.4 mg via INTRAVENOUS

## 2023-03-04 ENCOUNTER — Ambulatory Visit (HOSPITAL_COMMUNITY): Payer: BC Managed Care – PPO

## 2023-03-04 LAB — MYOCARDIAL PERFUSION IMAGING
LV dias vol: 95 mL (ref 62–150)
LV sys vol: 29 mL
Nuc Stress EF: 69 %
Peak HR: 88 {beats}/min
Rest HR: 71 {beats}/min
Rest Nuclear Isotope Dose: 32.2 mCi
SDS: 0
SRS: 0
SSS: 0
ST Depression (mm): 0 mm
Stress Nuclear Isotope Dose: 32.5 mCi
TID: 1.05

## 2023-03-04 MED ORDER — TECHNETIUM TC 99M TETROFOSMIN IV KIT
32.2000 | PACK | Freq: Once | INTRAVENOUS | Status: AC | PRN
  Administered 2023-03-04: 32.2 via INTRAVENOUS

## 2023-03-08 ENCOUNTER — Encounter (HOSPITAL_COMMUNITY): Payer: Self-pay

## 2023-03-08 NOTE — Anesthesia Preprocedure Evaluation (Addendum)
Anesthesia Evaluation  Patient identified by MRN, date of birth, ID band Patient awake    Reviewed: Allergy & Precautions, NPO status , Patient's Chart, lab work & pertinent test results  Airway Mallampati: III  TM Distance: >3 FB Neck ROM: Full    Dental no notable dental hx. (+) Teeth Intact, Dental Advisory Given   Pulmonary neg pulmonary ROS   Pulmonary exam normal breath sounds clear to auscultation       Cardiovascular hypertension, Pt. on medications + Peripheral Vascular Disease  Normal cardiovascular exam Rhythm:Regular Rate:Normal  EKG RBBB  TTE 2024 1. Left ventricular ejection fraction, by estimation, is 60 to 65%. The  left ventricle has normal function. The left ventricle has no regional  wall motion abnormalities. There is mild concentric left ventricular  hypertrophy. Left ventricular diastolic  parameters are indeterminate.   2. Right ventricular systolic function is normal. The right ventricular  size is mildly enlarged.   3. The mitral valve is normal in structure. Trivial mitral valve  regurgitation. No evidence of mitral stenosis.   4. The aortic valve is normal in structure. Aortic valve regurgitation is  not visualized. No aortic stenosis is present.   5. The inferior vena cava is normal in size with greater than 50%  respiratory variability, suggesting right atrial pressure of 3 mmHg.   Stress Test 2024   LV perfusion is normal. There is no evidence of ischemia. There is no evidence of infarction. Diaphragm attenuation noted.   Left ventricular function is normal. Nuclear stress EF: 69%. The left ventricular ejection fraction is hyperdynamic (>65%). End diastolic cavity size is normal.   The study is normal. The study is low risk.     Neuro/Psych negative neurological ROS  negative psych ROS   GI/Hepatic negative GI ROS, Neg liver ROS,,,  Endo/Other  diabetes, Type 2, Oral Hypoglycemic  Agents  Morbid obesity (BMI 41)  Renal/GU negative Renal ROS  negative genitourinary   Musculoskeletal  (+) Arthritis ,    Abdominal   Peds  Hematology negative hematology ROS (+)   Anesthesia Other Findings   Reproductive/Obstetrics                             Anesthesia Physical Anesthesia Plan  ASA: 3  Anesthesia Plan: Spinal   Post-op Pain Management: Ofirmev IV (intra-op)*   Induction:   PONV Risk Score and Plan: 1 and Treatment may vary due to age or medical condition, Propofol infusion, Dexamethasone and Ondansetron  Airway Management Planned: Natural Airway  Additional Equipment:   Intra-op Plan:   Post-operative Plan:   Informed Consent: I have reviewed the patients History and Physical, chart, labs and discussed the procedure including the risks, benefits and alternatives for the proposed anesthesia with the patient or authorized representative who has indicated his/her understanding and acceptance.     Dental advisory given  Plan Discussed with: CRNA  Anesthesia Plan Comments: (See PAT note from 9/17 by Sherlie Ban PA-C )        Anesthesia Quick Evaluation

## 2023-03-08 NOTE — Progress Notes (Signed)
Anesthesia review:   DISCUSSION: Richard [name] is a 72 year old male who presents to PAT prior to right THA on 03/14/2023 with Dr. Lequita Halt.  Past medical history of HTN, HLD, heart murmur, arthritis.  Prior complication from anesthesia includes intraoperative awareness during a colonoscopy.  Patient was referred to cardiology for preop risk stratification and clearance.  He saw [name] on 02/25/2023 who recommended a stress test and also an echocardiogram due to heart murmur heard on exam.  Echo showed trivial MR and stress test was low risk.  Primary prevention was recommended and he was advised to follow-up in 6 months  VS: BP (!) 169/69   Pulse 73   Temp 36.7 C (Oral)   Ht 5\' 10"  (1.778 m)   Wt 128.4 kg   SpO2 99%   BMI 40.61 kg/m   PROVIDERS: [name], DO   LABS: Labs reviewed: Acceptable for surgery. (all labs ordered are listed, but only abnormal results are displayed)  Labs Reviewed  BASIC METABOLIC PANEL - Abnormal; Notable for the following components:      Result Value   Glucose, Bld 101 (*)    All other components within normal limits  CBC - Abnormal; Notable for the following components:   RBC 3.86 (*)    Hemoglobin 12.7 (*)    HCT 38.8 (*)    MCV 100.5 (*)    All other components within normal limits  GLUCOSE, CAPILLARY - Abnormal; Notable for the following components:   Glucose-Capillary 105 (*)    All other components within normal limits  SURGICAL PCR SCREEN  TYPE AND SCREEN     EKG 02/25/23  normal sinus rhythm, rate 69 RBBB.   CV:  Echo 03/01/23:  IMPRESSIONS     1. Left ventricular ejection fraction, by estimation, is 60 to 65%. The  left ventricle has normal function. The left ventricle has no regional  wall motion abnormalities. There is mild concentric left ventricular  hypertrophy. Left ventricular diastolic  parameters are indeterminate.   2. Right ventricular systolic function is normal. The right ventricular   size is mildly enlarged.   3. The mitral valve is normal in structure. Trivial mitral valve  regurgitation. No evidence of mitral stenosis.   4. The aortic valve is normal in structure. Aortic valve regurgitation is  not visualized. No aortic stenosis is present.   5. The inferior vena cava is normal in size with greater than 50%  respiratory variability, suggesting right atrial pressure of 3 mmHg.   Stress test 03/04/23:    LV perfusion is normal. There is no evidence of ischemia. There is no evidence of infarction. Diaphragm attenuation noted.   Left ventricular function is normal. Nuclear stress EF: 69%. The left ventricular ejection fraction is hyperdynamic (>65%). End diastolic cavity size is normal.   The study is normal. The study is low risk.  Past Medical History:  Diagnosis Date   Allergy    seasonal   Arthritis     left knee,  right hip   Back pain    Bilateral swelling of feet    Complication of anesthesia    half awake during last colonscopy   Constipation    Diabetes mellitus    type 2   Heart murmur    Hx of adenomatous colonic polyps    2010 6 mm adenoma 10/06/2016 2 5 mm descending polyps    Hyperlipidemia    Hypertension    Joint pain    Lactose intolerance  Past Surgical History:  Procedure Laterality Date   COLONOSCOPY     COLONOSCOPY WITH PROPOFOL N/A 10/06/2016   Procedure: COLONOSCOPY WITH PROPOFOL;  Surgeon: [name], MD;  Location: [facility];  Service: Endoscopy;  Laterality: N/A;    MEDICATIONS:  Accu-Chek Softclix Lancets lancets   acetaminophen (TYLENOL) 500 MG tablet   atorvastatin (LIPITOR) 40 MG tablet   Blood Glucose Monitoring Suppl (ACCU-CHEK GUIDE ME) w/Device KIT   dapagliflozin propanediol (FARXIGA) 5 MG TABS tablet   diltiazem (CARDIZEM CD) 180 MG 24 hr capsule   glucose blood (ACCU-CHEK GUIDE) test strip   ibuprofen (ADVIL) 200 MG tablet   Insulin Pen Needle (B-D UF III MINI PEN NEEDLES) 31G X 5 MM MISC    lubiprostone (AMITIZA) 24 MCG capsule   metFORMIN (GLUCOPHAGE) 500 MG tablet   olmesartan-hydrochlorothiazide (BENICAR HCT) 40-25 MG tablet   oxyCODONE-acetaminophen (PERCOCET) 10-325 MG tablet   tiZANidine (ZANAFLEX) 4 MG tablet   triamcinolone cream (KENALOG) 0.1 %   Vitamin D, Ergocalciferol, (DRISDOL) 1.25 MG (50000 UNIT) CAPS capsule   No current facility-administered medications for this encounter.   Marcille Blanco [facility] Short Stay/Anesthesiology [facility] Phone [phone] 03/08/2023 8:50 AM

## 2023-03-14 ENCOUNTER — Encounter (HOSPITAL_COMMUNITY): Payer: Self-pay | Admitting: Orthopedic Surgery

## 2023-03-14 ENCOUNTER — Ambulatory Visit (HOSPITAL_COMMUNITY): Payer: BC Managed Care – PPO | Admitting: Medical

## 2023-03-14 ENCOUNTER — Other Ambulatory Visit: Payer: Self-pay

## 2023-03-14 ENCOUNTER — Observation Stay (HOSPITAL_COMMUNITY)
Admission: RE | Admit: 2023-03-14 | Discharge: 2023-03-15 | Disposition: A | Discharge status: Home / Self Care | Payer: BC Managed Care – PPO | Attending: Orthopedic Surgery | Admitting: Orthopedic Surgery

## 2023-03-14 ENCOUNTER — Ambulatory Visit (HOSPITAL_COMMUNITY): Payer: BC Managed Care – PPO | Admitting: Anesthesiology

## 2023-03-14 ENCOUNTER — Ambulatory Visit (HOSPITAL_COMMUNITY): Payer: BC Managed Care – PPO

## 2023-03-14 ENCOUNTER — Encounter (HOSPITAL_COMMUNITY)
Admission: RE | Disposition: A | Discharge status: Home / Self Care | Payer: Self-pay | Source: Home / Self Care | Attending: Orthopedic Surgery

## 2023-03-14 DIAGNOSIS — M1611 Unilateral primary osteoarthritis, right hip: Principal | ICD-10-CM | POA: Insufficient documentation

## 2023-03-14 DIAGNOSIS — E119 Type 2 diabetes mellitus without complications: Secondary | ICD-10-CM | POA: Diagnosis not present

## 2023-03-14 DIAGNOSIS — I10 Essential (primary) hypertension: Secondary | ICD-10-CM | POA: Insufficient documentation

## 2023-03-14 DIAGNOSIS — Z794 Long term (current) use of insulin: Secondary | ICD-10-CM | POA: Insufficient documentation

## 2023-03-14 DIAGNOSIS — Z79899 Other long term (current) drug therapy: Secondary | ICD-10-CM | POA: Insufficient documentation

## 2023-03-14 DIAGNOSIS — Z7984 Long term (current) use of oral hypoglycemic drugs: Secondary | ICD-10-CM | POA: Diagnosis not present

## 2023-03-14 DIAGNOSIS — M169 Osteoarthritis of hip, unspecified: Principal | ICD-10-CM | POA: Diagnosis present

## 2023-03-14 HISTORY — PX: TOTAL HIP ARTHROPLASTY: SHX124

## 2023-03-14 LAB — ABO/RH: ABO/RH(D): B POS

## 2023-03-14 LAB — GLUCOSE, CAPILLARY
Glucose-Capillary: 117 mg/dL — ABNORMAL HIGH (ref 70–99)
Glucose-Capillary: 102 mg/dL — ABNORMAL HIGH (ref 70–99)
Glucose-Capillary: 211 mg/dL — ABNORMAL HIGH (ref 70–99)

## 2023-03-14 SURGERY — ARTHROPLASTY, HIP, TOTAL, ANTERIOR APPROACH
Anesthesia: Spinal | Site: Hip | Laterality: Right

## 2023-03-14 MED ORDER — ONDANSETRON HCL 4 MG PO TABS: 4.0000 mg | ORAL_TABLET | Freq: Four times a day (QID) | ORAL | Status: DC | PRN

## 2023-03-14 MED ORDER — POVIDONE-IODINE 10 % EX SWAB: 2.0000 | Freq: Once | CUTANEOUS | Status: DC

## 2023-03-14 MED ORDER — FENTANYL CITRATE PF 50 MCG/ML IJ SOSY
25.0000 ug | PREFILLED_SYRINGE | INTRAMUSCULAR | Status: DC | PRN
  Administered 2023-03-14: 50 ug via INTRAVENOUS

## 2023-03-14 MED ORDER — 0.9 % SODIUM CHLORIDE (POUR BTL) OPTIME
TOPICAL | Status: DC | PRN
  Administered 2023-03-14: 1000 mL

## 2023-03-14 MED ORDER — LIDOCAINE 2% (20 MG/ML) 5 ML SYRINGE
INTRAMUSCULAR | Status: DC | PRN
  Administered 2023-03-14: 25 mg via INTRAVENOUS

## 2023-03-14 MED ORDER — ONDANSETRON HCL 4 MG/2ML IJ SOLN: 4.0000 mg | Freq: Four times a day (QID) | INTRAMUSCULAR | Status: DC | PRN

## 2023-03-14 MED ORDER — ORAL CARE MOUTH RINSE: 15.0000 mL | Freq: Once | OROMUCOSAL | Status: AC

## 2023-03-14 MED ORDER — DOCUSATE SODIUM 100 MG PO CAPS
100.0000 mg | ORAL_CAPSULE | Freq: Two times a day (BID) | ORAL | Status: DC
  Administered 2023-03-14 – 2023-03-15 (×2): 100 mg via ORAL
  Filled 2023-03-14 (×2): qty 1

## 2023-03-14 MED ORDER — FENTANYL CITRATE (PF) 100 MCG/2ML IJ SOLN
INTRAMUSCULAR | Status: DC | PRN
Start: 2023-03-14 — End: 2023-03-14
  Administered 2023-03-14 (×4): 25 ug via INTRAVENOUS

## 2023-03-14 MED ORDER — TIZANIDINE HCL 4 MG PO TABS
4.0000 mg | ORAL_TABLET | Freq: Four times a day (QID) | ORAL | Status: DC | PRN
  Administered 2023-03-15 (×2): 4 mg via ORAL
  Filled 2023-03-14 (×2): qty 1

## 2023-03-14 MED ORDER — MIDAZOLAM HCL 2 MG/2ML IJ SOLN
INTRAMUSCULAR | Status: AC
  Filled 2023-03-14: qty 2

## 2023-03-14 MED ORDER — METOCLOPRAMIDE HCL 5 MG/ML IJ SOLN: 5.0000 mg | Freq: Three times a day (TID) | INTRAMUSCULAR | Status: DC | PRN

## 2023-03-14 MED ORDER — WATER FOR IRRIGATION, STERILE IR SOLN
Status: DC | PRN
  Administered 2023-03-14: 2000 mL

## 2023-03-14 MED ORDER — CEFAZOLIN SODIUM-DEXTROSE 2-3 GM-%(50ML) IV SOLR
INTRAVENOUS | Status: DC | PRN
Start: 2023-03-14 — End: 2023-03-14
  Administered 2023-03-14: 2 g via INTRAVENOUS

## 2023-03-14 MED ORDER — LUBIPROSTONE 24 MCG PO CAPS
24.0000 ug | ORAL_CAPSULE | Freq: Two times a day (BID) | ORAL | Status: DC
  Administered 2023-03-15: 24 ug via ORAL
  Filled 2023-03-14 (×3): qty 1

## 2023-03-14 MED ORDER — BUPIVACAINE-EPINEPHRINE (PF) 0.25% -1:200000 IJ SOLN
INTRAMUSCULAR | Status: DC | PRN
  Administered 2023-03-14: 30 mL via PERINEURAL

## 2023-03-14 MED ORDER — BISACODYL 10 MG RE SUPP: 10.0000 mg | Freq: Every day | RECTAL | Status: DC | PRN

## 2023-03-14 MED ORDER — HYDROCHLOROTHIAZIDE 25 MG PO TABS
25.0000 mg | ORAL_TABLET | Freq: Every day | ORAL | Status: DC
  Administered 2023-03-15: 25 mg via ORAL
  Filled 2023-03-14: qty 1

## 2023-03-14 MED ORDER — DEXAMETHASONE SODIUM PHOSPHATE 10 MG/ML IJ SOLN
8.0000 mg | Freq: Once | INTRAMUSCULAR | Status: AC
  Administered 2023-03-14: 10 mg via INTRAVENOUS

## 2023-03-14 MED ORDER — PROPOFOL 10 MG/ML IV BOLUS
INTRAVENOUS | Status: DC | PRN
  Administered 2023-03-14 (×2): 50 mg via INTRAVENOUS
  Administered 2023-03-14: 20 mg via INTRAVENOUS
  Administered 2023-03-14: 30 mg via INTRAVENOUS
  Administered 2023-03-14 (×2): 50 mg via INTRAVENOUS

## 2023-03-14 MED ORDER — PHENOL 1.4 % MT LIQD: 1.0000 | OROMUCOSAL | Status: DC | PRN

## 2023-03-14 MED ORDER — PROPOFOL 500 MG/50ML IV EMUL
INTRAVENOUS | Status: AC
  Filled 2023-03-14: qty 50

## 2023-03-14 MED ORDER — HYDROMORPHONE HCL 1 MG/ML IJ SOLN
0.5000 mg | INTRAMUSCULAR | Status: DC | PRN
  Administered 2023-03-14 – 2023-03-15 (×2): 1 mg via INTRAVENOUS
  Administered 2023-03-15 (×2): 0.5 mg via INTRAVENOUS
  Filled 2023-03-14 (×4): qty 1

## 2023-03-14 MED ORDER — CEFAZOLIN IN SODIUM CHLORIDE 3-0.9 GM/100ML-% IV SOLN
3.0000 g | INTRAVENOUS | Status: DC
  Filled 2023-03-14: qty 100

## 2023-03-14 MED ORDER — MIDAZOLAM HCL 5 MG/5ML IJ SOLN
INTRAMUSCULAR | Status: DC | PRN
Start: 2023-03-14 — End: 2023-03-14
  Administered 2023-03-14 (×4): .5 mg via INTRAVENOUS

## 2023-03-14 MED ORDER — IRBESARTAN 150 MG PO TABS
300.0000 mg | ORAL_TABLET | Freq: Every day | ORAL | Status: DC
  Administered 2023-03-15: 300 mg via ORAL
  Filled 2023-03-14: qty 2

## 2023-03-14 MED ORDER — TRANEXAMIC ACID-NACL 1000-0.7 MG/100ML-% IV SOLN
1000.0000 mg | INTRAVENOUS | Status: AC
  Administered 2023-03-14: 1000 mg via INTRAVENOUS
  Filled 2023-03-14: qty 100

## 2023-03-14 MED ORDER — ACETAMINOPHEN 10 MG/ML IV SOLN
1000.0000 mg | Freq: Four times a day (QID) | INTRAVENOUS | Status: DC
  Administered 2023-03-14: 1000 mg via INTRAVENOUS
  Filled 2023-03-14: qty 100

## 2023-03-14 MED ORDER — FENTANYL CITRATE PF 50 MCG/ML IJ SOSY
PREFILLED_SYRINGE | INTRAMUSCULAR | Status: AC
  Filled 2023-03-14: qty 1

## 2023-03-14 MED ORDER — FENTANYL CITRATE (PF) 100 MCG/2ML IJ SOLN
INTRAMUSCULAR | Status: AC
  Filled 2023-03-14: qty 2

## 2023-03-14 MED ORDER — LACTATED RINGERS IV SOLN: INTRAVENOUS | Status: DC

## 2023-03-14 MED ORDER — CEFAZOLIN SODIUM-DEXTROSE 2-4 GM/100ML-% IV SOLN
2.0000 g | Freq: Four times a day (QID) | INTRAVENOUS | Status: AC
  Administered 2023-03-15: 2 g via INTRAVENOUS
  Filled 2023-03-14 (×3): qty 100

## 2023-03-14 MED ORDER — LIDOCAINE HCL (PF) 2 % IJ SOLN
INTRAMUSCULAR | Status: AC
  Filled 2023-03-14: qty 5

## 2023-03-14 MED ORDER — SODIUM CHLORIDE 0.9 % IV SOLN: INTRAVENOUS | Status: DC

## 2023-03-14 MED ORDER — TRAMADOL HCL 50 MG PO TABS
50.0000 mg | ORAL_TABLET | Freq: Four times a day (QID) | ORAL | Status: DC | PRN
  Administered 2023-03-15: 100 mg via ORAL
  Administered 2023-03-15: 50 mg via ORAL
  Filled 2023-03-14: qty 2
  Filled 2023-03-14: qty 1

## 2023-03-14 MED ORDER — ONDANSETRON HCL 4 MG/2ML IJ SOLN
INTRAMUSCULAR | Status: DC | PRN
Start: 2023-03-14 — End: 2023-03-14
  Administered 2023-03-14: 4 mg via INTRAVENOUS

## 2023-03-14 MED ORDER — DEXAMETHASONE SODIUM PHOSPHATE 10 MG/ML IJ SOLN
INTRAMUSCULAR | Status: AC
  Filled 2023-03-14: qty 1

## 2023-03-14 MED ORDER — PROPOFOL 500 MG/50ML IV EMUL
INTRAVENOUS | Status: DC | PRN
  Administered 2023-03-14: 75 ug/kg/min via INTRAVENOUS

## 2023-03-14 MED ORDER — BUPIVACAINE-EPINEPHRINE 0.25% -1:200000 IJ SOLN
INTRAMUSCULAR | Status: AC
  Filled 2023-03-14: qty 1

## 2023-03-14 MED ORDER — CHLORHEXIDINE GLUCONATE 0.12 % MT SOLN
15.0000 mL | Freq: Once | OROMUCOSAL | Status: AC
  Administered 2023-03-14: 15 mL via OROMUCOSAL

## 2023-03-14 MED ORDER — ASPIRIN 81 MG PO CHEW
81.0000 mg | CHEWABLE_TABLET | Freq: Two times a day (BID) | ORAL | Status: DC
  Administered 2023-03-14 – 2023-03-15 (×2): 81 mg via ORAL
  Filled 2023-03-14 (×2): qty 1

## 2023-03-14 MED ORDER — PROPOFOL 10 MG/ML IV BOLUS
INTRAVENOUS | Status: AC
  Filled 2023-03-14: qty 20

## 2023-03-14 MED ORDER — OXYCODONE HCL 5 MG PO TABS
5.0000 mg | ORAL_TABLET | Freq: Three times a day (TID) | ORAL | Status: DC | PRN
  Administered 2023-03-14 – 2023-03-15 (×3): 5 mg via ORAL
  Filled 2023-03-14 (×4): qty 1

## 2023-03-14 MED ORDER — ACETAMINOPHEN 325 MG PO TABS: 325.0000 mg | ORAL_TABLET | Freq: Four times a day (QID) | ORAL | Status: DC | PRN

## 2023-03-14 MED ORDER — MAGNESIUM CITRATE PO SOLN: 1.0000 | Freq: Once | ORAL | Status: DC | PRN

## 2023-03-14 MED ORDER — BUPIVACAINE IN DEXTROSE 0.75-8.25 % IT SOLN
INTRATHECAL | Status: DC | PRN
Start: 2023-03-14 — End: 2023-03-14
  Administered 2023-03-14: 1.8 mL via INTRATHECAL

## 2023-03-14 MED ORDER — DILTIAZEM HCL ER COATED BEADS 180 MG PO CP24
180.0000 mg | ORAL_CAPSULE | Freq: Every day | ORAL | Status: DC
  Administered 2023-03-15: 180 mg via ORAL
  Filled 2023-03-14: qty 1

## 2023-03-14 MED ORDER — MENTHOL 3 MG MT LOZG: 1.0000 | LOZENGE | OROMUCOSAL | Status: DC | PRN

## 2023-03-14 MED ORDER — OLMESARTAN MEDOXOMIL-HCTZ 40-25 MG PO TABS: 1.0000 | ORAL_TABLET | Freq: Every day | ORAL | Status: DC

## 2023-03-14 MED ORDER — POLYETHYLENE GLYCOL 3350 17 G PO PACK: 17.0000 g | PACK | Freq: Every day | ORAL | Status: DC | PRN

## 2023-03-14 MED ORDER — OXYCODONE-ACETAMINOPHEN 10-325 MG PO TABS: 1.0000 | ORAL_TABLET | Freq: Three times a day (TID) | ORAL | Status: DC | PRN

## 2023-03-14 MED ORDER — METOCLOPRAMIDE HCL 5 MG PO TABS: 5.0000 mg | ORAL_TABLET | Freq: Three times a day (TID) | ORAL | Status: DC | PRN

## 2023-03-14 MED ORDER — OXYCODONE-ACETAMINOPHEN 5-325 MG PO TABS
1.0000 | ORAL_TABLET | Freq: Three times a day (TID) | ORAL | Status: DC | PRN
  Administered 2023-03-14 – 2023-03-15 (×3): 1 via ORAL
  Filled 2023-03-14 (×4): qty 1

## 2023-03-14 MED ORDER — ATORVASTATIN CALCIUM 40 MG PO TABS
40.0000 mg | ORAL_TABLET | Freq: Every day | ORAL | Status: DC
  Administered 2023-03-14: 40 mg via ORAL
  Filled 2023-03-14: qty 1

## 2023-03-14 MED ORDER — INSULIN ASPART 100 UNIT/ML IJ SOLN
0.0000 [IU] | Freq: Three times a day (TID) | INTRAMUSCULAR | Status: DC
  Administered 2023-03-15 (×2): 2 [IU] via SUBCUTANEOUS

## 2023-03-14 SURGICAL SUPPLY — 40 items
ADH SKN CLS APL DERMABOND .7 (GAUZE/BANDAGES/DRESSINGS) ×1
BAG COUNTER SPONGE SURGICOUNT (BAG) IMPLANT
BAG SPEC THK2 15X12 ZIP CLS (MISCELLANEOUS)
BAG SPNG CNTER NS LX DISP (BAG)
BAG ZIPLOCK 12X15 (MISCELLANEOUS) IMPLANT
BLADE SAG 18X100X1.27 (BLADE) ×1 IMPLANT
COVER PERINEAL POST (MISCELLANEOUS) ×1 IMPLANT
COVER SURGICAL LIGHT HANDLE (MISCELLANEOUS) ×1 IMPLANT
CUP ACETBLR 54 OD PINNACLE (Hips) IMPLANT
DERMABOND ADVANCED .7 DNX12 (GAUZE/BANDAGES/DRESSINGS) ×1 IMPLANT
DRAPE FOOT SWITCH (DRAPES) ×1 IMPLANT
DRAPE STERI IOBAN 125X83 (DRAPES) ×1 IMPLANT
DRAPE U-SHAPE 47X51 STRL (DRAPES) ×2 IMPLANT
DRSG AQUACEL AG ADV 3.5X10 (GAUZE/BANDAGES/DRESSINGS) ×1 IMPLANT
DURAPREP 26ML APPLICATOR (WOUND CARE) ×1 IMPLANT
ELECT REM PT RETURN 15FT ADLT (MISCELLANEOUS) ×1 IMPLANT
GLOVE BIO SURGEON STRL SZ 6.5 (GLOVE) IMPLANT
GLOVE BIO SURGEON STRL SZ8 (GLOVE) ×1 IMPLANT
GLOVE BIOGEL PI IND STRL 6.5 (GLOVE) IMPLANT
GLOVE BIOGEL PI IND STRL 7.0 (GLOVE) IMPLANT
GLOVE BIOGEL PI IND STRL 8 (GLOVE) ×1 IMPLANT
GOWN STRL REUS W/ TWL LRG LVL3 (GOWN DISPOSABLE) ×1 IMPLANT
GOWN STRL REUS W/TWL LRG LVL3 (GOWN DISPOSABLE) ×1
HEAD CERAMIC DELTA 36 PLUS 1.5 (Hips) IMPLANT
HOLDER FOLEY CATH W/STRAP (MISCELLANEOUS) ×1 IMPLANT
KIT TURNOVER KIT A (KITS) IMPLANT
LINER MARATHON NEUT +4X54X36 (Hips) IMPLANT
MANIFOLD NEPTUNE II (INSTRUMENTS) ×1 IMPLANT
PACK ANTERIOR HIP CUSTOM (KITS) ×1 IMPLANT
PENCIL SMOKE EVACUATOR COATED (MISCELLANEOUS) ×1 IMPLANT
SPIKE FLUID TRANSFER (MISCELLANEOUS) ×1 IMPLANT
STEM FEM ACTIS STD SZ2 (Stem) IMPLANT
SUT ETHIBOND NAB CT1 #1 30IN (SUTURE) ×1 IMPLANT
SUT MNCRL AB 4-0 PS2 18 (SUTURE) ×1 IMPLANT
SUT STRATAFIX 0 PDS 27 VIOLET (SUTURE) ×1
SUT VIC AB 2-0 CT1 27 (SUTURE) ×2
SUT VIC AB 2-0 CT1 TAPERPNT 27 (SUTURE) ×2 IMPLANT
SUTURE STRATFX 0 PDS 27 VIOLET (SUTURE) ×1 IMPLANT
TRAY FOLEY MTR SLVR 16FR STAT (SET/KITS/TRAYS/PACK) ×1 IMPLANT
TUBE SUCTION HIGH CAP CLEAR NV (SUCTIONS) ×1 IMPLANT

## 2023-03-14 NOTE — Transfer of Care (Signed)
Immediate Anesthesia Transfer of Care Note  Patient: Richard [name]  Procedure(s) Performed: TOTAL HIP ARTHROPLASTY ANTERIOR APPROACH (Right: Hip)  Patient Location: PACU  Anesthesia Type:Spinal  Level of Consciousness: awake, alert , and patient cooperative  Airway & Oxygen Therapy: Patient Spontanous Breathing and Patient connected to face mask oxygen  Post-op Assessment: Report given to RN, Post -op Vital signs reviewed and stable, and Patient moving all extremities  Post vital signs: Reviewed and stable  Last Vitals:  Vitals Value Taken Time  BP    Temp    Pulse    Resp    SpO2      Last Pain:  Vitals:   03/14/23 1021  TempSrc: Oral  PainSc:          Complications: No notable events documented.

## 2023-03-14 NOTE — Anesthesia Procedure Notes (Signed)
Spinal  Patient location during procedure: OR Start time: 03/14/2023 12:40 PM End time: 03/14/2023 12:50 PM Reason for block: surgical anesthesia Staffing Performed: anesthesiologist  Anesthesiologist: Elmer Picker, MD Performed by: Elmer Picker, MD Authorized by: Elmer Picker, MD   Preanesthetic Checklist Completed: patient identified, IV checked, risks and benefits discussed, surgical consent, monitors and equipment checked, pre-op evaluation and timeout performed Spinal Block Patient position: sitting Prep: DuraPrep and site prepped and draped Patient monitoring: cardiac monitor, continuous pulse ox and blood pressure Approach: midline Location: L4-5 Injection technique: single-shot Needle Needle type: Quincke  Needle gauge: 22 G Needle length: 9 cm Assessment Sensory level: T6 Events: CSF return Additional Notes Functioning IV was confirmed and monitors were applied. Sterile prep and drape, including hand hygiene and sterile gloves were used. The patient was positioned and the spine was prepped. The skin was anesthetized with lidocaine.  Free flow of clear CSF was obtained prior to injecting local anesthetic into the CSF.  The spinal needle aspirated freely following injection.  The needle was carefully withdrawn.  The patient tolerated the procedure well.   Difficult spinal placement. Attempts at L2/3, L3/4, and L4/5. Successful spinal at L4/5 with 22G Quincke.

## 2023-03-14 NOTE — Op Note (Signed)
OPERATIVE REPORT- TOTAL HIP ARTHROPLASTY   PREOPERATIVE DIAGNOSIS: Osteoarthritis of the Right hip.   POSTOPERATIVE DIAGNOSIS: Osteoarthritis of the Right  hip.   PROCEDURE: Right total hip arthroplasty, anterior approach.   SURGEON: [name], MD   ASSISTANT: Arcola Jansky, PA-C  ANESTHESIA:  Spinal  ESTIMATED BLOOD LOSS:-400 mL    DRAINS: None  COMPLICATIONS: None   CONDITION: PACU - hemodynamically stable.   BRIEF CLINICAL NOTE: Richard [name] is a 72 y.o. male who has advanced end-  stage arthritis of their Right  hip with progressively worsening pain and  dysfunction.The patient has failed nonoperative management and presents for  total hip arthroplasty.   PROCEDURE IN DETAIL: After successful administration of spinal  anesthetic, the traction boots for the [facility] bed were placed on both  feet and the patient was placed onto the [facility] bed, boots placed into the leg  holders. The Right hip was then isolated from the perineum with plastic  drapes and prepped and draped in the usual sterile fashion. ASIS and  greater trochanter were marked and a oblique incision was made, starting  at about 1 cm lateral and 2 cm distal to the ASIS and coursing towards  the anterior cortex of the femur. The skin was cut with a 10 blade  through subcutaneous tissue to the level of the fascia overlying the  tensor fascia lata muscle. The fascia was then incised in line with the  incision at the junction of the anterior third and posterior 2/3rd. The  muscle was teased off the fascia and then the interval between the TFL  and the rectus was developed. The Hohmann retractor was then placed at  the top of the femoral neck over the capsule. The vessels overlying the  capsule were cauterized and the fat on top of the capsule was removed.  A Hohmann retractor was then placed anterior underneath the rectus  femoris to give exposure to the entire anterior capsule. A T-shaped   capsulotomy was performed. The edges were tagged and the femoral head  was identified.       Osteophytes are removed off the superior acetabulum.  The femoral neck was then cut in situ with an oscillating saw. Traction  was then applied to the left lower extremity utilizing the [facility]  traction. The femoral head was then removed. Retractors were placed  around the acetabulum and then circumferential removal of the labrum was  performed. Osteophytes were also removed. Reaming starts at 47 mm to  medialize and  Increased in 2 mm increments to 53 mm. We reamed in  approximately 40 degrees of abduction, 20 degrees anteversion. A 54 mm  pinnacle acetabular shell was then impacted in anatomic position under  fluoroscopic guidance with excellent purchase. We did not need to place  any additional dome screws. A 36 mm neutral + 4 marathon liner was then  placed into the acetabular shell.       The femoral lift was then placed along the lateral aspect of the femur  just distal to the vastus ridge. The leg was  externally rotated and capsule  was stripped off the inferior aspect of the femoral neck down to the  level of the lesser trochanter, this was done with electrocautery. The femur was lifted after this was performed. The  leg was then placed in an extended and adducted position essentially delivering the femur. We also removed the capsule superiorly and the piriformis from the piriformis fossa to gain  excellent exposure of the  proximal femur. Rongeur was used to remove some cancellous bone to get  into the lateral portion of the proximal femur for placement of the  initial starter reamer. The starter broaches was placed  the starter broach  and was shown to go down the center of the canal. Broaching  with the Actis system was then performed starting at size 0  coursing  Up to size 2. A size 2 had excellent torsional and rotational  and axial stability. The trial standard offset neck was then  placed  with a 36 + 1.5 trial head. The hip was then reduced. We confirmed that  the stem was in the canal both on AP and lateral x-rays. It also has excellent sizing. The hip was reduced with outstanding stability through full extension and full external rotation.. AP pelvis was taken and the leg lengths were measured and found to be equal. Hip was then dislocated again and the femoral head and neck removed. The  femoral broach was removed. Size 2 Actis stem with a standard offset  neck was then impacted into the femur following native anteversion. Has  excellent purchase in the canal. Excellent torsional and rotational and  axial stability. It is confirmed to be in the canal on AP and lateral  fluoroscopic views. The 36 + 1.5 ceramic head was placed and the hip  reduced with outstanding stability. Again AP pelvis was taken and it  confirmed that the leg lengths were equal. The wound was then copiously  irrigated with saline solution and the capsule reattached and repaired  with Ethibond suture. 30 ml of .25% Bupivicaine was  injected into the capsule and into the edge of the tensor fascia lata as well as subcutaneous tissue. The fascia overlying the tensor fascia lata was then closed with a running #1 V-Loc. Subcu was closed with interrupted 2-0 Vicryl and subcuticular running 4-0 Monocryl. Incision was cleaned  and dried. Steri-Strips and a bulky sterile dressing applied. The patient was awakened and transported to  recovery in stable condition.        Please note that a surgical assistant was a medical necessity for this procedure to perform it in a safe and expeditious manner. Assistant was necessary to provide appropriate retraction of vital neurovascular structures and to prevent femoral fracture and allow for anatomic placement of the prosthesis.  [name], M.D.

## 2023-03-14 NOTE — Discharge Instructions (Signed)
Richard Gross, MD Total Joint Specialist [facility] [address] [place], [place] [address] [phone]  ANTERIOR APPROACH TOTAL HIP REPLACEMENT POSTOPERATIVE DIRECTIONS     Hip Rehabilitation, Guidelines Following Surgery  The results of a hip operation are greatly improved after range of motion and muscle strengthening exercises. Follow all safety measures which are given to protect your hip. If any of these exercises cause increased pain or swelling in your joint, decrease the amount until you are comfortable again. Then slowly increase the exercises. Call your caregiver if you have problems or questions.   BLOOD CLOT PREVENTION Take 81 mg Aspirin two times a day for three weeks following surgery. Then take an 81 mg Aspirin once a day for three weeks. Then discontinue Aspirin. You may resume your vitamins/supplements upon discharge from the hospital. Do not take any NSAIDs (Advil, Aleve, Ibuprofen, Meloxicam, etc.) for 3 weeks, while taking 81mg  Aspirin twice a day.   HOME CARE INSTRUCTIONS  Remove items at home which could result in a fall. This includes throw rugs or furniture in walking pathways.  ICE to the affected hip as frequently as 20-30 minutes an hour and then as needed for pain and swelling. Continue to use ice on the hip for pain and swelling from surgery. You may notice swelling that will progress down to the foot and ankle. This is normal after surgery. Elevate the leg when you are not up walking on it.   Continue to use the breathing machine which will help keep your temperature down.  It is common for your temperature to cycle up and down following surgery, especially at night when you are not up moving around and exerting yourself.  The breathing machine keeps your lungs expanded and your temperature down.  DIET You may resume your previous home diet once your are discharged from the hospital.  DRESSING / WOUND CARE / SHOWERING You  have an adhesive waterproof bandage over the incision. Leave this in place until your first follow-up appointment. Once you remove this you will not need to place another bandage.  You may begin showering 3 days following surgery, but do not submerge the incision under water.  ACTIVITY For the first 3-5 days, it is important to rest and keep the operative leg elevated. You should, as a general rule, rest for 50 minutes and walk/stretch for 10 minutes per hour. After 5 days, you may slowly increase activity as tolerated.  Perform the exercises you were provided twice a day for about 15-20 minutes each session. Begin these 2 days following surgery. Walk with your walker as instructed. Use the walker until you are comfortable transitioning to a cane. Walk with the cane in the opposite hand of the operative leg. You may discontinue the cane once you are comfortable and walking steadily. Avoid periods of inactivity such as sitting longer than an hour when not asleep. This helps prevent blood clots.  Do not drive a car for 6 weeks or until released by your surgeon.  Do not drive while taking narcotics.  TED HOSE STOCKINGS Wear the elastic stockings on both legs for three weeks following surgery during the day. You may remove them at night while sleeping.  WEIGHT BEARING Weight bearing as tolerated with assist device (walker, cane, etc) as directed, use it as long as suggested by your surgeon or therapist, typically at least 4-6 weeks.  POSTOPERATIVE CONSTIPATION PROTOCOL Constipation - defined medically as fewer than three stools per week and severe  constipation as less than one stool per week.  One of the most common issues patients have following surgery is constipation.  Even if you have a regular bowel pattern at home, your normal regimen is likely to be disrupted due to multiple reasons following surgery.  Combination of anesthesia, postoperative narcotics, change in appetite and fluid intake all  can affect your bowels.  In order to avoid complications following surgery, here are some recommendations in order to help you during your recovery period.  Colace (docusate) - Pick up an over-the-counter form of Colace or another stool softener and take twice a day as long as you are requiring postoperative pain medications.  Take with a full glass of water daily.  If you experience loose stools or diarrhea, hold the colace until you stool forms back up.  If your symptoms do not get better within 1 week or if they get worse, check with your doctor. Dulcolax (bisacodyl) - Pick up over-the-counter and take as directed by the product packaging as needed to assist with the movement of your bowels.  Take with a full glass of water.  Use this product as needed if not relieved by Colace only.  MiraLax (polyethylene glycol) - Pick up over-the-counter to have on hand.  MiraLax is a solution that will increase the amount of water in your bowels to assist with bowel movements.  Take as directed and can mix with a glass of water, juice, soda, coffee, or tea.  Take if you go more than two days without a movement.Do not use MiraLax more than once per day. Call your doctor if you are still constipated or irregular after using this medication for 7 days in a row.  If you continue to have problems with postoperative constipation, please contact the office for further assistance and recommendations.  If you experience "the worst abdominal pain ever" or develop nausea or vomiting, please contact the office immediatly for further recommendations for treatment.  ITCHING  If you experience itching with your medications, try taking only a single pain pill, or even half a pain pill at a time.  You can also use Benadryl over the counter for itching or also to help with sleep.   MEDICATIONS See your medication summary on the "After Visit Summary" that the nursing staff will review with you prior to discharge.  You may have some  home medications which will be placed on hold until you complete the course of blood thinner medication.  It is important for you to complete the blood thinner medication as prescribed by your surgeon.  Continue your approved medications as instructed at time of discharge.  PRECAUTIONS If you experience chest pain or shortness of breath - call 911 immediately for transfer to the hospital emergency department.  If you develop a fever greater that 101 F, purulent drainage from wound, increased redness or drainage from wound, foul odor from the wound/dressing, or calf pain - CONTACT YOUR SURGEON.                                                   FOLLOW-UP APPOINTMENTS Make sure you keep all of your appointments after your operation with your surgeon and caregivers. You should call the office at the above phone number and make an appointment for approximately two weeks after the date of your surgery or  on the date instructed by your surgeon outlined in the "After Visit Summary".  RANGE OF MOTION AND STRENGTHENING EXERCISES  These exercises are designed to help you keep full movement of your hip joint. Follow your caregiver's or physical therapist's instructions. Perform all exercises about fifteen times, three times per day or as directed. Exercise both hips, even if you have had only one joint replacement. These exercises can be done on a training (exercise) mat, on the floor, on a table or on a bed. Use whatever works the best and is most comfortable for you. Use music or television while you are exercising so that the exercises are a pleasant break in your day. This will make your life better with the exercises acting as a break in routine you can look forward to.  Lying on your back, slowly slide your foot toward your buttocks, raising your knee up off the floor. Then slowly slide your foot back down until your leg is straight again.  Lying on your back spread your legs as far apart as you can without  causing discomfort.  Lying on your side, raise your upper leg and foot straight up from the floor as far as is comfortable. Slowly lower the leg and repeat.  Lying on your back, tighten up the muscle in the front of your thigh (quadriceps muscles). You can do this by keeping your leg straight and trying to raise your heel off the floor. This helps strengthen the largest muscle supporting your knee.  Lying on your back, tighten up the muscles of your buttocks both with the legs straight and with the knee bent at a comfortable angle while keeping your heel on the floor.   POST-OPERATIVE OPIOID TAPER INSTRUCTIONS: It is important to wean off of your opioid medication as soon as possible. If you do not need pain medication after your surgery it is ok to stop day one. Opioids include: Codeine, Hydrocodone(Norco, Vicodin), Oxycodone(Percocet, oxycontin) and hydromorphone amongst others.  Long term and even short term use of opiods can cause: Increased pain response Dependence Constipation Depression Respiratory depression And more.  Withdrawal symptoms can include Flu like symptoms Nausea, vomiting And more Techniques to manage these symptoms Hydrate well Eat regular healthy meals Stay active Use relaxation techniques(deep breathing, meditating, yoga) Do Not substitute Alcohol to help with tapering If you have been on opioids for less than two weeks and do not have pain than it is ok to stop all together.  Plan to wean off of opioids This plan should start within one week post op of your joint replacement. Maintain the same interval or time between taking each dose and first decrease the dose.  Cut the total daily intake of opioids by one tablet each day Next start to increase the time between doses. The last dose that should be eliminated is the evening dose.   IF YOU ARE TRANSFERRED TO A SKILLED REHAB FACILITY If the patient is transferred to a skilled rehab facility following release  from the hospital, a list of the current medications will be sent to the facility for the patient to continue.  When discharged from the skilled rehab facility, please have the facility set up the patient's Home Health Physical Therapy prior to being released. Also, the skilled facility will be responsible for providing the patient with their medications at time of release from the facility to include their pain medication, the muscle relaxants, and their blood thinner medication. If the patient is still at the  rehab facility at time of the two week follow up appointment, the skilled rehab facility will also need to assist the patient in arranging follow up appointment in our office and any transportation needs.  MAKE SURE YOU:  Understand these instructions.  Get help right away if you are not doing well or get worse.    DENTAL ANTIBIOTICS:  In most cases prophylactic antibiotics for Dental procdeures after total joint surgery are not necessary.  Exceptions are as follows:  1. History of prior total joint infection  2. Severely immunocompromised (Organ Transplant, cancer chemotherapy, Rheumatoid biologic meds such as Humera)  3. Poorly controlled diabetes (A1C &gt; 8.0, blood glucose over 200)  If you have one of these conditions, contact your surgeon for an antibiotic prescription, prior to your dental procedure.    Pick up stool softner and laxative for home use following surgery while on pain medications. Do not submerge incision under water. Please use good hand washing techniques while changing dressing each day. May shower starting three days after surgery. Please use a clean towel to pat the incision dry following showers. Continue to use ice for pain and swelling after surgery. Do not use any lotions or creams on the incision until instructed by your surgeon.

## 2023-03-14 NOTE — Interval H&P Note (Signed)
History and Physical Interval Note:  03/14/2023 10:36 AM  Richard [name]  has presented today for surgery, with the diagnosis of right hip osteoarthritis.  The various methods of treatment have been discussed with the patient and family. After consideration of risks, benefits and other options for treatment, the patient has consented to  Procedure(s): TOTAL HIP ARTHROPLASTY ANTERIOR APPROACH (Right) as a surgical intervention.  The patient's history has been reviewed, patient examined, no change in status, stable for surgery.  I have reviewed the patient's chart and labs.  Questions were answered to the patient's satisfaction.     Homero Fellers Meloney Feld

## 2023-03-14 NOTE — Anesthesia Postprocedure Evaluation (Signed)
Anesthesia Post Note  Patient: Breccan Galant  Procedure(s) Performed: TOTAL HIP ARTHROPLASTY ANTERIOR APPROACH (Right: Hip)     Patient location during evaluation: PACU Anesthesia Type: Spinal Level of consciousness: oriented and awake and alert Pain management: pain level controlled Vital Signs Assessment: post-procedure vital signs reviewed and stable Respiratory status: spontaneous breathing, respiratory function stable and patient connected to nasal cannula oxygen Cardiovascular status: blood pressure returned to baseline and stable Postop Assessment: no headache, no backache and no apparent nausea or vomiting Anesthetic complications: no  No notable events documented.  Last Vitals:  Vitals:   03/14/23 1600 03/14/23 1615  BP: (!) 173/81 (!) 179/81  Pulse: (!) 58 63  Resp: 12 18  Temp:    SpO2: 100% 100%    Last Pain:  Vitals:   03/14/23 1615  TempSrc:   PainSc: 4     LLE Motor Response: No movement due to regional block (03/14/23 1615) LLE Sensation: Numbness;Tingling (03/14/23 1615) RLE Motor Response: No movement due to regional block (03/14/23 1615) RLE Sensation: Numbness;Tingling (03/14/23 1615) L Sensory Level: L3-Anterior knee, lower leg (03/14/23 1615) R Sensory Level: L3-Anterior knee, lower leg (03/14/23 1615)  Karolee Meloni L Jaren Kearn

## 2023-03-15 ENCOUNTER — Other Ambulatory Visit (HOSPITAL_COMMUNITY): Payer: Self-pay

## 2023-03-15 ENCOUNTER — Encounter (HOSPITAL_COMMUNITY): Payer: Self-pay | Admitting: Orthopedic Surgery

## 2023-03-15 DIAGNOSIS — M1611 Unilateral primary osteoarthritis, right hip: Secondary | ICD-10-CM | POA: Diagnosis not present

## 2023-03-15 LAB — CBC
HCT: 36.4 % — ABNORMAL LOW (ref 39.0–52.0)
Hemoglobin: 12.1 g/dL — ABNORMAL LOW (ref 13.0–17.0)
MCH: 33.6 pg (ref 26.0–34.0)
MCHC: 33.2 g/dL (ref 30.0–36.0)
MCV: 101.1 fL — ABNORMAL HIGH (ref 80.0–100.0)
Platelets: 279 10*3/uL (ref 150–400)
RBC: 3.6 MIL/uL — ABNORMAL LOW (ref 4.22–5.81)
RDW: 12.1 % (ref 11.5–15.5)
WBC: 15.5 10*3/uL — ABNORMAL HIGH (ref 4.0–10.5)
nRBC: 0 % (ref 0.0–0.2)

## 2023-03-15 LAB — BASIC METABOLIC PANEL
Anion gap: 9 (ref 5–15)
BUN: 18 mg/dL (ref 8–23)
CO2: 25 mmol/L (ref 22–32)
Calcium: 8.5 mg/dL — ABNORMAL LOW (ref 8.9–10.3)
Chloride: 97 mmol/L — ABNORMAL LOW (ref 98–111)
Creatinine, Ser: 0.75 mg/dL (ref 0.61–1.24)
GFR, Estimated: >60 mL/min (ref >60)
Glucose, Bld: 153 mg/dL — ABNORMAL HIGH (ref 70–99)
Potassium: 4 mmol/L (ref 3.5–5.1)
Sodium: 131 mmol/L — ABNORMAL LOW (ref 135–145)

## 2023-03-15 LAB — GLUCOSE, CAPILLARY
Glucose-Capillary: 137 mg/dL — ABNORMAL HIGH (ref 70–99)
Glucose-Capillary: 138 mg/dL — ABNORMAL HIGH (ref 70–99)

## 2023-03-15 MED ORDER — CEFAZOLIN SODIUM-DEXTROSE 2-4 GM/100ML-% IV SOLN
2.0000 g | Freq: Once | INTRAVENOUS | Status: AC
  Administered 2023-03-15: 2 g via INTRAVENOUS
  Filled 2023-03-15: qty 100

## 2023-03-15 MED ORDER — ONDANSETRON HCL 4 MG PO TABS
4.0000 mg | ORAL_TABLET | Freq: Four times a day (QID) | ORAL | 0 refills | Status: AC | PRN
  Filled 2023-03-15: qty 18, 21d supply, fill #0

## 2023-03-15 MED ORDER — TRAMADOL HCL 50 MG PO TABS
50.0000 mg | ORAL_TABLET | Freq: Four times a day (QID) | ORAL | 0 refills | Status: AC | PRN
Start: 2023-03-15 — End: ?
  Filled 2023-03-15: qty 40, 7d supply, fill #0

## 2023-03-15 MED ORDER — ASPIRIN 81 MG PO CHEW
81.0000 mg | CHEWABLE_TABLET | Freq: Two times a day (BID) | ORAL | 0 refills | Status: AC
  Filled 2023-03-15: qty 63, 32d supply, fill #0

## 2023-03-15 NOTE — Evaluation (Signed)
Physical Therapy Evaluation Patient Details Name: Richard [name] MRN: [id number] DOB: 1950-12-23 Today's Date: 03/15/2023  History of Present Illness  72 yo male s/p R DA THA on 03/14/23. PMH: HTN, DM, morbid obesity  Clinical Impression  Pt is s/p THA resulting in the deficits listed below (see PT Problem List).  Pt amb ~ 69' with RW and CGA for safety. Incr assist needed for bed mobility however pt endorses sleeping in recliner at home. Will see again this pm and pt should be ready to d/c later today   Pt will benefit from acute skilled PT to increase their independence and safety with mobility to facilitate discharge.          If plan is discharge home, recommend the following: A little help with walking and/or transfers;A little help with bathing/dressing/bathroom;Assistance with cooking/housework;Assist for transportation;Help with stairs or ramp for entrance   Can travel by private vehicle        Equipment Recommendations None recommended by PT  Recommendations for Other Services       Functional Status Assessment Patient has had a recent decline in their functional status and demonstrates the ability to make significant improvements in function in a reasonable and predictable amount of time.     Precautions / Restrictions Precautions Precautions: Fall Restrictions Weight Bearing Restrictions: No Other Position/Activity Restrictions: WBAT      Mobility  Bed Mobility Overal bed mobility: Needs Assistance Bed Mobility: Supine to Sit     Supine to sit: Min assist, Used rails     General bed mobility comments: cues for technique and sequence, incr time and effort. pt sleeps in recliner at home    Transfers Overall transfer level: Needs assistance Equipment used: Rolling walker (2 wheels) Transfers: Sit to/from Stand Sit to Stand: Min assist, From elevated surface           General transfer comment: cues for hand placement; uncontrolled descent to chair     Ambulation/Gait Ambulation/Gait assistance: Contact guard assist Gait Distance (Feet): 60 Feet Assistive device: Rolling walker (2 wheels) Gait Pattern/deviations: Step-to pattern       General Gait Details: cues for sequence and RW position from self  Stairs            Wheelchair Mobility     Tilt Bed    Modified Rankin (Stroke Patients Only)       Balance Overall balance assessment: Mild deficits observed, not formally tested                                           Pertinent Vitals/Pain Pain Assessment Pain Assessment: 0-10 Pain Score: 4  Pain Location: right hip Pain Descriptors / Indicators: Grimacing, Guarding, Sore Pain Intervention(s): Limited activity within patient's tolerance, Monitored during session, Premedicated before session, Repositioned    Home Living Family/patient expects to be discharged to:: Private residence Living Arrangements: Spouse/significant other Available Help at Discharge: Family Type of Home: House Home Access: Level entry       Home Layout: One level Home Equipment: Agricultural consultant (2 wheels);Cane - single point;Toilet riser;Other (comment) (armrests by toilet)      Prior Function Prior Level of Function : Independent/Modified Independent             Mobility Comments: amb with cane prior to surgery       Extremity/Trunk Assessment   Upper Extremity Assessment Upper Extremity  Assessment: Overall WFL for tasks assessed    Lower Extremity Assessment Lower Extremity Assessment: RLE deficits/detail;LLE deficits/detail RLE Deficits / Details: ankle WFL knee and hip 2+/5 ltd by post op pain LLE Deficits / Details: ankle WFL; hip flexion to 25 degrees in supine, strength grossly 3+/5 at hip, knee grossly 4/5       Communication      Cognition Arousal: Alert Behavior During Therapy: WFL for tasks assessed/performed Overall Cognitive Status: Within Functional Limits for tasks assessed                                           General Comments      Exercises     Assessment/Plan    PT Assessment Patient needs continued PT services  PT Problem List Decreased strength;Decreased activity tolerance;Decreased range of motion;Decreased mobility;Decreased knowledge of precautions;Pain;Decreased knowledge of use of DME       PT Treatment Interventions DME instruction;Gait training;Functional mobility training;Therapeutic activities;Therapeutic exercise;Patient/family education    PT Goals (Current goals can be found in the Care Plan section)  Acute Rehab PT Goals PT Goal Formulation: With patient Time For Goal Achievement: 03/22/23 Potential to Achieve Goals: Good    Frequency 7X/week     Co-evaluation               AM-PAC PT "6 Clicks" Mobility  Outcome Measure Help needed turning from your back to your side while in a flat bed without using bedrails?: A Lot Help needed moving from lying on your back to sitting on the side of a flat bed without using bedrails?: A Lot Help needed moving to and from a bed to a chair (including a wheelchair)?: A Little Help needed standing up from a chair using your arms (e.g., wheelchair or bedside chair)?: A Little Help needed to walk in hospital room?: A Little Help needed climbing 3-5 steps with a railing? : A Lot 6 Click Score: 15    End of Session Equipment Utilized During Treatment: Gait belt Activity Tolerance: Patient tolerated treatment well Patient left: in chair;with call bell/phone within reach;with chair alarm set;with nursing/sitter in room   PT Visit Diagnosis: Other abnormalities of gait and mobility (R26.89);Difficulty in walking, not elsewhere classified (R26.2)    Time: 1031-1105 PT Time Calculation (min) (ACUTE ONLY): 34 min   Charges:   PT Evaluation $PT Eval Low Complexity: 1 Low PT Treatments $Gait Training: 8-22 mins PT General Charges $$ ACUTE PT VISIT: 1 Visit          Almer Bushey, PT  Acute Rehab Dept [facility]) [phone]  03/15/2023   [facility] 03/15/2023, 11:16 AM

## 2023-03-15 NOTE — Progress Notes (Signed)
Physical Therapy Treatment Patient Details Name: Richard [name] MRN: [id number] DOB: 1950-08-24 Today's Date: 03/15/2023   History of Present Illness 72 yo male s/p R DA THA on 03/14/23. PMH: HTN, DM, morbid obesity    PT Comments  Pt is progressing well, meeting goals; wife present for session. Reviewed HEP and progression. Questions answered regarding mobility/HEP.  Pt is ready for d/c with family assist as needed.   If plan is discharge home, recommend the following: A little help with walking and/or transfers;A little help with bathing/dressing/bathroom;Assistance with cooking/housework;Assist for transportation;Help with stairs or ramp for entrance   Can travel by private vehicle        Equipment Recommendations  None recommended by PT    Recommendations for Other Services       Precautions / Restrictions Precautions Precautions: Fall Restrictions Weight Bearing Restrictions: No Other Position/Activity Restrictions: WBAT     Mobility  Bed Mobility Overal bed mobility: Needs Assistance Bed Mobility: Supine to Sit     Supine to sit: Min assist, Used rails     General bed mobility comments: in recliner    Transfers Overall transfer level: Needs assistance Equipment used: Rolling walker (2 wheels) Transfers: Sit to/from Stand Sit to Stand: Contact guard assist           General transfer comment: cues for hand placement;  and to control descent to chair; pt reports only sitting on higher surfaces at home    Ambulation/Gait Ambulation/Gait assistance: Supervision Gait Distance (Feet): 130 Feet Assistive device: Rolling walker (2 wheels) Gait Pattern/deviations: Step-to pattern, Decreased stance time - right       General Gait Details: cues for sequence and RW position from self, beginning step through with good stability. improving wt shift to RLE   Stairs             Wheelchair Mobility     Tilt Bed    Modified Rankin (Stroke Patients  Only)       Balance Overall balance assessment: Mild deficits observed, not formally tested                                          Cognition Arousal: Alert Behavior During Therapy: WFL for tasks assessed/performed Overall Cognitive Status: Within Functional Limits for tasks assessed                                          Exercises Total Joint Exercises Ankle Circles/Pumps: AROM, Both, 10 reps Quad Sets: AROM, Both, 10 reps Heel Slides: AROM, 10 reps, Right Hip ABduction/ADduction: AROM, Right, 10 reps    General Comments        Pertinent Vitals/Pain Pain Assessment Pain Assessment: 0-10 Pain Score: 5  Pain Location: right hip Pain Descriptors / Indicators: Grimacing, Guarding, Sore Pain Intervention(s): Limited activity within patient's tolerance, Monitored during session, Premedicated before session    Home Living Family/patient expects to be discharged to:: Private residence Living Arrangements: Spouse/significant other                      Prior Function            PT Goals (current goals can now be found in the care plan section) Acute Rehab PT Goals PT Goal Formulation: With patient Time  For Goal Achievement: 03/22/23 Potential to Achieve Goals: Good Progress towards PT goals: Progressing toward goals    Frequency    7X/week      PT Plan      Co-evaluation              AM-PAC PT "6 Clicks" Mobility   Outcome Measure  Help needed turning from your back to your side while in a flat bed without using bedrails?: A Lot Help needed moving from lying on your back to sitting on the side of a flat bed without using bedrails?: A Lot Help needed moving to and from a bed to a chair (including a wheelchair)?: A Little Help needed standing up from a chair using your arms (e.g., wheelchair or bedside chair)?: A Little Help needed to walk in hospital room?: A Little Help needed climbing 3-5 steps with a  railing? : A Lot 6 Click Score: 15    End of Session Equipment Utilized During Treatment: Gait belt Activity Tolerance: Patient tolerated treatment well Patient left: in chair;with call bell/phone within reach;with family/visitor present;with chair alarm set   PT Visit Diagnosis: Other abnormalities of gait and mobility (R26.89);Difficulty in walking, not elsewhere classified (R26.2)     Time: 4098-1191 PT Time Calculation (min) (ACUTE ONLY): 36 min  Charges:    $Gait Training: 8-22 mins $Therapeutic Exercise: 8-22 mins PT General Charges $$ ACUTE PT VISIT: 1 Visit                     Million Maharaj, PT  Acute Rehab Dept [facility]) [phone]  03/15/2023    [facility] 03/15/2023, 3:20 PM

## 2023-03-15 NOTE — TOC Transition Note (Signed)
Transition of Care [facility]) - CM/SW Discharge Note   Patient Details  Name: Richard [name] MRN: [id number] Date of Birth: 1950/07/24  Transition of Care [facility]) CM/SW Contact:  Amada Jupiter, LCSW Phone Number: 03/15/2023, 9:49 AM   Clinical Narrative:     Met with pt who confirms he has needed DME in the home.  Plan for HEP.  No TOC needs.  Final next level of care: Home/Self Care Barriers to Discharge: No Barriers Identified   Patient Goals and CMS Choice      Discharge Placement                         Discharge Plan and Services Additional resources added to the After Visit Summary for                  DME Arranged: N/A DME Agency: NA                  Social Determinants of Health (SDOH) Interventions SDOH Screenings   Depression (PHQ2-9): Low Risk  (11/30/2022)  Tobacco Use: Low Risk  (03/14/2023)     Readmission Risk Interventions     No data to display

## 2023-03-15 NOTE — Progress Notes (Signed)
   Subjective: 1 Day Post-Op Procedure(s) (LRB): TOTAL HIP ARTHROPLASTY ANTERIOR APPROACH (Right) Patient reports pain as moderate.   Patient seen in rounds by Dr. Lequita Halt. Patient is doing fair this AM. Denies chest pain or SOB. No issues overnight. Voiding without difficulty We will begin therapy today  Objective: Vital signs in last 24 hours: Temp:  [97.5 F (36.4 C)-98.3 F (36.8 C)] 97.7 F (36.5 C) (10/01 0603) Pulse Rate:  [58-89] 63 (10/01 0603) Resp:  [12-20] 19 (10/01 0603) BP: (135-179)/(72-101) 148/74 (10/01 0603) SpO2:  [99 %-100 %] 100 % (10/01 0603) Weight:  [128.4 kg] 128.4 kg (09/30 1010)  Intake/Output from previous day:  Intake/Output Summary (Last 24 hours) at 03/15/2023 0821 Last data filed at 03/15/2023 0610 Gross per 24 hour  Intake 2324.66 ml  Output 2400 ml  Net -75.34 ml     Intake/Output this shift: No intake/output data recorded.  Labs: Recent Labs    03/15/23 0333  HGB 12.1*   Recent Labs    03/15/23 0333  WBC 15.5*  RBC 3.60*  HCT 36.4*  PLT 279   Recent Labs    03/15/23 0333  NA 131*  K 4.0  CL 97*  CO2 25  BUN 18  CREATININE 0.75  GLUCOSE 153*  CALCIUM 8.5*   No results for input(s): "LABPT", "INR" in the last 72 hours.  Exam: General - Patient is Alert and Oriented Extremity - Neurologically intact Neurovascular intact Sensation intact distally Dorsiflexion/Plantar flexion intact Dressing - dressing C/D/I Motor Function - intact, moving foot and toes well on exam.   Past Medical History:  Diagnosis Date   Allergy    seasonal   Arthritis     left knee,  right hip   Back pain    Bilateral swelling of feet    Complication of anesthesia    half awake during last colonscopy   Constipation    Diabetes mellitus    type 2   Heart murmur    Hx of adenomatous colonic polyps    2010 6 mm adenoma 10/06/2016 2 5 mm descending polyps    Hyperlipidemia    Hypertension    Joint pain    Lactose intolerance      Assessment/Plan: 1 Day Post-Op Procedure(s) (LRB): TOTAL HIP ARTHROPLASTY ANTERIOR APPROACH (Right) Principal Problem:   OA (osteoarthritis) of hip Active Problems:   Osteoarthritis of right hip  Estimated body mass index is 40.61 kg/m as calculated from the following:   Height as of this encounter: 5\' 10"  (1.778 m).   Weight as of this encounter: 128.4 kg. Advance diet Up with therapy D/C IV fluids  DVT Prophylaxis - Aspirin Weight bearing as tolerated. Continue therapy. Plan is to go Home after hospital stay.  Possible discharge with HEP later today if progresses with therapy and pain controlled (on chronic pain management) Follow-up in the office in 2 weeks.  The PDMP database was reviewed today prior to any opioid medications being prescribed to this patient.  Arther Abbott, PA-C Orthopedic Surgery [phone] 03/15/2023, 8:21 AM

## 2023-03-16 NOTE — Discharge Summary (Signed)
Patient ID: Richard [name] MRN: [id number] DOB/AGE: [birth year] 72 y.o.  Admit date: 03/14/2023 Discharge date: 03/15/2023  Admission Diagnoses:  Principal Problem:   OA (osteoarthritis) of hip Active Problems:   Osteoarthritis of right hip   Discharge Diagnoses:  Same  Past Medical History:  Diagnosis Date   Allergy    seasonal   Arthritis     left knee,  right hip   Back pain    Bilateral swelling of feet    Complication of anesthesia    half awake during last colonscopy   Constipation    Diabetes mellitus    type 2   Heart murmur    Hx of adenomatous colonic polyps    2010 6 mm adenoma 10/06/2016 2 5 mm descending polyps    Hyperlipidemia    Hypertension    Joint pain    Lactose intolerance     Surgeries: Procedure(s): TOTAL HIP ARTHROPLASTY ANTERIOR APPROACH on 03/14/2023   Consultants:   Discharged Condition: Improved  Hospital Course: Richard [name] is an 72 y.o. male who was admitted 03/14/2023 for operative treatment ofOA (osteoarthritis) of hip. Patient has severe unremitting pain that affects sleep, daily activities, and work/hobbies. After pre-op clearance the patient was taken to the operating room on 03/14/2023 and underwent  Procedure(s): TOTAL HIP ARTHROPLASTY ANTERIOR APPROACH.    Patient was given perioperative antibiotics:  Anti-infectives (From admission, onward)    Start     Dose/Rate Route Frequency Ordered Stop   03/15/23 0800  ceFAZolin (ANCEF) IVPB 2g/100 mL premix        2 g 200 mL/hr over 30 Minutes Intravenous  Once 03/15/23 0709 03/15/23 0845   03/14/23 1915  ceFAZolin (ANCEF) IVPB 2g/100 mL premix        2 g 200 mL/hr over 30 Minutes Intravenous Every 6 hours 03/14/23 1838 03/15/23 0714   03/14/23 1015  ceFAZolin (ANCEF) IVPB 3g/100 mL premix  Status:  Discontinued        3 g 200 mL/hr over 30 Minutes Intravenous On call to O.R. 03/14/23 1007 03/14/23 1517        Patient was given sequential compression devices, early ambulation,  and chemoprophylaxis to prevent DVT.  Patient benefited maximally from hospital stay and there were no complications.    Recent vital signs: No data found.   Recent laboratory studies:  Recent Labs    03/15/23 0333  WBC 15.5*  HGB 12.1*  HCT 36.4*  PLT 279  NA 131*  K 4.0  CL 97*  CO2 25  BUN 18  CREATININE 0.75  GLUCOSE 153*  CALCIUM 8.5*     Discharge Medications:   Allergies as of 03/15/2023       Reactions   Semaglutide Other (See Comments)   RYBELSUS. Severe headaches.         Medication List     STOP taking these medications    ibuprofen 200 MG tablet Commonly known as: ADVIL       TAKE these medications    Accu-Chek Guide Me w/Device Kit Check blood sugars once daily   Accu-Chek Guide test strip Generic drug: glucose blood Check blood sugars once daily   Accu-Chek Softclix Lancets lancets Check blood sugars once daily   acetaminophen 500 MG tablet Commonly known as: TYLENOL Take 500-1,000 mg by mouth every 6 (six) hours as needed for mild pain or moderate pain (pain.).   Aspirin Low Dose 81 MG chewable tablet Generic drug: aspirin Chew 1 tablet (81 mg total) by  mouth 2 (two) times daily for 20 days. Then take one 81 mg aspirin once a day for three weeks. Then discontinue aspirin.   atorvastatin 40 MG tablet Commonly known as: LIPITOR TAKE 1 TABLET BY MOUTH EVERYDAY AT BEDTIME   B-D UF III MINI PEN NEEDLES 31G X 5 MM Misc Generic drug: Insulin Pen Needle USE AS DIRECTED   dapagliflozin propanediol 5 MG Tabs tablet Commonly known as: Farxiga Take 1 tablet (5 mg total) by mouth daily.   diltiazem 180 MG 24 hr capsule Commonly known as: Cardizem CD Take 1 capsule (180 mg total) by mouth daily.   lubiprostone 24 MCG capsule Commonly known as: AMITIZA Take 1 capsule (24 mcg total) by mouth 2 (two) times daily with a meal.   metFORMIN 500 MG tablet Commonly known as: GLUCOPHAGE TAKE 2 TABLETS (1,000 MG TOTAL) BY MOUTH 2 (TWO)  TIMES DAILY WITH A MEAL.   olmesartan-hydrochlorothiazide 40-25 MG tablet Commonly known as: BENICAR HCT Take 1 tablet by mouth daily.   ondansetron 4 MG tablet Commonly known as: ZOFRAN Take 1 tablet (4 mg total) by mouth every 6 (six) hours as needed for nausea.   oxyCODONE-acetaminophen 10-325 MG tablet Commonly known as: PERCOCET Take 1 tablet by mouth every 8 (eight) hours as needed for pain.   tiZANidine 4 MG tablet Commonly known as: ZANAFLEX Take 1 tablet (4 mg total) by mouth every 6 (six) hours as needed for muscle spasms.   traMADol 50 MG tablet Commonly known as: ULTRAM Take 1-2 tablets (50-100 mg total) by mouth every 6 (six) hours as needed for moderate pain.   triamcinolone cream 0.1 % Commonly known as: KENALOG Apply 1 Application topically 2 (two) times daily.   Vitamin D (Ergocalciferol) 1.25 MG (50000 UNIT) Caps capsule Commonly known as: DRISDOL Take 1 capsule (50,000 Units total) by mouth every 7 (seven) days.               Discharge Care Instructions  (From admission, onward)           Start     Ordered   03/15/23 0000  Weight bearing as tolerated        03/15/23 0826   03/15/23 0000  Change dressing       Comments: You have an adhesive waterproof bandage over the incision. Leave this in place until your first follow-up appointment. Once you remove this you will not need to place another bandage.   03/15/23 0826            Diagnostic Studies: DG HIP UNILAT WITH PELVIS 1V RIGHT  Result Date: 03/14/2023 CLINICAL DATA:  Right total hip arthroplasty. EXAM: DG HIP (WITH OR WITHOUT PELVIS) 1V RIGHT COMPARISON:  None recent.  Right hip radiographs 02/21/2020. FINDINGS: C-arm fluoroscopy was provided in the operating room without the presence of a radiologist.10 seconds fluoroscopy time. 2.5 mGy air kerma. Five C-arm fluoroscopic images were obtained intraoperatively and are submitted for post operative interpretation. These images demonstrate  the sequential performance of a right total hip arthroplasty. The hardware appears well positioned on the final images. No complications are identified. There is gas within the joint and lateral soft tissues. Please see intraoperative findings for further detail. IMPRESSION: Intraoperative fluoroscopic guidance for right total hip arthroplasty. Electronically Signed   By: Carey Bullocks M.D.   On: 03/14/2023 19:05   DG C-Arm 1-60 Min-No Report  Result Date: 03/14/2023 Fluoroscopy was utilized by the requesting physician.  No radiographic interpretation.   DG  C-Arm 1-60 Min-No Report  Result Date: 03/14/2023 Fluoroscopy was utilized by the requesting physician.  No radiographic interpretation.   MYOCARDIAL PERFUSION IMAGING  Result Date: 03/04/2023   LV perfusion is normal. There is no evidence of ischemia. There is no evidence of infarction. Diaphragm attenuation noted.   Left ventricular function is normal. Nuclear stress EF: 69%. The left ventricular ejection fraction is hyperdynamic (>65%). End diastolic cavity size is normal.   The study is normal. The study is low risk.   ECHOCARDIOGRAM COMPLETE  Result Date: 03/01/2023    ECHOCARDIOGRAM REPORT   Patient Name:   Richard [name] Date of Exam: 03/01/2023 Medical Rec #:  [id number]       Height:       70.0 in Accession #:    [id number]      Weight:       283.0 lb Date of Birth:  September 25, 1950        BSA:          2.419 m Patient Age:    72 years        BP:           112/60 mmHg Patient Gender: M               HR:           77 bpm. Exam Location:  [address] Procedure: 2D Echo, Cardiac Doppler and Color Doppler Indications:    Z01.810 Preoperative Cardiovascular Examination.  History:        Patient has no prior history of Echocardiogram examinations.                 Risk Factors:Hypertension, Diabetes and Dyslipidemia.  Sonographer:    Sedonia Small Rodgers-Jones RDCS Referring Phys: Rito Ehrlich [facility] IMPRESSIONS  1. Left ventricular ejection  fraction, by estimation, is 60 to 65%. The left ventricle has normal function. The left ventricle has no regional wall motion abnormalities. There is mild concentric left ventricular hypertrophy. Left ventricular diastolic parameters are indeterminate.  2. Right ventricular systolic function is normal. The right ventricular size is mildly enlarged.  3. The mitral valve is normal in structure. Trivial mitral valve regurgitation. No evidence of mitral stenosis.  4. The aortic valve is normal in structure. Aortic valve regurgitation is not visualized. No aortic stenosis is present.  5. The inferior vena cava is normal in size with greater than 50% respiratory variability, suggesting right atrial pressure of 3 mmHg. FINDINGS  Left Ventricle: Left ventricular ejection fraction, by estimation, is 60 to 65%. The left ventricle has normal function. The left ventricle has no regional wall motion abnormalities. The left ventricular internal cavity size was normal in size. There is  mild concentric left ventricular hypertrophy. Left ventricular diastolic parameters are indeterminate. Right Ventricle: The right ventricular size is mildly enlarged. No increase in right ventricular wall thickness. Right ventricular systolic function is normal. Left Atrium: Left atrial size was normal in size. Right Atrium: Right atrial size was normal in size. Pericardium: There is no evidence of pericardial effusion. Mitral Valve: The mitral valve is normal in structure. Trivial mitral valve regurgitation. No evidence of mitral valve stenosis. Tricuspid Valve: The tricuspid valve is normal in structure. Tricuspid valve regurgitation is mild . No evidence of tricuspid stenosis. Aortic Valve: The aortic valve is normal in structure. Aortic valve regurgitation is not visualized. No aortic stenosis is present. Pulmonic Valve: The pulmonic valve was normal in structure. Pulmonic valve regurgitation is not visualized. No evidence of  pulmonic stenosis.  Aorta: The aortic root is normal in size and structure. Venous: The inferior vena cava is normal in size with greater than 50% respiratory variability, suggesting right atrial pressure of 3 mmHg. IAS/Shunts: No atrial level shunt detected by color flow Doppler.  LEFT VENTRICLE PLAX 2D LVIDd:         5.10 cm   Diastology LVIDs:         2.70 cm   LV e' medial:    10.40 cm/s LV PW:         1.10 cm   LV E/e' medial:  9.2 LV IVS:        1.10 cm   LV e' lateral:   13.90 cm/s LVOT diam:     2.40 cm   LV E/e' lateral: 6.9 LV SV:         100 LV SV Index:   41 LVOT Area:     4.52 cm  RIGHT VENTRICLE             IVC RV Basal diam:  5.30 cm     IVC diam: 1.80 cm RV S prime:     18.80 cm/s TAPSE (M-mode): 4.0 cm LEFT ATRIUM             Index        RIGHT ATRIUM           Index LA diam:        4.30 cm 1.78 cm/m   RA Area:     16.60 cm LA Vol (A2C):   51.8 ml 21.41 ml/m  RA Volume:   48.10 ml  19.88 ml/m LA Vol (A4C):   52.8 ml 21.83 ml/m LA Biplane Vol: 55.6 ml 22.98 ml/m  AORTIC VALVE LVOT Vmax:   99.00 cm/s LVOT Vmean:  68.300 cm/s LVOT VTI:    0.221 m  AORTA Ao Root diam: 3.10 cm Ao Asc diam:  3.30 cm MITRAL VALVE               TRICUSPID VALVE MV Area (PHT): 4.53 cm    TR Peak grad:   20.6 mmHg MV Decel Time: 168 msec    TR Vmax:        227.00 cm/s MV E velocity: 95.30 cm/s MV A velocity: 79.90 cm/s  SHUNTS MV E/A ratio:  1.19        Systemic VTI:  0.22 m                            Systemic Diam: 2.40 cm Kardie Tobb DO Electronically signed by Thomasene Ripple DO Signature Date/Time: 03/01/2023/12:28:34 PM    Final     Disposition: Discharge disposition: 01-Home or Self Care       Discharge Instructions     Call MD / Call 911   Complete by: As directed    If you experience chest pain or shortness of breath, CALL 911 and be transported to the hospital emergency room.  If you develope a fever above 101 F, pus (white drainage) or increased drainage or redness at the wound, or calf pain, call your surgeon's office.    Change dressing   Complete by: As directed    You have an adhesive waterproof bandage over the incision. Leave this in place until your first follow-up appointment. Once you remove this you will not need to place another bandage.   Constipation Prevention   Complete by: As directed  Drink plenty of fluids.  Prune juice may be helpful.  You may use a stool softener, such as Colace (over the counter) 100 mg twice a day.  Use MiraLax (over the counter) for constipation as needed.   Diet - low sodium heart healthy   Complete by: As directed    Do not sit on low chairs, stoools or toilet seats, as it may be difficult to get up from low surfaces   Complete by: As directed    Driving restrictions   Complete by: As directed    No driving for two weeks   Post-operative opioid taper instructions:   Complete by: As directed    POST-OPERATIVE OPIOID TAPER INSTRUCTIONS: It is important to wean off of your opioid medication as soon as possible. If you do not need pain medication after your surgery it is ok to stop day one. Opioids include: Codeine, Hydrocodone(Norco, Vicodin), Oxycodone(Percocet, oxycontin) and hydromorphone amongst others.  Long term and even short term use of opiods can cause: Increased pain response Dependence Constipation Depression Respiratory depression And more.  Withdrawal symptoms can include Flu like symptoms Nausea, vomiting And more Techniques to manage these symptoms Hydrate well Eat regular healthy meals Stay active Use relaxation techniques(deep breathing, meditating, yoga) Do Not substitute Alcohol to help with tapering If you have been on opioids for less than two weeks and do not have pain than it is ok to stop all together.  Plan to wean off of opioids This plan should start within one week post op of your joint replacement. Maintain the same interval or time between taking each dose and first decrease the dose.  Cut the total daily intake of opioids  by one tablet each day Next start to increase the time between doses. The last dose that should be eliminated is the evening dose.      TED hose   Complete by: As directed    Use stockings (TED hose) for three weeks on both leg(s).  You may remove them at night for sleeping.   Weight bearing as tolerated   Complete by: As directed         Follow-up Information     Aluisio, Homero Fellers, MD Follow up in 2 week(s).   Specialty: Orthopedic Surgery Contact information: [address] [place] [address] [phone]                  Signed: Arther Abbott 03/16/2023, 12:46 PM

## 2023-03-17 ENCOUNTER — Other Ambulatory Visit (INDEPENDENT_AMBULATORY_CARE_PROVIDER_SITE_OTHER): Payer: Self-pay | Admitting: Family Medicine

## 2023-03-17 DIAGNOSIS — E559 Vitamin D deficiency, unspecified: Secondary | ICD-10-CM

## 2023-03-21 ENCOUNTER — Encounter (INDEPENDENT_AMBULATORY_CARE_PROVIDER_SITE_OTHER): Payer: Self-pay

## 2023-03-21 ENCOUNTER — Telehealth (INDEPENDENT_AMBULATORY_CARE_PROVIDER_SITE_OTHER): Payer: Self-pay | Admitting: Family Medicine

## 2023-03-21 NOTE — Telephone Encounter (Signed)
Patient called in requesting someone to email his 13 page meal plan to him. I asked patient if he knew which meal plan is on and he stated he wasn't sure but it has 13 pages. Patient's email address is:  [email or web address]  Thank you

## 2023-03-21 NOTE — Telephone Encounter (Signed)
Recipes sent in message

## 2023-03-22 ENCOUNTER — Telehealth: Payer: Self-pay | Admitting: Family Medicine

## 2023-03-22 NOTE — Telephone Encounter (Signed)
Pt called stating that he was looking to have a change regarding his pre op info. Pt stated that he thinks he is ok to go without the heart monitor and wanted to have that changed to reflect this in his paperwork.

## 2023-03-23 NOTE — Addendum Note (Signed)
Addended by: [name] on: 03/23/2023 10:59 AM   Modules accepted: Orders

## 2023-03-24 ENCOUNTER — Other Ambulatory Visit: Payer: Self-pay | Admitting: Family Medicine

## 2023-03-24 DIAGNOSIS — M25551 Pain in right hip: Secondary | ICD-10-CM

## 2023-03-25 ENCOUNTER — Other Ambulatory Visit: Payer: Self-pay | Admitting: Family Medicine

## 2023-03-25 DIAGNOSIS — M25551 Pain in right hip: Secondary | ICD-10-CM

## 2023-03-25 MED ORDER — OXYCODONE-ACETAMINOPHEN 10-325 MG PO TABS: 1.0000 | ORAL_TABLET | Freq: Three times a day (TID) | ORAL | 0 refills | Status: DC | PRN

## 2023-03-25 NOTE — Telephone Encounter (Signed)
Paperwork changed and faxed

## 2023-03-25 NOTE — Telephone Encounter (Signed)
Requesting:  oxycodone 10-325mg  Contract: 12/01/21 UDS: 12/01/21 Last Visit: 02/22/23 Next Visit: None Last Refill: 02/22/23 #90 and 0RF  Please Advise

## 2023-03-30 ENCOUNTER — Other Ambulatory Visit: Payer: Self-pay | Admitting: Family Medicine

## 2023-03-30 DIAGNOSIS — E1151 Type 2 diabetes mellitus with diabetic peripheral angiopathy without gangrene: Secondary | ICD-10-CM

## 2023-04-28 ENCOUNTER — Other Ambulatory Visit: Payer: Self-pay | Admitting: Family Medicine

## 2023-04-28 ENCOUNTER — Other Ambulatory Visit (INDEPENDENT_AMBULATORY_CARE_PROVIDER_SITE_OTHER): Payer: Self-pay | Admitting: Family Medicine

## 2023-04-28 DIAGNOSIS — M25551 Pain in right hip: Secondary | ICD-10-CM

## 2023-04-28 DIAGNOSIS — E559 Vitamin D deficiency, unspecified: Secondary | ICD-10-CM

## 2023-04-28 MED ORDER — OXYCODONE-ACETAMINOPHEN 10-325 MG PO TABS: 1.0000 | ORAL_TABLET | Freq: Three times a day (TID) | ORAL | 0 refills | Status: DC | PRN

## 2023-04-28 NOTE — Telephone Encounter (Signed)
Requesting: oxycodone 10-325mg  Contract: 12/01/21 UDS: 12/01/21 Last Visit: 02/22/23 Next Visit: None Last Refill: 03/25/23 #90 and 0RF   Please Advise

## 2023-05-06 ENCOUNTER — Encounter: Payer: Self-pay | Admitting: Family Medicine

## 2023-05-06 ENCOUNTER — Ambulatory Visit: Payer: BC Managed Care – PPO | Admitting: Family Medicine

## 2023-05-06 ENCOUNTER — Other Ambulatory Visit (HOSPITAL_BASED_OUTPATIENT_CLINIC_OR_DEPARTMENT_OTHER): Payer: Self-pay

## 2023-05-06 VITALS — BP 150/70 | HR 81 | Temp 98.2°F | Resp 20 | Ht 70.0 in | Wt 285.8 lb

## 2023-05-06 DIAGNOSIS — E1159 Type 2 diabetes mellitus with other circulatory complications: Secondary | ICD-10-CM | POA: Diagnosis not present

## 2023-05-06 DIAGNOSIS — Z0000 Encounter for general adult medical examination without abnormal findings: Secondary | ICD-10-CM

## 2023-05-06 DIAGNOSIS — I10 Essential (primary) hypertension: Secondary | ICD-10-CM

## 2023-05-06 DIAGNOSIS — E785 Hyperlipidemia, unspecified: Secondary | ICD-10-CM | POA: Diagnosis not present

## 2023-05-06 DIAGNOSIS — E1151 Type 2 diabetes mellitus with diabetic peripheral angiopathy without gangrene: Secondary | ICD-10-CM

## 2023-05-06 DIAGNOSIS — Z23 Encounter for immunization: Secondary | ICD-10-CM | POA: Diagnosis not present

## 2023-05-06 DIAGNOSIS — I152 Hypertension secondary to endocrine disorders: Secondary | ICD-10-CM

## 2023-05-06 DIAGNOSIS — M25551 Pain in right hip: Secondary | ICD-10-CM

## 2023-05-06 DIAGNOSIS — K5903 Drug induced constipation: Secondary | ICD-10-CM

## 2023-05-06 DIAGNOSIS — Z7984 Long term (current) use of oral hypoglycemic drugs: Secondary | ICD-10-CM

## 2023-05-06 DIAGNOSIS — T402X5A Adverse effect of other opioids, initial encounter: Secondary | ICD-10-CM

## 2023-05-06 DIAGNOSIS — E118 Type 2 diabetes mellitus with unspecified complications: Secondary | ICD-10-CM | POA: Diagnosis not present

## 2023-05-06 MED ORDER — DILTIAZEM HCL ER COATED BEADS 180 MG PO CP24
180.0000 mg | ORAL_CAPSULE | Freq: Every day | ORAL | 1 refills | Status: DC
Start: 2023-05-06 — End: 2023-10-06

## 2023-05-06 MED ORDER — BD PEN NEEDLE MINI U/F 31G X 5 MM MISC: 0 refills | Status: AC

## 2023-05-06 MED ORDER — ATORVASTATIN CALCIUM 40 MG PO TABS
40.0000 mg | ORAL_TABLET | Freq: Every day | ORAL | 1 refills | Status: DC
Start: 2023-05-06 — End: 2023-11-24

## 2023-05-06 MED ORDER — LUBIPROSTONE 24 MCG PO CAPS
24.0000 ug | ORAL_CAPSULE | Freq: Two times a day (BID) | ORAL | 5 refills | Status: DC
Start: 2023-05-06 — End: 2023-07-15

## 2023-05-06 MED ORDER — OXYCODONE-ACETAMINOPHEN 10-325 MG PO TABS: 1.0000 | ORAL_TABLET | Freq: Three times a day (TID) | ORAL | 0 refills | Status: DC | PRN

## 2023-05-06 MED ORDER — METFORMIN HCL 500 MG PO TABS: 1000.0000 mg | ORAL_TABLET | Freq: Two times a day (BID) | ORAL | 1 refills | Status: DC

## 2023-05-06 MED ORDER — OLMESARTAN MEDOXOMIL-HCTZ 40-25 MG PO TABS: 1.0000 | ORAL_TABLET | Freq: Every day | ORAL | 1 refills | Status: DC

## 2023-05-06 MED ORDER — DAPAGLIFLOZIN PROPANEDIOL 5 MG PO TABS
5.0000 mg | ORAL_TABLET | Freq: Every day | ORAL | 1 refills | Status: DC
Start: 2023-05-06 — End: 2023-10-06

## 2023-05-06 MED ORDER — COVID-19 MRNA VAC-TRIS(PFIZER) 30 MCG/0.3ML IM SUSY
0.3000 mL | PREFILLED_SYRINGE | Freq: Once | INTRAMUSCULAR | 0 refills | Status: AC
  Filled 2023-05-06: qty 0.3, 1d supply, fill #0

## 2023-05-06 MED ORDER — TIZANIDINE HCL 4 MG PO TABS: 4.0000 mg | ORAL_TABLET | Freq: Four times a day (QID) | ORAL | 1 refills | Status: DC | PRN

## 2023-05-06 MED ORDER — ACCU-CHEK GUIDE ME W/DEVICE KIT
PACK | 0 refills | Status: AC
Start: 2023-05-06 — End: ?

## 2023-05-06 NOTE — Progress Notes (Signed)
Established Patient Office Visit  Subjective   Patient ID: Richard [name], male    DOB: [birth year]  Age: 72 y.o. MRN: [id number]  Chief Complaint  Patient presents with   Hypertension   Hyperlipidemia   Diabetes   Follow-up    HPI Discussed the use of AI scribe software for clinical note transcription with the patient, who gave verbal consent to proceed.  History of Present Illness   The patient, with a history of chronic pain, underwent a surgical procedure which has resulted in decreased pain on the right side. However, he reports persistent pain on the left side, for which another surgery is scheduled in March. The patient describes the overall experience as a "surrealistic nightmare."  The patient also reports issues with his vision and had an eye exam in August. He expresses dissatisfaction with the doctor who performed the exam, preferring his usual ophthalmologist.  The patient has diabetes and has been unable to monitor his blood sugar for the past ten days due to a lack of test strips. He expresses skepticism about the upcoming availability of over-the-counter continuous glucose monitors.  The patient also reports taking tramadol post-surgery but is currently on oxycodone. He expresses a preference for tramadol over oxycodone.  The patient underwent a stress test and echocardiogram prior to his recent surgery. He expresses frustration with the scheduling process, which required him to find a different doctor to perform the tests in a timely manner.  The patient's recent surgery was more complex than anticipated due to the bone being fused to the pelvis, resulting in a longer operation time. The patient was under conscious sedation for the procedure. Post-operatively, he experienced a sensation of his legs being bent, likely due to the position he was in when the spinal anesthesia was administered.      Patient Active Problem List   Diagnosis Date Noted   Therapeutic  opioid-induced constipation (OIC) 05/06/2023   OA (osteoarthritis) of hip 03/14/2023   Osteoarthritis of right hip 03/14/2023   Preventative health care 02/25/2023   Cardiac murmur 02/25/2023   Right bundle branch block 02/23/2023   Pre-op evaluation 02/23/2023   Syncope 02/23/2023   Right shoulder pain 02/25/2022   Acute right otitis media 02/25/2022   Morbid obesity (HCC) 02/25/2022   Type 2 diabetes mellitus with hyperglycemia, without long-term current use of insulin (HCC) 06/02/2021   Cough 12/30/2020   Controlled type 2 diabetes mellitus with complication, without long-term current use of insulin (HCC) 02/21/2020   Right hip pain 08/16/2018   Viral upper respiratory tract infection 03/10/2017   Hx of adenomatous colonic polyps    Benign neoplasm of descending colon    Essential hypertension 09/06/2016   Tooth abscess 09/01/2015   Sinusitis 11/08/2014   Sinusitis, acute 10/28/2014   Trigger ring finger of right hand 02/06/2013   Suspicious nevus 02/06/2013   Avulsion of toenail 12/11/2010   Acute sinusitis 08/17/2010   Open wound of finger 07/10/2010   SCIATICA, LEFT 09/02/2009   UNSPECIFIED PERIPHERAL VASCULAR DISEASE 07/04/2009   Hyperlipidemia 05/05/2009   HEARTBURN 04/22/2009   Type 2 diabetes mellitus with diabetic peripheral angiopathy without gangrene (HCC) 01/23/2009   Past Medical History:  Diagnosis Date   Allergy    seasonal   Arthritis     left knee,  right hip   Back pain    Bilateral swelling of feet    Complication of anesthesia    half awake during last colonscopy   Constipation  Diabetes mellitus    type 2   Heart murmur    Hx of adenomatous colonic polyps    2010 6 mm adenoma 10/06/2016 2 5 mm descending polyps    Hyperlipidemia    Hypertension    Joint pain    Lactose intolerance    Past Surgical History:  Procedure Laterality Date   COLONOSCOPY     COLONOSCOPY WITH PROPOFOL N/A 10/06/2016   Procedure: COLONOSCOPY WITH PROPOFOL;   Surgeon: [name], MD;  Location: [facility];  Service: Endoscopy;  Laterality: N/A;   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   Procedure: TOTAL HIP ARTHROPLASTY ANTERIOR APPROACH;  Surgeon: [name], MD;  Location: [facility];  Service: Orthopedics;  Laterality: Right;   Social History   Tobacco Use   Smoking status: Never   Smokeless tobacco: Never  Vaping Use   Vaping status: Never Used  Substance Use Topics   Alcohol use: Yes    Alcohol/week: 1.0 standard drink of alcohol    Types: 1 Standard drinks or equivalent per week    Comment: daily   Drug use: No   Social History   Socioeconomic History   Marital status: Married    Spouse name: Not on file   Number of children: Not on file   Years of education: Not on file   Highest education level: Not on file  Occupational History   Not on file  Tobacco Use   Smoking status: Never   Smokeless tobacco: Never  Vaping Use   Vaping status: Never Used  Substance and Sexual Activity   Alcohol use: Yes    Alcohol/week: 1.0 standard drink of alcohol    Types: 1 Standard drinks or equivalent per week    Comment: daily   Drug use: No   Sexual activity: Not on file  Other Topics Concern   Not on file  Social History Narrative   Not on file   Social Determinants of Health   Financial Resource Strain: Not on file  Food Insecurity: No Food Insecurity (03/15/2023)   Hunger Vital Sign    Worried About Running Out of Food in the Last Year: Never true    Ran Out of Food in the Last Year: Never true  Transportation Needs: No Transportation Needs (03/15/2023)   PRAPARE - Administrator, Civil Service (Medical): No    Lack of Transportation (Non-Medical): No  Physical Activity: Not on file  Stress: Not on file  Social Connections: Not on file  Intimate Partner Violence: Not At Risk (03/15/2023)   Humiliation, Afraid, Rape, and Kick questionnaire    Fear of Current or Ex-Partner: No    Emotionally Abused: No     Physically Abused: No    Sexually Abused: No   Family Status  Relation Name Status   Mother  (Not Specified)   Sister  (Not Specified)   Brother  (Not Specified)   Neg Hx  (Not Specified)  No partnership data on file   Family History  Problem Relation Age of Onset   Hyperlipidemia Mother    Hypertension Mother    Diabetes Mother    Diabetes Sister    Hypertension Sister    Stroke Sister    Diabetes Brother    Colon cancer Neg Hx    Colon polyps Neg Hx    Rectal cancer Neg Hx    Stomach cancer Neg Hx    Esophageal cancer Neg Hx    Allergies  Allergen Reactions  Semaglutide Other (See Comments)    RYBELSUS. Severe headaches.       Review of Systems  Constitutional:  Negative for fever and malaise/fatigue.  HENT:  Negative for congestion.   Eyes:  Negative for blurred vision.  Respiratory:  Negative for shortness of breath.   Cardiovascular:  Negative for chest pain, palpitations and leg swelling.  Gastrointestinal:  Negative for abdominal pain, blood in stool and nausea.  Genitourinary:  Negative for dysuria and frequency.  Musculoskeletal:  Negative for falls.  Skin:  Negative for rash.  Neurological:  Negative for dizziness, loss of consciousness and headaches.  Endo/Heme/Allergies:  Negative for environmental allergies.  Psychiatric/Behavioral:  Negative for depression. The patient is not nervous/anxious.       Objective:     BP (!) 150/70 (BP Location: Right Arm, Patient Position: Sitting, Cuff Size: Large)   Pulse 81   Temp 98.2 F (36.8 C) (Oral)   Resp 20   Ht 5\' 10"  (1.778 m)   Wt 285 lb 12.8 oz (129.6 kg)   SpO2 98%   BMI 41.01 kg/m  BP Readings from Last 3 Encounters:  05/06/23 (!) 150/70  03/15/23 (!) 111/52  03/01/23 (!) 169/69   Wt Readings from Last 3 Encounters:  05/06/23 285 lb 12.8 oz (129.6 kg)  03/14/23 283 lb (128.4 kg)  03/03/23 286 lb (129.7 kg)   SpO2 Readings from Last 3 Encounters:  05/06/23 98%  03/15/23 100%   03/01/23 99%      Physical Exam Vitals and nursing note reviewed.  Constitutional:      General: He is not in acute distress.    Appearance: Normal appearance. He is well-developed.  HENT:     Head: Normocephalic and atraumatic.  Eyes:     General: No scleral icterus.       Right eye: No discharge.        Left eye: No discharge.  Cardiovascular:     Rate and Rhythm: Normal rate and regular rhythm.     Heart sounds: No murmur heard. Pulmonary:     Effort: Pulmonary effort is normal. No respiratory distress.     Breath sounds: Normal breath sounds.  Musculoskeletal:        General: Normal range of motion.     Cervical back: Normal range of motion and neck supple.     Right lower leg: No edema.     Left lower leg: No edema.  Skin:    General: Skin is warm and dry.  Neurological:     Mental Status: He is alert and oriented to person, place, and time.  Psychiatric:        Mood and Affect: Mood normal.        Behavior: Behavior normal.        Thought Content: Thought content normal.        Judgment: Judgment normal.      No results found for any visits on 05/06/23.  Last CBC Lab Results  Component Value Date   WBC 15.5 (H) 03/15/2023   HGB 12.1 (L) 03/15/2023   HCT 36.4 (L) 03/15/2023   MCV 101.1 (H) 03/15/2023   MCH 33.6 03/15/2023   RDW 12.1 03/15/2023   PLT 279 03/15/2023   Last metabolic panel Lab Results  Component Value Date   GLUCOSE 153 (H) 03/15/2023   NA 131 (L) 03/15/2023   K 4.0 03/15/2023   CL 97 (L) 03/15/2023   CO2 25 03/15/2023   BUN 18 03/15/2023  CREATININE 0.75 03/15/2023   GFRNONAA >60 03/15/2023   CALCIUM 8.5 (L) 03/15/2023   PROT 7.1 02/22/2023   ALBUMIN 4.2 02/22/2023   BILITOT 1.3 (H) 02/22/2023   ALKPHOS 64 02/22/2023   AST 16 02/22/2023   ALT 10 02/22/2023   ANIONGAP 9 03/15/2023   Last lipids Lab Results  Component Value Date   CHOL 186 02/22/2023   HDL 75.80 02/22/2023   LDLCALC 83 02/22/2023   LDLDIRECT 133.0  12/05/2017   TRIG 138.0 02/22/2023   CHOLHDL 2 02/22/2023   Last hemoglobin A1c Lab Results  Component Value Date   HGBA1C 6.3 02/22/2023   Last thyroid functions Lab Results  Component Value Date   TSH 0.40 11/30/2022   T3TOTAL 90 06/22/2022   Last vitamin D Lab Results  Component Value Date   VD25OH 39.18 11/30/2022   Last vitamin B12 and Folate Lab Results  Component Value Date   VITAMINB12 121 (L) 11/30/2022   FOLATE 9.6 06/22/2022      The 10-year ASCVD risk score (Arnett DK, et al., 2019) is: 37.4%    Assessment & Plan:   Problem List Items Addressed This Visit       Unprioritized   Right hip pain   Relevant Medications   oxyCODONE-acetaminophen (PERCOCET) 10-325 MG tablet   tiZANidine (ZANAFLEX) 4 MG tablet   Type 2 diabetes mellitus with diabetic peripheral angiopathy without gangrene (HCC)   Relevant Medications   atorvastatin (LIPITOR) 40 MG tablet   dapagliflozin propanediol (FARXIGA) 5 MG TABS tablet   diltiazem (CARDIZEM CD) 180 MG 24 hr capsule   metFORMIN (GLUCOPHAGE) 500 MG tablet   olmesartan-hydrochlorothiazide (BENICAR HCT) 40-25 MG tablet   Other Relevant Orders   Lipid panel   CBC with Differential/Platelet   Comprehensive metabolic panel   Hemoglobin A1c   Microalbumin / creatinine urine ratio   Therapeutic opioid-induced constipation (OIC)   Relevant Medications   lubiprostone (AMITIZA) 24 MCG capsule   Preventative health care    Return to office 6 months 'for cpe Return to office 3 month for labs      Relevant Medications   Blood Glucose Monitoring Suppl (ACCU-CHEK GUIDE ME) w/Device KIT   Hyperlipidemia   Relevant Medications   atorvastatin (LIPITOR) 40 MG tablet   diltiazem (CARDIZEM CD) 180 MG 24 hr capsule   olmesartan-hydrochlorothiazide (BENICAR HCT) 40-25 MG tablet   Other Relevant Orders   Lipid panel   CBC with Differential/Platelet   Comprehensive metabolic panel   Hemoglobin A1c   Microalbumin / creatinine  urine ratio   Essential hypertension   Relevant Medications   atorvastatin (LIPITOR) 40 MG tablet   diltiazem (CARDIZEM CD) 180 MG 24 hr capsule   olmesartan-hydrochlorothiazide (BENICAR HCT) 40-25 MG tablet   Other Relevant Orders   Lipid panel   CBC with Differential/Platelet   Comprehensive metabolic panel   Hemoglobin A1c   Microalbumin / creatinine urine ratio   Controlled type 2 diabetes mellitus with complication, without long-term current use of insulin (HCC) - Primary   Relevant Medications   atorvastatin (LIPITOR) 40 MG tablet   dapagliflozin propanediol (FARXIGA) 5 MG TABS tablet   metFORMIN (GLUCOPHAGE) 500 MG tablet   olmesartan-hydrochlorothiazide (BENICAR HCT) 40-25 MG tablet   Other Relevant Orders   Lipid panel   CBC with Differential/Platelet   Comprehensive metabolic panel   Hemoglobin A1c   Microalbumin / creatinine urine ratio   Other Visit Diagnoses     Need for influenza vaccination  Relevant Orders   Flu Vaccine Trivalent High Dose (Fluad) (Completed)     Assessment and Plan    Postoperative Pain   Following recent right-side surgery, he reports less pain on the right but significant pain on the left, with another surgery scheduled for March 5th. We discussed anticipated outcomes and postoperative pain management. We will continue his current pain management regimen and schedule a follow-up for postoperative evaluation after the next surgery.  Surgical Clearance   He requires surgical clearance for the upcoming surgery on March 5th, as the previous clearance from September is no longer valid. We discussed the need for an EKG and other tests. We will schedule a surgical clearance appointment in January or February and order the necessary blood work for surgical clearance.  Diabetes Mellitus Type 2   His diabetes management has been complicated by a lack of blood sugar readings for ten days due to the unavailability of test strips. We discussed the  potential use of over-the-counter continuous glucose monitors (CGM) like Freestyle and Dexcom to avoid frequent finger pricks. We will prescribe test strips for Accu-Chek and discuss the potential use of CGM in future visits.  Hypertension   His hypertension management is ongoing with no specific issues discussed. We will continue his current antihypertensive medications.  General Health Maintenance   He is due for a flu shot and recently received the COVID-19 vaccine but is overdue for an eye exam, with the last exam in August. We discussed the importance of regular eye exams for diabetes management. We will administer the flu shot and update eye exam records in the system.  Follow-up   We will schedule a follow-up visit six months after the next surgery.        Return in about 6 months (around 11/03/2023) for annual exam, fasting.    [name], DO

## 2023-05-06 NOTE — Assessment & Plan Note (Signed)
Return to office 6 months 'for cpe Return to office 3 month for labs

## 2023-05-06 NOTE — Patient Instructions (Signed)

## 2023-05-09 ENCOUNTER — Other Ambulatory Visit: Payer: Self-pay | Admitting: Family Medicine

## 2023-05-09 ENCOUNTER — Telehealth: Payer: Self-pay | Admitting: Family Medicine

## 2023-05-09 DIAGNOSIS — M25551 Pain in right hip: Secondary | ICD-10-CM

## 2023-05-09 MED ORDER — ACCU-CHEK GUIDE TEST VI STRP: ORAL_STRIP | 12 refills | Status: DC

## 2023-05-09 NOTE — Addendum Note (Signed)
Addended by: Roxanne Gates on: 05/09/2023 02:18 PM   Modules accepted: Orders

## 2023-05-09 NOTE — Telephone Encounter (Signed)
Refills sent

## 2023-05-09 NOTE — Telephone Encounter (Signed)
Patient said we were supposed to refill his test strips. Please send to [facility] on [address] in [place]

## 2023-05-10 NOTE — Telephone Encounter (Signed)
Pt said the wrong test strips were sent in. He needs the one touch ultra test strips for his one touch ultra mini monitor

## 2023-05-11 MED ORDER — GLUCOSE BLOOD VI STRP: ORAL_STRIP | 12 refills | Status: DC

## 2023-05-11 NOTE — Addendum Note (Signed)
Addended byConrad Waverly D on: 05/11/2023 09:32 AM   Modules accepted: Orders

## 2023-05-11 NOTE — Telephone Encounter (Signed)
Rx resent for correct test strips

## 2023-05-18 ENCOUNTER — Telehealth: Payer: Self-pay

## 2023-05-18 NOTE — Telephone Encounter (Signed)
   Pre-operative Risk Assessment    Patient Name: Richard [name]  DOB: 1950/09/03 MRN: [id number]      Request for Surgical Clearance    Procedure:   Left total knee arthroplasty  Date of Surgery:  Clearance 08/17/23                                 Surgeon:  Dr. [name] Surgeon's Group or Practice Name:  [facility] Phone number:  [phone] Fax number:  [phone]   Type of Clearance Requested:   - Medical    Type of Anesthesia:  Not Indicated   Additional requests/questions:    Signed, Eleonore Chiquito   05/18/2023, 10:31 AM

## 2023-05-18 NOTE — Telephone Encounter (Signed)
   Name: Richard [name]  DOB: 03/09/1951  MRN: [id number]  Primary Cardiologist: None   Preoperative team, please contact this patient and set up a phone call appointment for further preoperative risk assessment. Please obtain consent and complete medication review. Thank you for your help.  I confirm that guidance regarding antiplatelet and oral anticoagulation therapy has been completed and, if necessary, noted below.  None requested   I also confirmed the patient resides in the state of [place]. As per [facility] Medical Board telemedicine laws, the patient must reside in the state in which the provider is licensed.   Ronney Asters, NP 05/18/2023, 10:51 AM [facility]

## 2023-05-20 ENCOUNTER — Telehealth: Payer: Self-pay

## 2023-05-20 NOTE — Telephone Encounter (Signed)
  Patient Consent for Virtual Visit        [name] has provided verbal consent on 05/20/2023 for a virtual visit (video or telephone).   CONSENT FOR VIRTUAL VISIT FOR:  Richard [name]  By participating in this virtual visit I agree to the following:  I hereby voluntarily request, consent and authorize [facility] and its employed or contracted physicians, physician assistants, nurse practitioners or other licensed health care professionals (the Practitioner), to provide me with telemedicine health care services (the "Services") as deemed necessary by the treating Practitioner. I acknowledge and consent to receive the Services by the Practitioner via telemedicine. I understand that the telemedicine visit will involve communicating with the Practitioner through live audiovisual communication technology and the disclosure of certain medical information by electronic transmission. I acknowledge that I have been given the opportunity to request an in-person assessment or other available alternative prior to the telemedicine visit and am voluntarily participating in the telemedicine visit.  I understand that I have the right to withhold or withdraw my consent to the use of telemedicine in the course of my care at any time, without affecting my right to future care or treatment, and that the Practitioner or I may terminate the telemedicine visit at any time. I understand that I have the right to inspect all information obtained and/or recorded in the course of the telemedicine visit and may receive copies of available information for a reasonable fee.  I understand that some of the potential risks of receiving the Services via telemedicine include:  Delay or interruption in medical evaluation due to technological equipment failure or disruption; Information transmitted may not be sufficient (e.g. poor resolution of images) to allow for appropriate medical decision making by the  Practitioner; and/or  In rare instances, security protocols could fail, causing a breach of personal health information.  Furthermore, I acknowledge that it is my responsibility to provide information about my medical history, conditions and care that is complete and accurate to the best of my ability. I acknowledge that Practitioner's advice, recommendations, and/or decision may be based on factors not within their control, such as incomplete or inaccurate data provided by me or distortions of diagnostic images or specimens that may result from electronic transmissions. I understand that the practice of medicine is not an exact science and that Practitioner makes no warranties or guarantees regarding treatment outcomes. I acknowledge that a copy of this consent can be made available to me via my patient portal [facility] MyChart), or I can request a printed copy by calling the office of [facility].    I understand that my insurance will be billed for this visit.   I have read or had this consent read to me. I understand the contents of this consent, which adequately explains the benefits and risks of the Services being provided via telemedicine.  I have been provided ample opportunity to ask questions regarding this consent and the Services and have had my questions answered to my satisfaction. I give my informed consent for the services to be provided through the use of telemedicine in my medical care

## 2023-05-20 NOTE — Telephone Encounter (Signed)
Spoke with patient who is agreeable to do a tele visit on 2/3 at 9 am. Consent given, med rec to be completed.

## 2023-05-26 ENCOUNTER — Other Ambulatory Visit: Payer: Self-pay | Admitting: Family Medicine

## 2023-05-26 DIAGNOSIS — M25551 Pain in right hip: Secondary | ICD-10-CM

## 2023-05-27 MED ORDER — OXYCODONE-ACETAMINOPHEN 10-325 MG PO TABS: 1.0000 | ORAL_TABLET | Freq: Three times a day (TID) | ORAL | 0 refills | Status: DC | PRN

## 2023-06-27 ENCOUNTER — Encounter: Payer: Self-pay | Admitting: Family Medicine

## 2023-06-27 ENCOUNTER — Other Ambulatory Visit: Payer: Self-pay | Admitting: Family Medicine

## 2023-06-27 ENCOUNTER — Ambulatory Visit: Payer: 59 | Admitting: Family Medicine

## 2023-06-27 VITALS — BP 160/60 | HR 85 | Temp 97.9°F | Resp 20 | Ht 70.0 in | Wt 289.0 lb

## 2023-06-27 DIAGNOSIS — M1612 Unilateral primary osteoarthritis, left hip: Secondary | ICD-10-CM

## 2023-06-27 DIAGNOSIS — I152 Hypertension secondary to endocrine disorders: Secondary | ICD-10-CM

## 2023-06-27 DIAGNOSIS — E1159 Type 2 diabetes mellitus with other circulatory complications: Secondary | ICD-10-CM

## 2023-06-27 DIAGNOSIS — M25551 Pain in right hip: Secondary | ICD-10-CM

## 2023-06-27 DIAGNOSIS — E785 Hyperlipidemia, unspecified: Secondary | ICD-10-CM | POA: Diagnosis not present

## 2023-06-27 DIAGNOSIS — E1151 Type 2 diabetes mellitus with diabetic peripheral angiopathy without gangrene: Secondary | ICD-10-CM

## 2023-06-27 DIAGNOSIS — Z01818 Encounter for other preprocedural examination: Secondary | ICD-10-CM | POA: Diagnosis not present

## 2023-06-27 MED ORDER — GLUCOSE BLOOD VI STRP: ORAL_STRIP | 12 refills | Status: DC

## 2023-06-27 MED ORDER — OXYCODONE-ACETAMINOPHEN 10-325 MG PO TABS: 1.0000 | ORAL_TABLET | Freq: Three times a day (TID) | ORAL | 0 refills | Status: DC | PRN

## 2023-06-27 NOTE — Telephone Encounter (Signed)
 Requesting: Percocet 10-325 mg  Contract: 12/01/2021 UDS: 12/01/2021 Last Visit: 05/06/2023 Next Visit: N/A Last Refill: 05/27/2023  Please Advise

## 2023-06-27 NOTE — Assessment & Plan Note (Signed)
 Pt to have surgery in March with Dr Despina Hick

## 2023-06-27 NOTE — Progress Notes (Signed)
 Established Patient Office Visit  Subjective   Patient ID: Richard [name], male    DOB: 1950/09/18  Age: 73 y.o. MRN: [id number]  Chief Complaint  Patient presents with   Diabetes   Weight Check    HPI Discussed the use of AI scribe software for clinical note transcription with the patient, who gave verbal consent to proceed.  History of Present Illness   The patient, with a history of obesity, hypertension, and diabetes, presents with concerns about recent weight gain of twenty pounds in forty days following surgery. He expresses difficulty in losing weight and managing his health conditions. He has not been to his regular doctor, Dr. Verdon, since his surgery in September but expresses a desire to return.  The patient reports a mix-up with his diabetes test strips and has been unable to monitor his blood sugar levels. He is due for a refill in February. He uses OneTouch Ultra test strips.  The patient also reports elevated blood pressure and chronic pain, which has been particularly severe for the past three days. This pain has been disrupting his sleep. He is scheduled for a hip replacement in March, but expresses concerns about his weight affecting the procedure.  The patient's medications have been refilled by [facility], but he does not specify which medications these are. He has not seen Dr. Mona recently, but he mentions that he has been cleared by him. He also mentions that he will need another EKG, as the previous one is only valid for three months.  The patient's feet are reported to be okay, but no further details are provided. He has no other questions or concerns at this time.      Patient Active Problem List   Diagnosis Date Noted   Therapeutic opioid-induced constipation (OIC) 05/06/2023   OA (osteoarthritis) of hip 03/14/2023   Osteoarthritis of right hip 03/14/2023   Preventative health care 02/25/2023   Cardiac murmur 02/25/2023   Right bundle branch block  02/23/2023   Pre-op evaluation 02/23/2023   Syncope 02/23/2023   Right shoulder pain 02/25/2022   Acute right otitis media 02/25/2022   Morbid obesity (HCC) 02/25/2022   Type 2 diabetes mellitus with hyperglycemia, without long-term current use of insulin  (HCC) 06/02/2021   Cough 12/30/2020   Controlled type 2 diabetes mellitus with complication, without long-term current use of insulin  (HCC) 02/21/2020   Right hip pain 08/16/2018   Viral upper respiratory tract infection 03/10/2017   Hx of adenomatous colonic polyps    Benign neoplasm of descending colon    Essential hypertension 09/06/2016   Tooth abscess 09/01/2015   Sinusitis 11/08/2014   Sinusitis, acute 10/28/2014   Trigger ring finger of right hand 02/06/2013   Suspicious nevus 02/06/2013   Avulsion of toenail 12/11/2010   Acute sinusitis 08/17/2010   Open wound of finger 07/10/2010   SCIATICA, LEFT 09/02/2009   UNSPECIFIED PERIPHERAL VASCULAR DISEASE 07/04/2009   Hyperlipidemia 05/05/2009   HEARTBURN 04/22/2009   Type 2 diabetes mellitus with diabetic peripheral angiopathy without gangrene (HCC) 01/23/2009   Past Medical History:  Diagnosis Date   Allergy    seasonal   Arthritis     left knee,  right hip   Back pain    Bilateral swelling of feet    Complication of anesthesia    half awake during last colonscopy   Constipation    Diabetes mellitus    type 2   Heart murmur    Hx of adenomatous colonic polyps  2010 6 mm adenoma 10/06/2016 2 5 mm descending polyps    Hyperlipidemia    Hypertension    Joint pain    Lactose intolerance    Past Surgical History:  Procedure Laterality Date   COLONOSCOPY     COLONOSCOPY WITH PROPOFOL  N/A 10/06/2016   Procedure: COLONOSCOPY WITH PROPOFOL ;  Surgeon: Lupita FORBES Commander, MD;  Location: [facility];  Service: Endoscopy;  Laterality: N/A;   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   Procedure: TOTAL HIP ARTHROPLASTY ANTERIOR APPROACH;  Surgeon: Melodi Lerner, MD;   Location: [facility];  Service: Orthopedics;  Laterality: Right;   Social History   Tobacco Use   Smoking status: Never   Smokeless tobacco: Never  Vaping Use   Vaping status: Never Used  Substance Use Topics   Alcohol use: Yes    Alcohol/week: 1.0 standard drink of alcohol    Types: 1 Standard drinks or equivalent per week    Comment: daily   Drug use: No   Social History   Socioeconomic History   Marital status: Married    Spouse name: Not on file   Number of children: Not on file   Years of education: Not on file   Highest education level: Not on file  Occupational History   Not on file  Tobacco Use   Smoking status: Never   Smokeless tobacco: Never  Vaping Use   Vaping status: Never Used  Substance and Sexual Activity   Alcohol use: Yes    Alcohol/week: 1.0 standard drink of alcohol    Types: 1 Standard drinks or equivalent per week    Comment: daily   Drug use: No   Sexual activity: Not on file  Other Topics Concern   Not on file  Social History Narrative   Not on file   Social Drivers of Health   Financial Resource Strain: Not on file  Food Insecurity: No Food Insecurity (03/15/2023)   Hunger Vital Sign    Worried About Running Out of Food in the Last Year: Never true    Ran Out of Food in the Last Year: Never true  Transportation Needs: No Transportation Needs (03/15/2023)   PRAPARE - Administrator, Civil Service (Medical): No    Lack of Transportation (Non-Medical): No  Physical Activity: Not on file  Stress: Not on file  Social Connections: Not on file  Intimate Partner Violence: Not At Risk (03/15/2023)   Humiliation, Afraid, Rape, and Kick questionnaire    Fear of Current or Ex-Partner: No    Emotionally Abused: No    Physically Abused: No    Sexually Abused: No   Family Status  Relation Name Status   Mother  (Not Specified)   Sister  (Not Specified)   Brother  (Not Specified)   Neg Hx  (Not Specified)  No partnership data on file    Family History  Problem Relation Age of Onset   Hyperlipidemia Mother    Hypertension Mother    Diabetes Mother    Diabetes Sister    Hypertension Sister    Stroke Sister    Diabetes Brother    Colon cancer Neg Hx    Colon polyps Neg Hx    Rectal cancer Neg Hx    Stomach cancer Neg Hx    Esophageal cancer Neg Hx    Allergies  Allergen Reactions   Semaglutide  Other (See Comments)    RYBELSUS . Severe headaches.       Review of Systems  Constitutional:  Negative for fever and malaise/fatigue.  HENT:  Negative for congestion.   Eyes:  Negative for blurred vision.  Respiratory:  Negative for cough and shortness of breath.   Cardiovascular:  Negative for chest pain, palpitations and leg swelling.  Gastrointestinal:  Negative for abdominal pain, blood in stool, nausea and vomiting.  Genitourinary:  Negative for dysuria and frequency.  Musculoskeletal:  Positive for back pain and joint pain. Negative for falls.  Skin:  Negative for rash.  Neurological:  Negative for dizziness, loss of consciousness and headaches.  Endo/Heme/Allergies:  Negative for environmental allergies.  Psychiatric/Behavioral:  Negative for depression. The patient is not nervous/anxious.       Objective:     BP (!) 160/60 (BP Location: Left Arm, Patient Position: Sitting, Cuff Size: Large)   Pulse 85   Temp 97.9 F (36.6 C) (Oral)   Resp 20   Ht 5' 10 (1.778 m)   Wt 289 lb (131.1 kg)   SpO2 97%   BMI 41.47 kg/m  BP Readings from Last 3 Encounters:  06/27/23 (!) 160/60  05/06/23 (!) 150/70  03/15/23 (!) 111/52   Wt Readings from Last 3 Encounters:  06/27/23 289 lb (131.1 kg)  05/06/23 285 lb 12.8 oz (129.6 kg)  03/14/23 283 lb (128.4 kg)   SpO2 Readings from Last 3 Encounters:  06/27/23 97%  05/06/23 98%  03/15/23 100%      Physical Exam Vitals and nursing note reviewed.  Constitutional:      General: He is not in acute distress.    Appearance: Normal appearance. He is  well-developed.  HENT:     Head: Normocephalic and atraumatic.  Eyes:     General: No scleral icterus.       Right eye: No discharge.        Left eye: No discharge.  Cardiovascular:     Rate and Rhythm: Normal rate and regular rhythm.     Heart sounds: No murmur heard. Pulmonary:     Effort: Pulmonary effort is normal. No respiratory distress.     Breath sounds: Normal breath sounds.  Musculoskeletal:        General: Normal range of motion.     Cervical back: Normal range of motion and neck supple.     Right lower leg: No edema.     Left lower leg: No edema.  Skin:    General: Skin is warm and dry.  Neurological:     Mental Status: He is alert and oriented to person, place, and time.  Psychiatric:        Mood and Affect: Mood normal.        Behavior: Behavior normal.        Thought Content: Thought content normal.        Judgment: Judgment normal.      No results found for any visits on 06/27/23.    The 10-year ASCVD risk score (Arnett DK, et al., 2019) is: 41.1%    Assessment & Plan:   Problem List Items Addressed This Visit       Unprioritized   Type 2 diabetes mellitus with diabetic peripheral angiopathy without gangrene (HCC)   Relevant Orders   CBC with Differential/Platelet   Comprehensive metabolic panel   Lipid panel   TSH   Morbid obesity (HCC)   Relevant Orders   Amb Ref to Medical Weight Management   Hyperlipidemia   Relevant Orders   CBC with Differential/Platelet   Comprehensive metabolic panel   Lipid panel  TSH   OA (osteoarthritis) of hip   Pt to have surgery in March with Dr Hiram       Other Visit Diagnoses       Pre-op exam    -  Primary   Relevant Orders   EKG 12-Lead (Completed)     Hypertension associated with diabetes (HCC)       Relevant Orders   CBC with Differential/Platelet   Comprehensive metabolic panel   Lipid panel   TSH     Assessment and Plan    Hip Osteoarthritis Significant pain and difficulty  sleeping are present. Hip replacement surgery is scheduled for March 5th, 2025. Weight loss and pre-surgical clearance tests were discussed. Perform an EKG today and ensure pre-surgical clearance with Dr. Theotis. Schedule a follow-up for blood work and EKG results.  Obesity A twenty-pound weight gain occurred in forty days post-surgery. Joint pain limits mobility, complicating weight loss. The importance of weight loss for hip surgery eligibility was discussed. Refer to Dr. Verdon at [facility].  Hypertension Blood pressure is slightly elevated, likely due to pain. Pain management was discussed to help control blood pressure. Monitor blood pressure and address pain management.  Diabetes Mellitus There is an inability to monitor blood glucose due to a test strip mix-up. An alternative glucometer ([employer]) and test strips from [place] were discussed. Send a prescription for OneTouch Ultra test strips and advise on the alternative glucometer and test strips.  General Health Maintenance Ongoing management of chronic conditions and pre-surgical clearance is required. Monitor cholesterol levels and ensure regular follow-ups with primary care and specialists.  Follow-up Follow-up with Dr. Verdon at [facility] and Dr. Theotis for pre-surgical clearance. Schedule a review of EKG and blood work results.        No follow-ups on file.    Nizhoni Parlow R Lowne Chase, DO

## 2023-06-28 LAB — CBC WITH DIFFERENTIAL/PLATELET
Basophils Absolute: 0.1 10*3/uL (ref 0.0–0.1)
Basophils Relative: 1.2 % (ref 0.0–3.0)
Eosinophils Absolute: 0 10*3/uL (ref 0.0–0.7)
Eosinophils Relative: 0.7 % (ref 0.0–5.0)
HCT: 41.4 % (ref 39.0–52.0)
Hemoglobin: 13.8 g/dL (ref 13.0–17.0)
Lymphocytes Relative: 18.4 % (ref 12.0–46.0)
Lymphs Abs: 1.2 10*3/uL (ref 0.7–4.0)
MCHC: 33.4 g/dL (ref 30.0–36.0)
MCV: 100 fL (ref 78.0–100.0)
Monocytes Absolute: 0.7 10*3/uL (ref 0.1–1.0)
Monocytes Relative: 10.8 % (ref 3.0–12.0)
Neutro Abs: 4.6 10*3/uL (ref 1.4–7.7)
Neutrophils Relative %: 68.9 % (ref 43.0–77.0)
Platelets: 307 10*3/uL (ref 150.0–400.0)
RBC: 4.14 Mil/uL — ABNORMAL LOW (ref 4.22–5.81)
RDW: 13.4 % (ref 11.5–15.5)
WBC: 6.7 10*3/uL (ref 4.0–10.5)

## 2023-06-28 LAB — COMPREHENSIVE METABOLIC PANEL
ALT: 9 U/L (ref 0–53)
AST: 12 U/L (ref 0–37)
Albumin: 4.5 g/dL (ref 3.5–5.2)
Alkaline Phosphatase: 60 U/L (ref 39–117)
BUN: 23 mg/dL (ref 6–23)
CO2: 26 meq/L (ref 19–32)
Calcium: 9.6 mg/dL (ref 8.4–10.5)
Chloride: 99 meq/L (ref 96–112)
Creatinine, Ser: 1.05 mg/dL (ref 0.40–1.50)
GFR: 71.95 mL/min (ref >60.00)
Glucose, Bld: 85 mg/dL (ref 70–99)
Potassium: 4.5 meq/L (ref 3.5–5.1)
Sodium: 137 meq/L (ref 135–145)
Total Bilirubin: 0.5 mg/dL (ref 0.2–1.2)
Total Protein: 7.5 g/dL (ref 6.0–8.3)

## 2023-06-28 LAB — LIPID PANEL
Cholesterol: 151 mg/dL (ref 0–200)
HDL: 70.8 mg/dL (ref >39.00)
LDL Cholesterol: 68 mg/dL (ref 0–99)
NonHDL: 80.11
Total CHOL/HDL Ratio: 2
Triglycerides: 63 mg/dL (ref 0.0–149.0)
VLDL: 12.6 mg/dL (ref 0.0–40.0)

## 2023-06-28 LAB — TSH: TSH: 0.51 u[IU]/mL (ref 0.35–5.50)

## 2023-06-30 ENCOUNTER — Telehealth: Payer: Self-pay

## 2023-06-30 ENCOUNTER — Ambulatory Visit (INDEPENDENT_AMBULATORY_CARE_PROVIDER_SITE_OTHER): Payer: 59 | Admitting: Family Medicine

## 2023-06-30 ENCOUNTER — Encounter (INDEPENDENT_AMBULATORY_CARE_PROVIDER_SITE_OTHER): Payer: Self-pay | Admitting: Family Medicine

## 2023-06-30 VITALS — BP 166/64 | HR 75 | Temp 97.9°F | Ht 69.0 in | Wt 283.0 lb

## 2023-06-30 DIAGNOSIS — I152 Hypertension secondary to endocrine disorders: Secondary | ICD-10-CM | POA: Diagnosis not present

## 2023-06-30 DIAGNOSIS — Z7984 Long term (current) use of oral hypoglycemic drugs: Secondary | ICD-10-CM

## 2023-06-30 DIAGNOSIS — E1159 Type 2 diabetes mellitus with other circulatory complications: Secondary | ICD-10-CM | POA: Diagnosis not present

## 2023-06-30 DIAGNOSIS — E669 Obesity, unspecified: Secondary | ICD-10-CM

## 2023-06-30 DIAGNOSIS — Z6841 Body Mass Index (BMI) 40.0 and over, adult: Secondary | ICD-10-CM

## 2023-06-30 DIAGNOSIS — E559 Vitamin D deficiency, unspecified: Secondary | ICD-10-CM | POA: Diagnosis not present

## 2023-06-30 MED ORDER — VITAMIN D (ERGOCALCIFEROL) 1.25 MG (50000 UNIT) PO CAPS: 50000.0000 [IU] | ORAL_CAPSULE | ORAL | 0 refills | Status: AC

## 2023-06-30 NOTE — Assessment & Plan Note (Addendum)
Blood pressure elevated today.  Has been elevated for months.  Patient on Benicar daily (40mg  -25mg ).  No chest pain, chest pressure, or headache.  Will discuss medication changes at next appointment if BP still elevated.  Patient encouraged to monitor BP at home at least 3 times a week and bring readings to next appointment.

## 2023-06-30 NOTE — Telephone Encounter (Signed)
Copied from CRM [id number]. Topic: Clinical - Lab/Test Results >> Jun 30, 2023  9:21 AM Kathryne Eriksson wrote: Reason for CRM: A1C Number >> Jun 30, 2023  9:22 AM Kathryne Eriksson wrote: Patient states he had blood work done on the 13th, but wasn't given the results and wants to know what his hemoglobin A1C number is.

## 2023-06-30 NOTE — Progress Notes (Signed)
   SUBJECTIVE:  Chief Complaint: Obesity  Interim History: Patient here for follow up after his hip replacement.  He is due for getting the other hip replaced and needs to lose weight because of it. Patient mentions he has not followed meal plan as strictly as he was due to recovery from his hip replacement and the holidays.  Wants to come in to be weighed more consistently.   Richard [name] is here to discuss his progress with his obesity treatment plan. He is on the Category 3 Plan and states he is following his eating plan approximately 0 % of the time. He states he is not exercising much.   OBJECTIVE: Visit Diagnoses: Problem List Items Addressed This Visit       Cardiovascular and Mediastinum   Hypertension associated with diabetes (HCC) - Primary   Blood pressure elevated today.  Has been elevated for months.  Patient on Benicar daily (40mg  -25mg ).  No chest pain, chest pressure, or headache.  Will discuss medication changes at next appointment if BP still elevated.  Patient encouraged to monitor BP at home at least 3 times a week and bring readings to next appointment.        Other   Vitamin D deficiency   Discussed importance of vitamin d supplementation.  Vitamin d supplementation has been shown to decrease fatigue, decrease risk of progression to insulin resistance and then prediabetes, decreases risk of falling in older age and can even assist in decreasing depressive symptoms in PTSD.   Prescription for Vitamin D sent in.        Relevant Medications   Vitamin D, Ergocalciferol, (DRISDOL) 1.25 MG (50000 UNIT) CAPS capsule    No data recorded  No data recorded  No data recorded  No data recorded    ASSESSMENT AND PLAN:  Diet: Richard [name] is currently in the action stage of change. As such, his goal is to continue with weight loss efforts. He has agreed to Category 3 Plan.  Exercise: Richard [name] has been instructed to try a geriatric exercise plan for weight loss and overall  health benefits.   Behavior Modification:  We discussed the following Behavioral Modification Strategies today: increasing lean protein intake, increasing vegetables, meal planning and cooking strategies, and better snacking choices. We discussed various medication options to help Richard [name] with his weight loss efforts and we both agreed to get back to original meal plan of Category 3 to increase total intake of calories and protein.  Return in about 6 weeks (around 08/11/2023) for repeat IC.Marland Kitchen He was informed of the importance of frequent follow up visits to maximize his success with intensive lifestyle modifications for his multiple health conditions.  Attestation Statements:   Reviewed by clinician on day of visit: allergies, medications, problem list, medical history, surgical history, family history, social history, and previous encounter notes.     Reuben Likes, MD

## 2023-07-01 NOTE — Telephone Encounter (Signed)
Are able to add a A1c?

## 2023-07-03 ENCOUNTER — Encounter: Payer: Self-pay | Admitting: Family Medicine

## 2023-07-04 DIAGNOSIS — E559 Vitamin D deficiency, unspecified: Secondary | ICD-10-CM | POA: Insufficient documentation

## 2023-07-04 NOTE — Telephone Encounter (Signed)
Not able to add at this time due to stability of specimen.

## 2023-07-04 NOTE — Assessment & Plan Note (Signed)
 Discussed importance of vitamin d supplementation.  Vitamin d supplementation has been shown to decrease fatigue, decrease risk of progression to insulin resistance and then prediabetes, decreases risk of falling in older age and can even assist in decreasing depressive symptoms in PTSD.   Prescription for Vitamin D sent in.

## 2023-07-11 ENCOUNTER — Other Ambulatory Visit: Payer: Self-pay | Admitting: Family Medicine

## 2023-07-11 DIAGNOSIS — M25551 Pain in right hip: Secondary | ICD-10-CM

## 2023-07-12 ENCOUNTER — Telehealth: Payer: Self-pay

## 2023-07-12 NOTE — Telephone Encounter (Signed)
Copied from CRM [id number]. Topic: Clinical - Lab/Test Results >> Jul 12, 2023 10:13 AM Tiffany H wrote: Reason for CRM: Patient called to follow up on Hemagobin A1C test. Patient would like to know if Dr. Zola Button has received results back yet. Please assist.   New order for lab pull may need to be submitted. Test has not been ordered since November. Please follow up. >> Jul 12, 2023 10:22 AM Tiffany H wrote: Advised by CAL to send HP to clinic.

## 2023-07-13 ENCOUNTER — Other Ambulatory Visit: Payer: Self-pay

## 2023-07-13 DIAGNOSIS — E1151 Type 2 diabetes mellitus with diabetic peripheral angiopathy without gangrene: Secondary | ICD-10-CM

## 2023-07-13 NOTE — Telephone Encounter (Signed)
Labs ordered. Pt called and left DVM.Advised patient to schedule lab appointment

## 2023-07-15 ENCOUNTER — Ambulatory Visit: Payer: 59 | Admitting: Family Medicine

## 2023-07-15 ENCOUNTER — Telehealth: Payer: Self-pay

## 2023-07-15 ENCOUNTER — Encounter: Payer: Self-pay | Admitting: Family Medicine

## 2023-07-15 VITALS — BP 170/80 | HR 96 | Temp 98.3°F | Resp 20 | Ht 69.0 in | Wt 288.2 lb

## 2023-07-15 DIAGNOSIS — E559 Vitamin D deficiency, unspecified: Secondary | ICD-10-CM

## 2023-07-15 DIAGNOSIS — E1159 Type 2 diabetes mellitus with other circulatory complications: Secondary | ICD-10-CM

## 2023-07-15 DIAGNOSIS — I152 Hypertension secondary to endocrine disorders: Secondary | ICD-10-CM

## 2023-07-15 DIAGNOSIS — T402X5A Adverse effect of other opioids, initial encounter: Secondary | ICD-10-CM

## 2023-07-15 DIAGNOSIS — E1151 Type 2 diabetes mellitus with diabetic peripheral angiopathy without gangrene: Secondary | ICD-10-CM | POA: Diagnosis not present

## 2023-07-15 DIAGNOSIS — K5903 Drug induced constipation: Secondary | ICD-10-CM

## 2023-07-15 DIAGNOSIS — Z01818 Encounter for other preprocedural examination: Secondary | ICD-10-CM | POA: Diagnosis not present

## 2023-07-15 DIAGNOSIS — E785 Hyperlipidemia, unspecified: Secondary | ICD-10-CM

## 2023-07-15 DIAGNOSIS — Z7984 Long term (current) use of oral hypoglycemic drugs: Secondary | ICD-10-CM

## 2023-07-15 MED ORDER — LUBIPROSTONE 24 MCG PO CAPS: 24.0000 ug | ORAL_CAPSULE | Freq: Two times a day (BID) | ORAL | 5 refills | Status: AC

## 2023-07-15 NOTE — Progress Notes (Signed)
Established Patient Office Visit  Subjective   Patient ID: Richard [name], male    DOB: 05-02-1951  Age: 73 y.o. MRN: [id number]  Chief Complaint  Patient presents with   Follow-up    HPI Discussed the use of AI scribe software for clinical note transcription with the patient, who gave verbal consent to proceed.  History of Present Illness   The patient, with diabetes, presents for pre-surgical clearance and coordination for an upcoming surgery on March 5th.  The patient is preparing for surgery scheduled on March 5th and is inquiring about the necessary pre-surgical tests and paperwork. He is concerned about ensuring all required documentation and tests are completed in a timely manner to avoid any last-minute issues.  Recent cardiac evaluations include an EKG on January 13th and a stress test and echocardiogram in September. He is ensuring that these records are available for the surgical team at [facility].  The patient has diabetes and notes that his hemoglobin A1c was not included in previous tests, which he finds unusual given his condition. He is ensuring that this test is included in the current pre-surgical workup.  There is a discussion about whether a breathing test is needed. A representative from [facility] suggested it, but he is unsure if it is necessary. He does not use a CPAP machine and has no known respiratory issues that would typically require such a test.  He is coordinating blood work to be within the 30-day requirement for [facility], planning to have it done on February 20th to meet the criteria for his surgery on March 5th.      Patient Active Problem List   Diagnosis Date Noted   Vitamin D deficiency 07/04/2023   Therapeutic opioid-induced constipation (OIC) 05/06/2023   OA (osteoarthritis) of hip 03/14/2023   Osteoarthritis of right hip 03/14/2023   Preventative health care 02/25/2023   Cardiac murmur 02/25/2023    Right bundle branch block 02/23/2023   Pre-op evaluation 02/23/2023   Syncope 02/23/2023   Right shoulder pain 02/25/2022   Acute right otitis media 02/25/2022   Morbid obesity (HCC) 02/25/2022   Type 2 diabetes mellitus with hyperglycemia, without long-term current use of insulin (HCC) 06/02/2021   Cough 12/30/2020   Controlled type 2 diabetes mellitus with complication, without long-term current use of insulin (HCC) 02/21/2020   Right hip pain 08/16/2018   Viral upper respiratory tract infection 03/10/2017   Hx of adenomatous colonic polyps    Benign neoplasm of descending colon    Hypertension associated with diabetes (HCC) 09/06/2016   Tooth abscess 09/01/2015   Sinusitis 11/08/2014   Sinusitis, acute 10/28/2014   Trigger ring finger of right hand 02/06/2013   Suspicious nevus 02/06/2013   Avulsion of toenail 12/11/2010   Acute sinusitis 08/17/2010   Open wound of finger 07/10/2010   SCIATICA, LEFT 09/02/2009   UNSPECIFIED PERIPHERAL VASCULAR DISEASE 07/04/2009   Hyperlipidemia 05/05/2009   HEARTBURN 04/22/2009   Type 2 diabetes mellitus with diabetic peripheral angiopathy without gangrene (HCC) 01/23/2009   Past Medical History:  Diagnosis Date   Allergy    seasonal   Arthritis     left knee,  right hip   Back pain    Bilateral swelling of feet    Complication of anesthesia    half awake during last colonscopy   Constipation    Diabetes mellitus    type 2   Heart murmur    Hx of adenomatous colonic polyps  2010 6 mm adenoma 10/06/2016 2 5 mm descending polyps    Hyperlipidemia    Hypertension    Joint pain    Lactose intolerance    Past Surgical History:  Procedure Laterality Date   COLONOSCOPY     COLONOSCOPY WITH PROPOFOL N/A 10/06/2016   Procedure: COLONOSCOPY WITH PROPOFOL;  Surgeon: [name], MD;  Location: [facility];  Service: Endoscopy;  Laterality: N/A;   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   Procedure: TOTAL HIP ARTHROPLASTY ANTERIOR  APPROACH;  Surgeon: [name], MD;  Location: [facility];  Service: Orthopedics;  Laterality: Right;   Social History   Tobacco Use   Smoking status: Never   Smokeless tobacco: Never  Vaping Use   Vaping status: Never Used  Substance Use Topics   Alcohol use: Yes    Alcohol/week: 1.0 standard drink of alcohol    Types: 1 Standard drinks or equivalent per week    Comment: daily   Drug use: No   Social History   Socioeconomic History   Marital status: Married    Spouse name: Not on file   Number of children: Not on file   Years of education: Not on file   Highest education level: Not on file  Occupational History   Not on file  Tobacco Use   Smoking status: Never   Smokeless tobacco: Never  Vaping Use   Vaping status: Never Used  Substance and Sexual Activity   Alcohol use: Yes    Alcohol/week: 1.0 standard drink of alcohol    Types: 1 Standard drinks or equivalent per week    Comment: daily   Drug use: No   Sexual activity: Not on file  Other Topics Concern   Not on file  Social History Narrative   Not on file   Social Drivers of Health   Financial Resource Strain: Not on file  Food Insecurity: No Food Insecurity (03/15/2023)   Hunger Vital Sign    Worried About Running Out of Food in the Last Year: Never true    Ran Out of Food in the Last Year: Never true  Transportation Needs: No Transportation Needs (03/15/2023)   PRAPARE - Administrator, Civil Service (Medical): No    Lack of Transportation (Non-Medical): No  Physical Activity: Not on file  Stress: Not on file  Social Connections: Not on file  Intimate Partner Violence: Not At Risk (03/15/2023)   Humiliation, Afraid, Rape, and Kick questionnaire    Fear of Current or Ex-Partner: No    Emotionally Abused: No    Physically Abused: No    Sexually Abused: No   Family Status  Relation Name Status   Mother  (Not Specified)   Sister  (Not Specified)   Brother  (Not Specified)   Neg Hx  (Not  Specified)  No partnership data on file   Family History  Problem Relation Age of Onset   Hyperlipidemia Mother    Hypertension Mother    Diabetes Mother    Diabetes Sister    Hypertension Sister    Stroke Sister    Diabetes Brother    Colon cancer Neg Hx    Colon polyps Neg Hx    Rectal cancer Neg Hx    Stomach cancer Neg Hx    Esophageal cancer Neg Hx    Allergies  Allergen Reactions   Semaglutide Other (See Comments)    RYBELSUS. Severe headaches.       Review of Systems  Constitutional:  Negative for fever and malaise/fatigue.  HENT:  Negative for congestion.   Eyes:  Negative for blurred vision.  Respiratory:  Negative for cough and shortness of breath.   Cardiovascular:  Negative for chest pain, palpitations and leg swelling.  Gastrointestinal:  Negative for vomiting.  Musculoskeletal:  Positive for back pain.       Hip pain   Skin:  Negative for rash.  Neurological:  Negative for loss of consciousness and headaches.      Objective:     BP (!) 170/80 (BP Location: Left Arm, Patient Position: Sitting, Cuff Size: Large)   Pulse 96   Temp 98.3 F (36.8 C) (Oral)   Resp 20   Ht 5\' 9"  (1.753 m)   Wt 288 lb 3.2 oz (130.7 kg)   SpO2 97%   BMI 42.56 kg/m  BP Readings from Last 3 Encounters:  07/15/23 (!) 170/80  06/30/23 (!) 166/64  06/27/23 (!) 160/60   Wt Readings from Last 3 Encounters:  07/15/23 288 lb 3.2 oz (130.7 kg)  06/30/23 283 lb (128.4 kg)  06/27/23 289 lb (131.1 kg)   SpO2 Readings from Last 3 Encounters:  07/15/23 97%  06/30/23 100%  06/27/23 97%      Physical Exam Vitals and nursing note reviewed.  Constitutional:      General: He is not in acute distress.    Appearance: Normal appearance. He is well-developed.  HENT:     Head: Normocephalic and atraumatic.  Eyes:     General: No scleral icterus.       Right eye: No discharge.        Left eye: No discharge.  Cardiovascular:     Rate and Rhythm: Normal rate and regular  rhythm.     Heart sounds: No murmur heard. Pulmonary:     Effort: Pulmonary effort is normal. No respiratory distress.     Breath sounds: Normal breath sounds.  Musculoskeletal:        General: Tenderness present. Normal range of motion.     Cervical back: Normal range of motion and neck supple.     Right lower leg: No edema.     Left lower leg: No edema.     Comments: Hip pain  Skin:    General: Skin is warm and dry.  Neurological:     Mental Status: He is alert and oriented to person, place, and time.  Psychiatric:        Mood and Affect: Mood normal.        Behavior: Behavior normal.        Thought Content: Thought content normal.        Judgment: Judgment normal.      No results found for any visits on 07/15/23.  Last CBC Lab Results  Component Value Date   WBC 6.7 06/27/2023   HGB 13.8 06/27/2023   HCT 41.4 06/27/2023   MCV 100.0 06/27/2023   MCH 33.6 03/15/2023   RDW 13.4 06/27/2023   PLT 307.0 06/27/2023   Last metabolic panel Lab Results  Component Value Date   GLUCOSE 85 06/27/2023   NA 137 06/27/2023   K 4.5 06/27/2023   CL 99 06/27/2023   CO2 26 06/27/2023   BUN 23 06/27/2023   CREATININE 1.05 06/27/2023   GFR 71.95 06/27/2023   CALCIUM 9.6 06/27/2023   PROT 7.5 06/27/2023   ALBUMIN 4.5 06/27/2023   BILITOT 0.5 06/27/2023   ALKPHOS 60 06/27/2023   AST 12 06/27/2023   ALT 9  06/27/2023   ANIONGAP 9 03/15/2023   Last lipids Lab Results  Component Value Date   CHOL 151 06/27/2023   HDL 70.80 06/27/2023   LDLCALC 68 06/27/2023   LDLDIRECT 133.0 12/05/2017   TRIG 63.0 06/27/2023   CHOLHDL 2 06/27/2023   Last hemoglobin A1c Lab Results  Component Value Date   HGBA1C 6.3 02/22/2023   Last thyroid functions Lab Results  Component Value Date   TSH 0.51 06/27/2023   T3TOTAL 90 06/22/2022   Last vitamin D Lab Results  Component Value Date   VD25OH 39.18 11/30/2022   Last vitamin B12 and Folate Lab Results  Component Value Date    VITAMINB12 121 (L) 11/30/2022   FOLATE 9.6 06/22/2022      The 10-year ASCVD risk score (Arnett DK, et al., 2019) is: 43.5%    Assessment & Plan:   Problem List Items Addressed This Visit       Unprioritized   Type 2 diabetes mellitus with diabetic peripheral angiopathy without gangrene (HCC) - Primary   Relevant Medications   lubiprostone (AMITIZA) 24 MCG capsule   Other Relevant Orders   CBC with Differential/Platelet   Comprehensive metabolic panel   Hemoglobin A1c   Lipid panel   TSH   Morbid obesity (HCC)   Relevant Medications   lubiprostone (AMITIZA) 24 MCG capsule   Other Relevant Orders   CBC with Differential/Platelet   Comprehensive metabolic panel   Hemoglobin A1c   Lipid panel   TSH   Pre-op evaluation   Relevant Orders   Protime-INR   PTT   Hyperlipidemia   Relevant Medications   lubiprostone (AMITIZA) 24 MCG capsule   Other Relevant Orders   CBC with Differential/Platelet   Comprehensive metabolic panel   Hemoglobin A1c   Lipid panel   TSH   Therapeutic opioid-induced constipation (OIC)   Vitamin D deficiency   Relevant Medications   lubiprostone (AMITIZA) 24 MCG capsule   Other Relevant Orders   CBC with Differential/Platelet   Comprehensive metabolic panel   Hemoglobin A1c   Lipid panel   TSH   Hypertension associated with diabetes (HCC)   Relevant Medications   lubiprostone (AMITIZA) 24 MCG capsule   Other Relevant Orders   CBC with Differential/Platelet   Comprehensive metabolic panel   Hemoglobin A1c   Lipid panel   TSH  Assessment and Plan    Preoperative Evaluation for Surgery Scheduled for surgery on March 5th, preoperative clearance is required. Recent EKG and stress tests show no significant respiratory issues, eliminating the need for additional pulmonary tests. Blood work must be completed within 30 days of surgery, with an appointment set for February 20th. All preoperative paperwork and test results will be sent to the  surgical team. Complete and send all preoperative paperwork, order blood work for February 20th, and fax EKG and other relevant test results to the surgical team.  Diabetes Mellitus Diabetes management requires attention as the hemoglobin A1c test was not included in previous preoperative testing. The importance of including hemoglobin A1c in preoperative blood work was discussed. Order the hemoglobin A1c test as part of the preoperative blood work.  General Health Maintenance Routine preoperative evaluation includes reviewing recent cardiac evaluations, such as EKG and stress tests, which are available. Review and print copies of recent EKG and stress test results for preoperative clearance.  Follow-up A follow-up appointment is scheduled for February 20th for preoperative blood work and discussion.        Return in about 6  months (around 01/12/2024), or if symptoms worsen or fail to improve.    [name], DO

## 2023-07-15 NOTE — Progress Notes (Deleted)
   Established Patient Office Visit  Subjective   Patient ID: Richard [name], male    DOB: May 31, 1951  Age: 73 y.o. MRN: [id number]  Chief Complaint  Patient presents with  . Follow-up    HPI  {History (Optional):23778}  ROS    Objective:     BP (!) 170/80 (BP Location: Left Arm, Patient Position: Sitting, Cuff Size: Large)   Pulse 96   Temp 98.3 F (36.8 C) (Oral)   Resp 20   Ht 5\' 9"  (1.753 m)   Wt 288 lb 3.2 oz (130.7 kg)   SpO2 97%   BMI 42.56 kg/m  {Vitals History (Optional):23777}  Physical Exam   No results found for any visits on 07/15/23.  {Labs (Optional):23779}  The 10-year ASCVD risk score (Arnett DK, et al., 2019) is: 43.5%    Assessment & Plan:   Problem List Items Addressed This Visit       Unprioritized   Therapeutic opioid-induced constipation (OIC)    No follow-ups on file.    [name], DO

## 2023-07-15 NOTE — Telephone Encounter (Signed)
Called [facility] to see if they received pt pre-op forms but was on hold for 10 mins, Was unable to confirm

## 2023-07-16 LAB — CBC WITH DIFFERENTIAL/PLATELET
Absolute Lymphocytes: 1014 {cells}/uL (ref 850–3900)
Absolute Monocytes: 684 {cells}/uL (ref 200–950)
Basophils Absolute: 18 {cells}/uL (ref 0–200)
Basophils Relative: 0.3 %
Eosinophils Absolute: 42 {cells}/uL (ref 15–500)
Eosinophils Relative: 0.7 %
HCT: 40.1 % (ref 38.5–50.0)
Hemoglobin: 13.5 g/dL (ref 13.2–17.1)
MCH: 32.5 pg (ref 27.0–33.0)
MCHC: 33.7 g/dL (ref 32.0–36.0)
MCV: 96.6 fL (ref 80.0–100.0)
MPV: 10.4 fL (ref 7.5–12.5)
Monocytes Relative: 11.4 %
Neutro Abs: 4242 {cells}/uL (ref 1500–7800)
Neutrophils Relative %: 70.7 %
Platelets: 289 10*3/uL (ref 140–400)
RBC: 4.15 10*6/uL — ABNORMAL LOW (ref 4.20–5.80)
RDW: 11.9 % (ref 11.0–15.0)
Total Lymphocyte: 16.9 %
WBC: 6 10*3/uL (ref 3.8–10.8)

## 2023-07-16 LAB — COMPREHENSIVE METABOLIC PANEL
AG Ratio: 1.5 (calc) (ref 1.0–2.5)
ALT: 10 U/L (ref 9–46)
AST: 14 U/L (ref 10–35)
Albumin: 4.4 g/dL (ref 3.6–5.1)
Alkaline phosphatase (APISO): 59 U/L (ref 35–144)
BUN: 16 mg/dL (ref 7–25)
CO2: 26 mmol/L (ref 20–32)
Calcium: 9.9 mg/dL (ref 8.6–10.3)
Chloride: 99 mmol/L (ref 98–110)
Creat: 1.02 mg/dL (ref 0.70–1.28)
Globulin: 3 g/dL (ref 1.9–3.7)
Glucose, Bld: 98 mg/dL (ref 65–99)
Potassium: 4.4 mmol/L (ref 3.5–5.3)
Sodium: 137 mmol/L (ref 135–146)
Total Bilirubin: 0.8 mg/dL (ref 0.2–1.2)
Total Protein: 7.4 g/dL (ref 6.1–8.1)

## 2023-07-16 LAB — HEMOGLOBIN A1C
Hgb A1c MFr Bld: 5.9 %{Hb} — ABNORMAL HIGH (ref <5.7)
Mean Plasma Glucose: 123 mg/dL
eAG (mmol/L): 6.8 mmol/L

## 2023-07-16 LAB — LIPID PANEL
Cholesterol: 132 mg/dL (ref <200)
HDL: 79 mg/dL (ref >=40)
LDL Cholesterol (Calc): 38 mg/dL
Non-HDL Cholesterol (Calc): 53 mg/dL (ref <130)
Total CHOL/HDL Ratio: 1.7 (calc) (ref <5.0)
Triglycerides: 69 mg/dL (ref <150)

## 2023-07-16 LAB — PROTIME-INR
INR: 1
Prothrombin Time: 10.9 s (ref 9.0–11.5)

## 2023-07-16 LAB — APTT: aPTT: 28 s (ref 23–32)

## 2023-07-16 LAB — TSH: TSH: 0.41 m[IU]/L (ref 0.40–4.50)

## 2023-07-18 ENCOUNTER — Ambulatory Visit: Payer: 59 | Attending: Cardiovascular Disease

## 2023-07-18 DIAGNOSIS — Z01810 Encounter for preprocedural cardiovascular examination: Secondary | ICD-10-CM

## 2023-07-18 NOTE — Progress Notes (Signed)
Virtual Visit via Telephone Note   Because of Richard [name]'s co-morbid illnesses, he is at least at moderate risk for complications without adequate follow up.  This format is felt to be most appropriate for this patient at this time.  The patient did not have access to video technology/had technical difficulties with video requiring transitioning to audio format only (telephone).  All issues noted in this document were discussed and addressed.  No physical exam could be performed with this format.  Please refer to the patient's chart for his consent to telehealth for [facility].  Evaluation Performed:  Preoperative cardiovascular risk assessment _____________   Date:  07/18/2023   Patient ID:  Richard [name], DOB March 08, 1951, MRN [id number] Patient Location:  Home Provider location:   Office  Primary Care Provider:  Zola Button, Grayling Congress, DO Primary Cardiologist:  None  Chief Complaint / Patient Profile   73 y.o. y/o male with a h/o hyperlipidemia, right bundle branch block, cardiac murmur who is pending left total hip arthroplasty and presents today for telephonic preoperative cardiovascular risk assessment.  History of Present Illness    Richard [name] is a 73 y.o. male who presents via audio/video conferencing for a telehealth visit today.  Pt was last seen in cardiology clinic on 02/25/2023 by [name].  At that time [name] was doing well.  He underwent stress testing and echocardiogram which showed reassuring results..  The patient is now pending procedure as outlined above. Since his last visit, he he remained stable from a cardiac standpoint.  Today he denies chest pain, shortness of breath, lower extremity edema, fatigue, palpitations, melena, hematuria, hemoptysis, diaphoresis, weakness, presyncope, syncope, orthopnea, and PND.   Past Medical History    Past Medical History:  Diagnosis Date   Allergy    seasonal   Arthritis     left knee,   right hip   Back pain    Bilateral swelling of feet    Complication of anesthesia    half awake during last colonscopy   Constipation    Diabetes mellitus    type 2   Heart murmur    Hx of adenomatous colonic polyps    2010 6 mm adenoma 10/06/2016 2 5 mm descending polyps    Hyperlipidemia    Hypertension    Joint pain    Lactose intolerance    Past Surgical History:  Procedure Laterality Date   COLONOSCOPY     COLONOSCOPY WITH PROPOFOL N/A 10/06/2016   Procedure: COLONOSCOPY WITH PROPOFOL;  Surgeon: [name], MD;  Location: [facility];  Service: Endoscopy;  Laterality: N/A;   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   Procedure: TOTAL HIP ARTHROPLASTY ANTERIOR APPROACH;  Surgeon: [name], MD;  Location: [facility];  Service: Orthopedics;  Laterality: Right;    Allergies  Allergies  Allergen Reactions   Semaglutide Other (See Comments)    RYBELSUS. Severe headaches.     Home Medications    Prior to Admission medications   Medication Sig Start Date End Date Taking? Authorizing Provider  Accu-Chek Softclix Lancets lancets Check blood sugars once daily 12/01/21   Zola Button, Grayling Congress, DO  acetaminophen (TYLENOL) 500 MG tablet Take 500-1,000 mg by mouth every 6 (six) hours as needed for mild pain or moderate pain (pain.).    [provider]  aspirin 81 MG chewable tablet Chew 1 tablet (81 mg total) by mouth 2 (two) times daily for 20 days. Then take one 81 mg aspirin once  a day for three weeks. Then discontinue aspirin. Patient not taking: Reported on 07/15/2023 03/15/23   [name], [name], PA  atorvastatin (LIPITOR) 40 MG tablet Take 1 tablet (40 mg total) by mouth daily. 05/06/23   [name], DO  Blood Glucose Monitoring Suppl (ACCU-CHEK GUIDE ME) w/Device KIT Check blood sugars once daily 05/06/23   Zola Button, Grayling Congress, DO  dapagliflozin propanediol (FARXIGA) 5 MG TABS tablet Take 1 tablet (5 mg total) by mouth daily. 05/06/23   [name], DO  diltiazem (CARDIZEM CD) 180 MG 24 hr capsule Take 1 capsule (180 mg total) by mouth daily. 05/06/23   [name], DO  glucose blood (ONETOUCH ULTRA) test strip Use to check sugars once daily 06/27/23   Zola Button, Myrene Buddy R, DO  glucose blood test strip Check blood sugars once daily 05/11/23   Zola Button, Grayling Congress, DO  Insulin Pen Needle (B-D UF III MINI PEN NEEDLES) 31G X 5 MM MISC USE AS DIRECTED 05/06/23   Zola Button, Grayling Congress, DO  lubiprostone (AMITIZA) 24 MCG capsule Take 1 capsule (24 mcg total) by mouth 2 (two) times daily with a meal. 07/15/23   Zola Button, Grayling Congress, DO  metFORMIN (GLUCOPHAGE) 500 MG tablet Take 2 tablets (1,000 mg total) by mouth 2 (two) times daily with a meal. 05/06/23   Zola Button, Grayling Congress, DO  olmesartan-hydrochlorothiazide (BENICAR HCT) 40-25 MG tablet Take 1 tablet by mouth daily. 05/06/23   [name], DO  ondansetron (ZOFRAN) 4 MG tablet Take 1 tablet (4 mg total) by mouth every 6 (six) hours as needed for nausea. 03/15/23   [name], [name], PA  oxyCODONE-acetaminophen (PERCOCET) 10-325 MG tablet Take 1 tablet by mouth every 8 (eight) hours as needed for pain. 06/27/23   [name], DO  tiZANidine (ZANAFLEX) 4 MG tablet TAKE 1 TABLET BY MOUTH EVERY 6 HOURS AS NEEDED FOR MUSCLE SPASMS 07/11/23   Zola Button, Grayling Congress, DO  traMADol (ULTRAM) 50 MG tablet Take 1-2 tablets (50-100 mg total) by mouth every 6 (six) hours as needed for moderate pain. 03/15/23   [name], [name], PA  triamcinolone cream (KENALOG) 0.1 % Apply 1 Application topically 2 (two) times daily. 11/30/22   [name], DO  Vitamin D, Ergocalciferol, (DRISDOL) 1.25 MG (50000 UNIT) CAPS capsule Take 1 capsule (50,000 Units total) by mouth every 7 (seven) days. 06/30/23   [name], MD    Physical Exam    Vital Signs:  Maddex Garlitz does not have vital signs available for review today.  Given telephonic nature of communication,  physical exam is limited. AAOx3. NAD. Normal affect.  Speech and respirations are unlabored.  Accessory Clinical Findings    None  Assessment & Plan    1.  Preoperative Cardiovascular Risk Assessment:Procedure:   left total hip arthroplasty   Date of Surgery:  Clearance 08/17/23                                 Surgeon:  Dr. [name] Surgeon's Group or Practice Name:  [facility] Phone number:  [phone] Fax number:  [phone]      Primary Cardiologist: [name]  Chart reviewed as part of pre-operative protocol coverage. Given past medical history and time since last visit, based on ACC/AHA guidelines, Ihan Pat would be at acceptable risk for the planned procedure  without further cardiovascular testing.   His RCRI is low risk, 0.9% of major cardiac event.  He is able to complete greater than 4 METS of physical activity.  Patient was advised that if he develops new symptoms prior to surgery to contact our office to arrange a follow-up appointment.  He verbalized understanding.  I will route this recommendation to the requesting party via Epic fax function and remove from pre-op pool.       Time:   Today, I have spent 5 minutes with the patient with telehealth technology discussing medical history, symptoms, and management plan.     Ronney Asters, NP  07/18/2023, 7:12 AM

## 2023-07-19 ENCOUNTER — Encounter: Payer: Self-pay | Admitting: Family Medicine

## 2023-07-26 ENCOUNTER — Other Ambulatory Visit: Payer: Self-pay | Admitting: Family Medicine

## 2023-07-26 DIAGNOSIS — M25551 Pain in right hip: Secondary | ICD-10-CM

## 2023-07-26 NOTE — H&P (Signed)
TOTAL HIP ADMISSION H&P  Patient is admitted for left total hip arthroplasty.  Subjective:  Chief Complaint: Left hip pain  HPI: Richard [name], 73 y.o. male, has a history of pain and functional disability in the left hip due to arthritis and patient has failed non-surgical conservative treatments for greater than 12 weeks to include NSAID's and/or analgesics, corticosteriod injections, use of assistive devices, and activity modification. Onset of symptoms was gradual, starting  several  years ago with gradually worsening course since that time. The patient noted no past surgery on the left hip. Patient currently rates pain in the left hip at 8 out of 10 with activity. Patient has night pain, worsening of pain with activity and weight bearing, and trendelenberg gait. Patient has evidence of  bone-on-bone with subchondral cysts  by imaging studies. This condition presents safety issues increasing the risk of falls. There is no current active infection.  Patient Active Problem List   Diagnosis Date Noted   Vitamin D deficiency 07/04/2023   Therapeutic opioid-induced constipation (OIC) 05/06/2023   OA (osteoarthritis) of hip 03/14/2023   Osteoarthritis of right hip 03/14/2023   Preventative health care 02/25/2023   Cardiac murmur 02/25/2023   Right bundle branch block 02/23/2023   Pre-op evaluation 02/23/2023   Syncope 02/23/2023   Right shoulder pain 02/25/2022   Acute right otitis media 02/25/2022   Morbid obesity (HCC) 02/25/2022   Type 2 diabetes mellitus with hyperglycemia, without long-term current use of insulin (HCC) 06/02/2021   Cough 12/30/2020   Controlled type 2 diabetes mellitus with complication, without long-term current use of insulin (HCC) 02/21/2020   Right hip pain 08/16/2018   Viral upper respiratory tract infection 03/10/2017   Hx of adenomatous colonic polyps    Benign neoplasm of descending colon    Hypertension associated with diabetes (HCC) 09/06/2016   Tooth  abscess 09/01/2015   Sinusitis 11/08/2014   Sinusitis, acute 10/28/2014   Trigger ring finger of right hand 02/06/2013   Suspicious nevus 02/06/2013   Avulsion of toenail 12/11/2010   Acute sinusitis 08/17/2010   Open wound of finger 07/10/2010   SCIATICA, LEFT 09/02/2009   UNSPECIFIED PERIPHERAL VASCULAR DISEASE 07/04/2009   Hyperlipidemia 05/05/2009   HEARTBURN 04/22/2009   Type 2 diabetes mellitus with diabetic peripheral angiopathy without gangrene (HCC) 01/23/2009    Past Medical History:  Diagnosis Date   Allergy    seasonal   Arthritis     left knee,  right hip   Back pain    Bilateral swelling of feet    Complication of anesthesia    half awake during last colonscopy   Constipation    Diabetes mellitus    type 2   Heart murmur    Hx of adenomatous colonic polyps    2010 6 mm adenoma 10/06/2016 2 5 mm descending polyps    Hyperlipidemia    Hypertension    Joint pain    Lactose intolerance     Past Surgical History:  Procedure Laterality Date   COLONOSCOPY     COLONOSCOPY WITH PROPOFOL N/A 10/06/2016   Procedure: COLONOSCOPY WITH PROPOFOL;  Surgeon: [name], MD;  Location: [facility];  Service: Endoscopy;  Laterality: N/A;   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   Procedure: TOTAL HIP ARTHROPLASTY ANTERIOR APPROACH;  Surgeon: [name], MD;  Location: [facility];  Service: Orthopedics;  Laterality: Right;    Prior to Admission medications   Medication Sig Start Date End Date Taking? Authorizing Provider  Accu-Chek Softclix  Lancets lancets Check blood sugars once daily 12/01/21   Zola Button, Grayling Congress, DO  acetaminophen (TYLENOL) 500 MG tablet Take 500-1,000 mg by mouth every 6 (six) hours as needed for mild pain or moderate pain (pain.).    [provider]  aspirin 81 MG chewable tablet Chew 1 tablet (81 mg total) by mouth 2 (two) times daily for 20 days. Then take one 81 mg aspirin once a day for three weeks. Then discontinue aspirin. Patient  not taking: Reported on 07/15/2023 03/15/23   [name], [name], PA  atorvastatin (LIPITOR) 40 MG tablet Take 1 tablet (40 mg total) by mouth daily. 05/06/23   [name], DO  Blood Glucose Monitoring Suppl (ACCU-CHEK GUIDE ME) w/Device KIT Check blood sugars once daily 05/06/23   Zola Button, Grayling Congress, DO  dapagliflozin propanediol (FARXIGA) 5 MG TABS tablet Take 1 tablet (5 mg total) by mouth daily. 05/06/23   [name], DO  diltiazem (CARDIZEM CD) 180 MG 24 hr capsule Take 1 capsule (180 mg total) by mouth daily. 05/06/23   [name], DO  glucose blood (ONETOUCH ULTRA) test strip Use to check sugars once daily 06/27/23   Zola Button, Myrene Buddy R, DO  glucose blood test strip Check blood sugars once daily 05/11/23   Zola Button, Grayling Congress, DO  Insulin Pen Needle (B-D UF III MINI PEN NEEDLES) 31G X 5 MM MISC USE AS DIRECTED 05/06/23   Zola Button, Grayling Congress, DO  lubiprostone (AMITIZA) 24 MCG capsule Take 1 capsule (24 mcg total) by mouth 2 (two) times daily with a meal. 07/15/23   Zola Button, Grayling Congress, DO  metFORMIN (GLUCOPHAGE) 500 MG tablet Take 2 tablets (1,000 mg total) by mouth 2 (two) times daily with a meal. 05/06/23   Zola Button, Grayling Congress, DO  olmesartan-hydrochlorothiazide (BENICAR HCT) 40-25 MG tablet Take 1 tablet by mouth daily. 05/06/23   [name], DO  ondansetron (ZOFRAN) 4 MG tablet Take 1 tablet (4 mg total) by mouth every 6 (six) hours as needed for nausea. 03/15/23   [name], [name], PA  oxyCODONE-acetaminophen (PERCOCET) 10-325 MG tablet Take 1 tablet by mouth every 8 (eight) hours as needed for pain. 06/27/23   [name], DO  tiZANidine (ZANAFLEX) 4 MG tablet TAKE 1 TABLET BY MOUTH EVERY 6 HOURS AS NEEDED FOR MUSCLE SPASMS 07/11/23   Zola Button, Grayling Congress, DO  traMADol (ULTRAM) 50 MG tablet Take 1-2 tablets (50-100 mg total) by mouth every 6 (six) hours as needed for moderate pain. 03/15/23   [name], [name], PA   triamcinolone cream (KENALOG) 0.1 % Apply 1 Application topically 2 (two) times daily. 11/30/22   [name], DO  Vitamin D, Ergocalciferol, (DRISDOL) 1.25 MG (50000 UNIT) CAPS capsule Take 1 capsule (50,000 Units total) by mouth every 7 (seven) days. 06/30/23   [name], MD    Allergies  Allergen Reactions   Semaglutide Other (See Comments)    RYBELSUS. Severe headaches.     Social History   Socioeconomic History   Marital status: Married    Spouse name: Not on file   Number of children: Not on file   Years of education: Not on file   Highest education level: Not on file  Occupational History   Not on file  Tobacco Use   Smoking status: Never   Smokeless tobacco: Never  Vaping Use   Vaping status: Never Used  Substance  and Sexual Activity   Alcohol use: Yes    Alcohol/week: 1.0 standard drink of alcohol    Types: 1 Standard drinks or equivalent per week    Comment: daily   Drug use: No   Sexual activity: Not on file  Other Topics Concern   Not on file  Social History Narrative   Not on file   Social Drivers of Health   Financial Resource Strain: Not on file  Food Insecurity: No Food Insecurity (03/15/2023)   Hunger Vital Sign    Worried About Running Out of Food in the Last Year: Never true    Ran Out of Food in the Last Year: Never true  Transportation Needs: No Transportation Needs (03/15/2023)   PRAPARE - Administrator, Civil Service (Medical): No    Lack of Transportation (Non-Medical): No  Physical Activity: Not on file  Stress: Not on file  Social Connections: Not on file  Intimate Partner Violence: Not At Risk (03/15/2023)   Humiliation, Afraid, Rape, and Kick questionnaire    Fear of Current or Ex-Partner: No    Emotionally Abused: No    Physically Abused: No    Sexually Abused: No    Tobacco Use: Low Risk  (07/18/2023)   Patient History    Smoking Tobacco Use: Never    Smokeless Tobacco Use: Never    Passive  Exposure: Not on file   Social History   Substance and Sexual Activity  Alcohol Use Yes   Alcohol/week: 1.0 standard drink of alcohol   Types: 1 Standard drinks or equivalent per week   Comment: daily    Family History  Problem Relation Age of Onset   Hyperlipidemia Mother    Hypertension Mother    Diabetes Mother    Diabetes Sister    Hypertension Sister    Stroke Sister    Diabetes Brother    Colon cancer Neg Hx    Colon polyps Neg Hx    Rectal cancer Neg Hx    Stomach cancer Neg Hx    Esophageal cancer Neg Hx     ROS   Objective:  Physical Exam: Well nourished and well developed.  General: Alert and oriented x3, cooperative and pleasant, no acute distress.  Head: normocephalic, atraumatic, neck supple.  Eyes: EOMI.  Abdomen: non-tender to palpation and soft, normoactive bowel sounds. Musculoskeletal: - Left hip flexion 110, rotation in 10 out of 30, abduction 30.  - No trochanteric tenderness.  Calves soft and nontender. Motor function intact in LE. Strength 5/5 LE bilaterally. Neuro: Distal pulses 2+. Sensation to light touch intact in LE.  Vital signs in last 24 hours: BP: ()/()  Arterial Line BP: ()/()   Imaging Review Plain radiographs demonstrate severe degenerative joint disease of the left hip. The bone quality appears to be adequate for age and reported activity level.  Assessment/Plan:  End stage arthritis, left hip  The patient history, physical examination, clinical judgement of the provider and imaging studies are consistent with end stage degenerative joint disease of the left hip and total hip arthroplasty is deemed medically necessary. The treatment options including medical management, injection therapy, arthroscopy and arthroplasty were discussed at length. The risks and benefits of total hip arthroplasty were presented and reviewed. The risks due to aseptic loosening, infection, stiffness, dislocation/subluxation, thromboembolic  complications and other imponderables were discussed. The patient acknowledged the explanation, agreed to proceed with the plan and consent was signed. Patient is being admitted for inpatient treatment for surgery,  pain control, PT, OT, prophylactic antibiotics, VTE prophylaxis, progressive ambulation and ADLs and discharge planning.The patient is planning to be discharged  home .  Therapy Plans: HEP Disposition: Home with Wife Planned DVT Prophylaxis: Aspirin 81 mg BID DME Needed: None PCP: Seabron Spates, DO (EPIC note from 07/15/2023) TXA: IV Allergies: NKDA Anesthesia Concerns: None BMI: 41.2 Last HgbA1c: 5.9% (06/2023)  Ph[facility]: [facility]  Other: -weight check 03/03 - aware he needs to lose 10 lbs -Percocet 10 mg TID - discussed dilaudid for breakthrough pain  - Patient was instructed on what medications to stop prior to surgery. - Follow-up visit in 2 weeks with Dr. Lequita Halt - Begin physical therapy following surgery - Pre-operative lab work as pre-surgical testing - Prescriptions will be provided in hospital at time of discharge  R. Arcola Jansky, PA-C Orthopedic Surgery [facility] [place]

## 2023-07-27 MED ORDER — OXYCODONE-ACETAMINOPHEN 10-325 MG PO TABS: 1.0000 | ORAL_TABLET | Freq: Three times a day (TID) | ORAL | 0 refills | Status: DC | PRN

## 2023-07-27 NOTE — Telephone Encounter (Signed)
Requesting: Percocet 10-325 Contract:N/A UDS: N/A Last Visit: 07/15/2023 Next Visit: N/A Last Refill: 06/26/2023  Please Advise

## 2023-08-03 NOTE — Patient Instructions (Addendum)
 SURGICAL WAITING ROOM VISITATION  Patients having surgery or a procedure may have no more than 2 support people in the waiting area - these visitors may rotate.    Children under the age of 63 must have an adult with them who is not the patient.  Due to an increase in RSV and influenza rates and associated hospitalizations, children ages 40 and under may not visit patients in [facility] hospitals.  Visitors with respiratory illnesses are discouraged from visiting and should remain at home.  If the patient needs to stay at the hospital during part of their recovery, the visitor guidelines for inpatient rooms apply. Pre-op nurse will coordinate an appropriate time for 1 support person to accompany patient in pre-op.  This support person may not rotate.    Please refer to the [facility] website for the visitor guidelines for Inpatients (after your surgery is over and you are in a regular room).    Your procedure is scheduled on: 08/17/23   Report to [facility] Main Entrance    Report to admitting at 8:30 AM   Call this number if you have problems the morning of surgery [phone]   Do not eat food :After Midnight.   After Midnight you may have the following liquids until 8:00 AM DAY OF SURGERY  Water Non-Citrus Juices (without pulp, NO RED-Apple, White grape, White cranberry) Black Coffee (NO MILK/CREAM OR CREAMERS, sugar ok)  Clear Tea (NO MILK/CREAM OR CREAMERS, sugar ok) regular and decaf                             Plain Jell-O (NO RED)                                           Fruit ices (not with fruit pulp, NO RED)                                     Popsicles (NO RED)                                                               Sports drinks like Gatorade (NO RED)    The day of surgery:  Drink ONE (1) Pre-Surgery G2 at 8:00 AM the morning of surgery. Drink in one sitting. Do not sip.  This drink was given to you during your hospital  pre-op appointment  visit. Nothing else to drink after completing the  Pre-Surgery G2.          If you have questions, please contact your surgeon's office.   FOLLOW BOWEL PREP AND ANY ADDITIONAL PRE OP INSTRUCTIONS YOU RECEIVED FROM YOUR SURGEON'S OFFICE!!!     Oral Hygiene is also important to reduce your risk of infection.                                    Remember - BRUSH YOUR TEETH THE MORNING OF SURGERY WITH YOUR REGULAR TOOTHPASTE  DENTURES WILL BE  REMOVED PRIOR TO SURGERY PLEASE DO NOT APPLY "Poly grip" OR ADHESIVES!!!   Stop all vitamins and herbal supplements 7 days before surgery.   Take these medicines the morning of surgery with A SIP OF WATER: Tylenol, Diltiazem, Percocet  DO NOT TAKE ANY ORAL DIABETIC MEDICATIONS DAY OF YOUR SURGERY  How to Manage Your Diabetes Before and After Surgery  Why is it important to control my blood sugar before and after surgery? Improving blood sugar levels before and after surgery helps healing and can limit problems. A way of improving blood sugar control is eating a healthy diet by:  Eating less sugar and carbohydrates  Increasing activity/exercise  Talking with your doctor about reaching your blood sugar goals High blood sugars (greater than 180 mg/dL) can raise your risk of infections and slow your recovery, so you will need to focus on controlling your diabetes during the weeks before surgery. Make sure that the doctor who takes care of your diabetes knows about your planned surgery including the date and location.  How do I manage my blood sugar before surgery? Check your blood sugar at least 4 times a day, starting 2 days before surgery, to make sure that the level is not too high or low. Check your blood sugar the morning of your surgery when you wake up and every 2 hours until you get to the Short Stay unit. If your blood sugar is less than 70 mg/dL, you will need to treat for low blood sugar: Do not take insulin. Treat a low blood sugar (less  than 70 mg/dL) with  cup of clear juice (cranberry or apple), 4 glucose tablets, OR glucose gel. Recheck blood sugar in 15 minutes after treatment (to make sure it is greater than 70 mg/dL). If your blood sugar is not greater than 70 mg/dL on recheck, call [phone] for further instructions. Report your blood sugar to the short stay nurse when you get to Short Stay.  If you are admitted to the hospital after surgery: Your blood sugar will be checked by the staff and you will probably be given insulin after surgery (instead of oral diabetes medicines) to make sure you have good blood sugar levels. The goal for blood sugar control after surgery is 80-180 mg/dL.   WHAT DO I DO ABOUT MY DIABETES MEDICATION?  Do not take oral diabetes medicines (pills) the morning of surgery.  Hold Farxiga 3 days. Last dose 08/13/23.  THE DAY BEFORE SURGERY, take Metformin as prescribed.      THE MORNING OF SURGERY, do not take Metformin   Reviewed and Endorsed by [facility] Patient Education Committee, August 2015                              You may not have any metal on your body including jewelry, and body piercing             Do not wear lotions, powders, cologne, or deodorant              Men may shave face and neck.   Do not bring valuables to the hospital. [facility] IS NOT             RESPONSIBLE   FOR VALUABLES.   Contacts, glasses, dentures or bridgework may not be worn into surgery.   Bring small overnight bag day of surgery.   DO NOT BRING YOUR HOME MEDICATIONS TO THE HOSPITAL. PHARMACY WILL  DISPENSE MEDICATIONS LISTED ON YOUR MEDICATION LIST TO YOU DURING YOUR ADMISSION IN THE HOSPITAL!   Special Instructions: Bring a copy of your healthcare power of attorney and living will documents the day of surgery if you haven't scanned them before.              Please read over the following fact sheets you were given: IF YOU HAVE QUESTIONS ABOUT YOUR PRE-OP INSTRUCTIONS PLEASE CALL  [phone] Contras    If you received a COVID test during your pre-op visit  it is requested that you wear a mask when out in public, stay away from anyone that may not be feeling well and notify your surgeon if you develop symptoms. If you test positive for Covid or have been in contact with anyone that has tested positive in the last 10 days please notify you surgeon.      Pre-operative 5 CHG Bath Instructions   You can play a key role in reducing the risk of infection after surgery. Your skin needs to be as free of germs as possible. You can reduce the number of germs on your skin by washing with CHG (chlorhexidine gluconate) soap before surgery. CHG is an antiseptic soap that kills germs and continues to kill germs even after washing.   DO NOT use if you have an allergy to chlorhexidine/CHG or antibacterial soaps. If your skin becomes reddened or irritated, stop using the CHG and notify one of our RNs at [phone].   Please shower with the CHG soap starting 4 days before surgery using the following schedule:     Please keep in mind the following:  DO NOT shave, including legs and underarms, starting the day of your first shower.   You may shave your face at any point before/day of surgery.  Place clean sheets on your bed the day you start using CHG soap. Use a clean washcloth (not used since being washed) for each shower. DO NOT sleep with pets once you start using the CHG.   CHG Shower Instructions:  If you choose to wash your hair and private area, wash first with your normal shampoo/soap.  After you use shampoo/soap, rinse your hair and body thoroughly to remove shampoo/soap residue.  Turn the water OFF and apply about 3 tablespoons (45 ml) of CHG soap to a CLEAN washcloth.  Apply CHG soap ONLY FROM YOUR NECK DOWN TO YOUR TOES (washing for 3-5 minutes)  DO NOT use CHG soap on face, private areas, open wounds, or sores.  Pay special attention to the area where your surgery is  being performed.  If you are having back surgery, having someone wash your back for you may be helpful. Wait 2 minutes after CHG soap is applied, then you may rinse off the CHG soap.  Pat dry with a clean towel  Put on clean clothes/pajamas   If you choose to wear lotion, please use ONLY the CHG-compatible lotions on the back of this paper.     Additional instructions for the day of surgery: DO NOT APPLY any lotions, deodorants, cologne, or perfumes.   Put on clean/comfortable clothes.  Brush your teeth.  Ask your nurse before applying any prescription medications to the skin.      CHG Compatible Lotions   Aveeno Moisturizing lotion  Cetaphil Moisturizing Cream  Cetaphil Moisturizing Lotion  Clairol Herbal Essence Moisturizing Lotion, Dry Skin  Clairol Herbal Essence Moisturizing Lotion, Extra Dry Skin  Clairol Herbal Essence Moisturizing Lotion, Normal Skin  Curel Age Defying Therapeutic Moisturizing Lotion with Alpha Hydroxy  Curel Extreme Care Body Lotion  Curel Soothing Hands Moisturizing Hand Lotion  Curel Therapeutic Moisturizing Cream, Fragrance-Free  Curel Therapeutic Moisturizing Lotion, Fragrance-Free  Curel Therapeutic Moisturizing Lotion, Original Formula  Eucerin Daily Replenishing Lotion  Eucerin Dry Skin Therapy Plus Alpha Hydroxy Crme  Eucerin Dry Skin Therapy Plus Alpha Hydroxy Lotion  Eucerin Original Crme  Eucerin Original Lotion  Eucerin Plus Crme Eucerin Plus Lotion  Eucerin TriLipid Replenishing Lotion  Keri Anti-Bacterial Hand Lotion  Keri Deep Conditioning Original Lotion Dry Skin Formula Softly Scented  Keri Deep Conditioning Original Lotion, Fragrance Free Sensitive Skin Formula  Keri Lotion Fast Absorbing Fragrance Free Sensitive Skin Formula  Keri Lotion Fast Absorbing Softly Scented Dry Skin Formula  Keri Original Lotion  Keri Skin Renewal Lotion Keri Silky Smooth Lotion  Keri Silky Smooth Sensitive Skin Lotion  Nivea Body Creamy  Conditioning Oil  Nivea Body Extra Enriched Teacher, adult education Moisturizing Lotion Nivea Crme  Nivea Skin Firming Lotion  NutraDerm 30 Skin Lotion  NutraDerm Skin Lotion  NutraDerm Therapeutic Skin Cream  NutraDerm Therapeutic Skin Lotion  ProShield Protective Hand Cream  Provon moisturizing lotion  WHAT IS A BLOOD TRANSFUSION? Blood Transfusion Information  A transfusion is the replacement of blood or some of its parts. Blood is made up of multiple cells which provide different functions. Red blood cells carry oxygen and are used for blood loss replacement. White blood cells fight against infection. Platelets control bleeding. Plasma helps clot blood. Other blood products are available for specialized needs, such as hemophilia or other clotting disorders. BEFORE THE TRANSFUSION  Who gives blood for transfusions?  Healthy volunteers who are fully evaluated to make sure their blood is safe. This is blood bank blood. Transfusion therapy is the safest it has ever been in the practice of medicine. Before blood is taken from a donor, a complete history is taken to make sure that person has no history of diseases nor engages in risky social behavior (examples are intravenous drug use or sexual activity with multiple partners). The donor's travel history is screened to minimize risk of transmitting infections, such as malaria. The donated blood is tested for signs of infectious diseases, such as HIV and hepatitis. The blood is then tested to be sure it is compatible with you in order to minimize the chance of a transfusion reaction. If you or a relative donates blood, this is often done in anticipation of surgery and is not appropriate for emergency situations. It takes many days to process the donated blood. RISKS AND COMPLICATIONS Although transfusion therapy is very safe and saves many lives, the main dangers of transfusion include:  Getting an infectious  disease. Developing a transfusion reaction. This is an allergic reaction to something in the blood you were given. Every precaution is taken to prevent this. The decision to have a blood transfusion has been considered carefully by your caregiver before blood is given. Blood is not given unless the benefits outweigh the risks. AFTER THE TRANSFUSION Right after receiving a blood transfusion, you will usually feel much better and more energetic. This is especially true if your red blood cells have gotten low (anemic). The transfusion raises the level of the red blood cells which carry oxygen, and this usually causes an energy increase. The nurse administering the transfusion will monitor you carefully for complications. HOME CARE INSTRUCTIONS  No special instructions are needed after a transfusion. You  may find your energy is better. Speak with your caregiver about any limitations on activity for underlying diseases you may have. SEEK MEDICAL CARE IF:  Your condition is not improving after your transfusion. You develop redness or irritation at the intravenous (IV) site. SEEK IMMEDIATE MEDICAL CARE IF:  Any of the following symptoms occur over the next 12 hours: Shaking chills. You have a temperature by mouth above 102 F (38.9 C), not controlled by medicine. Chest, back, or muscle pain. People around you feel you are not acting correctly or are confused. Shortness of breath or difficulty breathing. Dizziness and fainting. You get a rash or develop hives. You have a decrease in urine output. Your urine turns a dark color or changes to pink, red, or brown. Any of the following symptoms occur over the next 10 days: You have a temperature by mouth above 102 F (38.9 C), not controlled by medicine. Shortness of breath. Weakness after normal activity. The white part of the eye turns yellow (jaundice). You have a decrease in the amount of urine or are urinating less often. Your urine turns a  dark color or changes to pink, red, or brown. Document Released: 05/28/2000 Document Revised: 08/23/2011 Document Reviewed: 01/15/2008 [employer] Patient Information 2014 [place], [place].  _______________________________________________________________________  Incentive Spirometer  An incentive spirometer is a tool that can help keep your lungs clear and active. This tool measures how well you are filling your lungs with each breath. Taking long deep breaths may help reverse or decrease the chance of developing breathing (pulmonary) problems (especially infection) following: A long period of time when you are unable to move or be active. BEFORE THE PROCEDURE  If the spirometer includes an indicator to show your best effort, your nurse or respiratory therapist will set it to a desired goal. If possible, sit up straight or lean slightly forward. Try not to slouch. Hold the incentive spirometer in an upright position. INSTRUCTIONS FOR USE  Sit on the edge of your bed if possible, or sit up as far as you can in bed or on a chair. Hold the incentive spirometer in an upright position. Breathe out normally. Place the mouthpiece in your mouth and seal your lips tightly around it. Breathe in slowly and as deeply as possible, raising the piston or the ball toward the top of the column. Hold your breath for 3-5 seconds or for as long as possible. Allow the piston or ball to fall to the bottom of the column. Remove the mouthpiece from your mouth and breathe out normally. Rest for a few seconds and repeat Steps 1 through 7 at least 10 times every 1-2 hours when you are awake. Take your time and take a few normal breaths between deep breaths. The spirometer may include an indicator to show your best effort. Use the indicator as a goal to work toward during each repetition. After each set of 10 deep breaths, practice coughing to be sure your lungs are clear. If you have an incision (the cut made at the time  of surgery), support your incision when coughing by placing a pillow or rolled up towels firmly against it. Once you are able to get out of bed, walk around indoors and cough well. You may stop using the incentive spirometer when instructed by your caregiver.  RISKS AND COMPLICATIONS Take your time so you do not get dizzy or light-headed. If you are in pain, you may need to take or ask for pain medication before doing incentive  spirometry. It is harder to take a deep breath if you are having pain. AFTER USE Rest and breathe slowly and easily. It can be helpful to keep track of a log of your progress. Your caregiver can provide you with a simple table to help with this. If you are using the spirometer at home, follow these instructions: SEEK MEDICAL CARE IF:  You are having difficultly using the spirometer. You have trouble using the spirometer as often as instructed. Your pain medication is not giving enough relief while using the spirometer. You develop fever of 100.5 F (38.1 C) or higher. SEEK IMMEDIATE MEDICAL CARE IF:  You cough up bloody sputum that had not been present before. You develop fever of 102 F (38.9 C) or greater. You develop worsening pain at or near the incision site. MAKE SURE YOU:  Understand these instructions. Will watch your condition. Will get help right away if you are not doing well or get worse. Document Released: 10/11/2006 Document Revised: 08/23/2011 Document Reviewed: 12/12/2006 [facility] Patient Information 2014 [place], [place].   ________________________________________________________________________

## 2023-08-03 NOTE — Progress Notes (Addendum)
 COVID Vaccine Completed: yes  Date of COVID positive in last 51 days:no  PCP - Seabron Spates, DO Cardiologist - Dr. Johna Sheriff  Cardiac clearance by Edd Fabian, NP 07/18/23 in Epic  Chest x-ray - n/a EKG - 06/27/23 Epic Stress Test - 03/04/23 Epic ECHO - 03/01/23 Epic Cardiac Cath - n/a Pacemaker/ICD device last checked: n/a Spinal Cord Stimulator: n/a  Bowel Prep - no  Sleep Study - n/a CPAP -   Fasting Blood Sugar - 100-110 Checks Blood Sugar 1 times a day  Last dose of GLP1 agonist-  N/A GLP1 instructions:  Hold 7 days before surgery    Last dose of SGLT-2 inhibitors-  Farxiga SGLT-2 instructions:  Hold 3 days before surgery, last dose 08/13/23   Blood Thinner Instructions:  Last dose: n/a  Time: Aspirin Instructions: Last Dose:  Activity level: Can perform activities of daily living without stopping and without symptoms of chest pain or shortness of breath. No stairs due to left hip. Ambulates with cane or walker     Anesthesia review: DM2, PVD, RBBB, HTN, heart murmur, syncope  Patient denies shortness of breath, fever, cough and chest pain at PAT appointment  Patient verbalized understanding of instructions that were given to them at the PAT appointment. Patient was also instructed that they will need to review over the PAT instructions again at home before surgery.

## 2023-08-04 ENCOUNTER — Encounter (HOSPITAL_COMMUNITY): Admission: RE | Admit: 2023-08-04 | Payer: 59 | Source: Ambulatory Visit

## 2023-08-08 ENCOUNTER — Encounter (HOSPITAL_COMMUNITY)
Admission: RE | Admit: 2023-08-08 | Discharge: 2023-08-08 | Disposition: A | Discharge status: Home / Self Care | Payer: 59 | Source: Ambulatory Visit | Attending: Orthopedic Surgery | Admitting: Orthopedic Surgery

## 2023-08-08 ENCOUNTER — Encounter (HOSPITAL_COMMUNITY): Payer: Self-pay

## 2023-08-08 ENCOUNTER — Other Ambulatory Visit: Payer: Self-pay

## 2023-08-08 VITALS — BP 154/69 | HR 73 | Temp 98.2°F | Resp 16 | Ht 70.0 in | Wt 296.0 lb

## 2023-08-08 DIAGNOSIS — Z01818 Encounter for other preprocedural examination: Secondary | ICD-10-CM

## 2023-08-08 DIAGNOSIS — I4510 Unspecified right bundle-branch block: Secondary | ICD-10-CM | POA: Insufficient documentation

## 2023-08-08 DIAGNOSIS — E119 Type 2 diabetes mellitus without complications: Secondary | ICD-10-CM | POA: Insufficient documentation

## 2023-08-08 DIAGNOSIS — M1612 Unilateral primary osteoarthritis, left hip: Secondary | ICD-10-CM | POA: Diagnosis not present

## 2023-08-08 DIAGNOSIS — Z794 Long term (current) use of insulin: Secondary | ICD-10-CM | POA: Diagnosis not present

## 2023-08-08 DIAGNOSIS — Z7984 Long term (current) use of oral hypoglycemic drugs: Secondary | ICD-10-CM | POA: Diagnosis not present

## 2023-08-08 DIAGNOSIS — I10 Essential (primary) hypertension: Secondary | ICD-10-CM | POA: Insufficient documentation

## 2023-08-08 DIAGNOSIS — Z01812 Encounter for preprocedural laboratory examination: Secondary | ICD-10-CM | POA: Insufficient documentation

## 2023-08-08 DIAGNOSIS — E1165 Type 2 diabetes mellitus with hyperglycemia: Secondary | ICD-10-CM

## 2023-08-08 LAB — BASIC METABOLIC PANEL
Anion gap: 11 (ref 5–15)
BUN: 23 mg/dL (ref 8–23)
CO2: 26 mmol/L (ref 22–32)
Calcium: 9.5 mg/dL (ref 8.9–10.3)
Chloride: 99 mmol/L (ref 98–111)
Creatinine, Ser: 0.82 mg/dL (ref 0.61–1.24)
GFR, Estimated: >60 mL/min (ref >60)
Glucose, Bld: 93 mg/dL (ref 70–99)
Potassium: 4.7 mmol/L (ref 3.5–5.1)
Sodium: 136 mmol/L (ref 135–145)

## 2023-08-08 LAB — CBC
HCT: 39.1 % (ref 39.0–52.0)
Hemoglobin: 12.6 g/dL — ABNORMAL LOW (ref 13.0–17.0)
MCH: 32.4 pg (ref 26.0–34.0)
MCHC: 32.2 g/dL (ref 30.0–36.0)
MCV: 100.5 fL — ABNORMAL HIGH (ref 80.0–100.0)
Platelets: 268 10*3/uL (ref 150–400)
RBC: 3.89 MIL/uL — ABNORMAL LOW (ref 4.22–5.81)
RDW: 13.2 % (ref 11.5–15.5)
WBC: 6.2 10*3/uL (ref 4.0–10.5)
nRBC: 0 % (ref 0.0–0.2)

## 2023-08-08 LAB — SURGICAL PCR SCREEN
MRSA, PCR: NEGATIVE
Staphylococcus aureus: NEGATIVE

## 2023-08-08 LAB — GLUCOSE, CAPILLARY: Glucose-Capillary: 76 mg/dL (ref 70–99)

## 2023-08-09 ENCOUNTER — Encounter (HOSPITAL_COMMUNITY): Payer: Self-pay | Admitting: Physician Assistant

## 2023-08-09 NOTE — Progress Notes (Signed)
 Anesthesia Chart Review   Case: [id number] Date/Time: 08/17/23 1045   Procedure: TOTAL HIP ARTHROPLASTY ANTERIOR APPROACH (Left: Hip)   Anesthesia type: Choice   Pre-op diagnosis: Left hip osteoarthritis   Location: [facility] ROOM 10 / [facility]   Surgeons: [name], MD       DISCUSSION:72 y.o. never smoker with h/o HTN, RBBB, DM II, left hip OA scheduled for above procedure 08/17/2023 with Dr. [name].   Per cardiology preoperative evaluation 07/18/2023, "Chart reviewed as part of pre-operative protocol coverage. Given past medical history and time since last visit, based on ACC/AHA guidelines, Richard [name] would be at acceptable risk for the planned procedure without further cardiovascular testing.    His RCRI is low risk, 0.9% of major cardiac event.  He is able to complete greater than 4 METS of physical activity."   VS: BP (!) 154/69   Pulse 73   Temp 36.8 C (Oral)   Resp 16   Ht 5\' 10"  (1.778 m)   Wt 134.3 kg   SpO2 100%   BMI 42.47 kg/m   PROVIDERS: [name], DO is PCP    LABS: Labs reviewed: Acceptable for surgery. (all labs ordered are listed, but only abnormal results are displayed)  Labs Reviewed  CBC - Abnormal; Notable for the following components:      Result Value   RBC 3.89 (*)    Hemoglobin 12.6 (*)    MCV 100.5 (*)    All other components within normal limits  SURGICAL PCR SCREEN  BASIC METABOLIC PANEL  GLUCOSE, CAPILLARY  TYPE AND SCREEN     IMAGES:   EKG:   CV: Myocardial Perfusion 03/04/2023   LV perfusion is normal. There is no evidence of ischemia. There is no evidence of infarction. Diaphragm attenuation noted.   Left ventricular function is normal. Nuclear stress EF: 69%. The left ventricular ejection fraction is hyperdynamic (>65%). End diastolic cavity size is normal.   The study is normal. The study is low risk.  Echo 03/01/2023  1. Left ventricular ejection fraction, by estimation, is 60 to 65%. The  left  ventricle has normal function. The left ventricle has no regional  wall motion abnormalities. There is mild concentric left ventricular  hypertrophy. Left ventricular diastolic  parameters are indeterminate.   2. Right ventricular systolic function is normal. The right ventricular  size is mildly enlarged.   3. The mitral valve is normal in structure. Trivial mitral valve  regurgitation. No evidence of mitral stenosis.   4. The aortic valve is normal in structure. Aortic valve regurgitation is  not visualized. No aortic stenosis is present.   5. The inferior vena cava is normal in size with greater than 50%  respiratory variability, suggesting right atrial pressure of 3 mmHg.  Past Medical History:  Diagnosis Date   Allergy    seasonal   Arthritis     left knee,  right hip   Back pain    Bilateral swelling of feet    Complication of anesthesia    half awake during last colonscopy   Constipation    Diabetes mellitus    type 2   Heart murmur    Hx of adenomatous colonic polyps    2010 6 mm adenoma 10/06/2016 2 5 mm descending polyps    Hyperlipidemia    Hypertension    Joint pain    Lactose intolerance     Past Surgical History:  Procedure Laterality Date   COLONOSCOPY  COLONOSCOPY WITH PROPOFOL N/A 10/06/2016   Procedure: COLONOSCOPY WITH PROPOFOL;  Surgeon: [name], MD;  Location: [facility];  Service: Endoscopy;  Laterality: N/A;   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   Procedure: TOTAL HIP ARTHROPLASTY ANTERIOR APPROACH;  Surgeon: [name], MD;  Location: [facility];  Service: Orthopedics;  Laterality: Right;    MEDICATIONS:  Accu-Chek Softclix Lancets lancets   acetaminophen (TYLENOL) 500 MG tablet   aspirin 81 MG chewable tablet   atorvastatin (LIPITOR) 40 MG tablet   Blood Glucose Monitoring Suppl (ACCU-CHEK GUIDE ME) w/Device KIT   dapagliflozin propanediol (FARXIGA) 5 MG TABS tablet   diltiazem (CARDIZEM CD) 180 MG 24 hr capsule   glucose blood  (ONETOUCH ULTRA) test strip   glucose blood test strip   ibuprofen (ADVIL) 200 MG tablet   Insulin Pen Needle (B-D UF III MINI PEN NEEDLES) 31G X 5 MM MISC   lubiprostone (AMITIZA) 24 MCG capsule   metFORMIN (GLUCOPHAGE) 500 MG tablet   olmesartan-hydrochlorothiazide (BENICAR HCT) 40-25 MG tablet   ondansetron (ZOFRAN) 4 MG tablet   oxyCODONE-acetaminophen (PERCOCET) 10-325 MG tablet   tiZANidine (ZANAFLEX) 4 MG tablet   traMADol (ULTRAM) 50 MG tablet   triamcinolone cream (KENALOG) 0.1 %   Vitamin D, Ergocalciferol, (DRISDOL) 1.25 MG (50000 UNIT) CAPS capsule   No current facility-administered medications for this encounter.    Jodell Cipro Ward, PA-C [facility]-Surgical Testing [phone]

## 2023-08-11 ENCOUNTER — Ambulatory Visit (INDEPENDENT_AMBULATORY_CARE_PROVIDER_SITE_OTHER): Payer: Medicare Other | Admitting: Family Medicine

## 2023-08-15 ENCOUNTER — Telehealth: Payer: Self-pay

## 2023-08-15 NOTE — Telephone Encounter (Signed)
 Copied from CRM [id number]. Topic: Clinical - Medication Question >> Aug 15, 2023 11:24 AM Prudencio Pair wrote: Reason for CRM: Patient called stating that he was suppose to have had surgery on this past Wednesday on his left hip. He states he couldn't have it due to water retention. Patient wants to know if provider can write him a prescription for medication to reduce BMI & to help with the water retention. Please give him a call to advise. CB #: [id number].

## 2023-08-16 ENCOUNTER — Ambulatory Visit: Admitting: Family Medicine

## 2023-08-16 ENCOUNTER — Ambulatory Visit (HOSPITAL_BASED_OUTPATIENT_CLINIC_OR_DEPARTMENT_OTHER)
Admission: RE | Admit: 2023-08-16 | Discharge: 2023-08-16 | Disposition: A | Discharge status: Home / Self Care | Source: Ambulatory Visit | Attending: Family Medicine | Admitting: Family Medicine

## 2023-08-16 VITALS — BP 160/70 | HR 90 | Temp 98.5°F | Resp 20 | Ht 70.0 in | Wt 304.0 lb

## 2023-08-16 DIAGNOSIS — M79605 Pain in left leg: Secondary | ICD-10-CM | POA: Diagnosis not present

## 2023-08-16 DIAGNOSIS — R600 Localized edema: Secondary | ICD-10-CM

## 2023-08-16 DIAGNOSIS — M79604 Pain in right leg: Secondary | ICD-10-CM | POA: Diagnosis not present

## 2023-08-16 MED ORDER — FUROSEMIDE 40 MG PO TABS: 40.0000 mg | ORAL_TABLET | Freq: Every day | ORAL | 3 refills | Status: DC

## 2023-08-16 NOTE — Progress Notes (Unsigned)
 Established Patient Office Visit  Subjective   Patient ID: Richard [name], male    DOB: 22-Sep-1950  Age: 73 y.o. MRN: [id number]  Chief Complaint  Patient presents with   Leg Swelling    Both feet swelling and leg swelling, hard time getting shoes, pain with touch under knees    HPI Discussed the use of AI scribe software for clinical note transcription with the patient, who gave verbal consent to proceed.  History of Present Illness   Richard Busic "[name]" is a 73 year old male who presents with bilateral leg swelling and pain.  He has been experiencing significant bilateral leg swelling for approximately one month, primarily affecting his feet and progressing to his calves. The swelling is described as pitting and has made it difficult for him to wear shoes. No shortness of breath or chest pain.  He describes pain in the back of his thighs that began a few days ago. The pain is associated with the swelling but is a new symptom that has recently developed.  He has experienced a recent weight gain from 283 to 298 pounds over a few weeks, which he attributes to fluid retention rather than dietary changes. He is not currently taking any diuretics such as furosemide or hydrochlorothiazide.      Patient Active Problem List   Diagnosis Date Noted   Vitamin D deficiency 07/04/2023   Therapeutic opioid-induced constipation (OIC) 05/06/2023   OA (osteoarthritis) of hip 03/14/2023   Osteoarthritis of right hip 03/14/2023   Preventative health care 02/25/2023   Cardiac murmur 02/25/2023   Right bundle branch block 02/23/2023   Pre-op evaluation 02/23/2023   Syncope 02/23/2023   Right shoulder pain 02/25/2022   Acute right otitis media 02/25/2022   Morbid obesity (HCC) 02/25/2022   Type 2 diabetes mellitus with hyperglycemia, without long-term current use of insulin (HCC) 06/02/2021   Cough 12/30/2020   Controlled type 2 diabetes mellitus with complication, without long-term current  use of insulin (HCC) 02/21/2020   Right hip pain 08/16/2018   Viral upper respiratory tract infection 03/10/2017   Hx of adenomatous colonic polyps    Benign neoplasm of descending colon    Hypertension associated with diabetes (HCC) 09/06/2016   Tooth abscess 09/01/2015   Sinusitis 11/08/2014   Sinusitis, acute 10/28/2014   Trigger ring finger of right hand 02/06/2013   Suspicious nevus 02/06/2013   Avulsion of toenail 12/11/2010   Acute sinusitis 08/17/2010   Open wound of finger 07/10/2010   SCIATICA, LEFT 09/02/2009   UNSPECIFIED PERIPHERAL VASCULAR DISEASE 07/04/2009   Hyperlipidemia 05/05/2009   HEARTBURN 04/22/2009   Type 2 diabetes mellitus with diabetic peripheral angiopathy without gangrene (HCC) 01/23/2009   Past Medical History:  Diagnosis Date   Allergy    seasonal   Arthritis     left knee,  right hip   Back pain    Bilateral swelling of feet    Complication of anesthesia    half awake during last colonscopy   Constipation    Diabetes mellitus    type 2   Heart murmur    Hx of adenomatous colonic polyps    2010 6 mm adenoma 10/06/2016 2 5 mm descending polyps    Hyperlipidemia    Hypertension    Joint pain    Lactose intolerance    Past Surgical History:  Procedure Laterality Date   COLONOSCOPY     COLONOSCOPY WITH PROPOFOL N/A 10/06/2016   Procedure: COLONOSCOPY WITH PROPOFOL;  Surgeon: Baldo Ash  Sena Slate, MD;  Location: [facility];  Service: Endoscopy;  Laterality: N/A;   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   Procedure: TOTAL HIP ARTHROPLASTY ANTERIOR APPROACH;  Surgeon: [name], MD;  Location: [facility];  Service: Orthopedics;  Laterality: Right;   Social History   Tobacco Use   Smoking status: Never   Smokeless tobacco: Never  Vaping Use   Vaping status: Never Used  Substance Use Topics   Alcohol use: Yes    Alcohol/week: 7.0 standard drinks of alcohol    Types: 7 Standard drinks or equivalent per week    Comment: daily   Drug use: No    Social History   Socioeconomic History   Marital status: Married    Spouse name: Not on file   Number of children: Not on file   Years of education: Not on file   Highest education level: Not on file  Occupational History   Not on file  Tobacco Use   Smoking status: Never   Smokeless tobacco: Never  Vaping Use   Vaping status: Never Used  Substance and Sexual Activity   Alcohol use: Yes    Alcohol/week: 7.0 standard drinks of alcohol    Types: 7 Standard drinks or equivalent per week    Comment: daily   Drug use: No   Sexual activity: Not on file  Other Topics Concern   Not on file  Social History Narrative   Not on file   Social Drivers of Health   Financial Resource Strain: Not on file  Food Insecurity: No Food Insecurity (03/15/2023)   Hunger Vital Sign    Worried About Running Out of Food in the Last Year: Never true    Ran Out of Food in the Last Year: Never true  Transportation Needs: No Transportation Needs (03/15/2023)   PRAPARE - Administrator, Civil Service (Medical): No    Lack of Transportation (Non-Medical): No  Physical Activity: Not on file  Stress: Not on file  Social Connections: Not on file  Intimate Partner Violence: Not At Risk (03/15/2023)   Humiliation, Afraid, Rape, and Kick questionnaire    Fear of Current or Ex-Partner: No    Emotionally Abused: No    Physically Abused: No    Sexually Abused: No   Family Status  Relation Name Status   Mother  (Not Specified)   Sister  (Not Specified)   Brother  (Not Specified)   Neg Hx  (Not Specified)  No partnership data on file   Family History  Problem Relation Age of Onset   Hyperlipidemia Mother    Hypertension Mother    Diabetes Mother    Diabetes Sister    Hypertension Sister    Stroke Sister    Diabetes Brother    Colon cancer Neg Hx    Colon polyps Neg Hx    Rectal cancer Neg Hx    Stomach cancer Neg Hx    Esophageal cancer Neg Hx    Allergies  Allergen Reactions    Semaglutide Other (See Comments)    RYBELSUS. Severe headaches.       Review of Systems  Constitutional:  Negative for fever and malaise/fatigue.  HENT:  Negative for congestion.   Eyes:  Negative for blurred vision.  Respiratory:  Positive for cough, sputum production, shortness of breath and wheezing.   Cardiovascular:  Positive for leg swelling. Negative for chest pain and palpitations.  Gastrointestinal:  Negative for vomiting.  Musculoskeletal:  Negative for back  pain.  Skin:  Negative for rash.  Neurological:  Negative for loss of consciousness and headaches.      Objective:     BP (!) 160/70 (BP Location: Right Arm, Patient Position: Sitting, Cuff Size: Large)   Pulse 90   Temp 98.5 F (36.9 C) (Oral)   Resp 20   Ht 5\' 10"  (1.778 m)   Wt (!) 304 lb (137.9 kg)   SpO2 97%   BMI 43.62 kg/m  BP Readings from Last 3 Encounters:  08/16/23 (!) 160/70  08/08/23 (!) 154/69  07/15/23 (!) 170/80   Wt Readings from Last 3 Encounters:  08/16/23 (!) 304 lb (137.9 kg)  08/08/23 296 lb (134.3 kg)  07/15/23 288 lb 3.2 oz (130.7 kg)   SpO2 Readings from Last 3 Encounters:  08/16/23 97%  08/08/23 100%  07/15/23 97%      Physical Exam Vitals and nursing note reviewed.  Constitutional:      General: He is not in acute distress.    Appearance: Normal appearance. He is well-developed.  HENT:     Head: Normocephalic and atraumatic.  Eyes:     General: No scleral icterus.       Right eye: No discharge.        Left eye: No discharge.  Cardiovascular:     Rate and Rhythm: Normal rate and regular rhythm.     Heart sounds: No murmur heard. Pulmonary:     Effort: Pulmonary effort is normal. No respiratory distress.     Breath sounds: Normal breath sounds.  Musculoskeletal:        General: Swelling and tenderness present. Normal range of motion.     Cervical back: Normal range of motion and neck supple.     Right lower leg: Swelling and tenderness present. 2+ Pitting  Edema present.     Left lower leg: Swelling and tenderness present. 2+ Pitting Edema present.  Skin:    General: Skin is warm and dry.  Neurological:     Mental Status: He is alert and oriented to person, place, and time.  Psychiatric:        Mood and Affect: Mood normal.        Behavior: Behavior normal.        Thought Content: Thought content normal.        Judgment: Judgment normal.      No results found for any visits on 08/16/23.  {Labs (Optional):23779}  The 10-year ASCVD risk score (Arnett DK, et al., 2019) is: 37.6%    Assessment & Plan:   Problem List Items Addressed This Visit   None Visit Diagnoses       Lower extremity edema    -  Primary   Relevant Medications   furosemide (LASIX) 40 MG tablet   Other Relevant Orders   US Venous Img Lower Bilateral (DVT)   DG Chest 2 View     Pain in both lower extremities       Relevant Orders   US Venous Img Lower Bilateral (DVT)       No follow-ups on file.    [name], DO

## 2023-08-17 ENCOUNTER — Ambulatory Visit (HOSPITAL_COMMUNITY): Admission: RE | Admit: 2023-08-17 | Payer: 59 | Source: Ambulatory Visit | Admitting: Orthopedic Surgery

## 2023-08-17 ENCOUNTER — Ambulatory Visit (HOSPITAL_BASED_OUTPATIENT_CLINIC_OR_DEPARTMENT_OTHER)
Admission: RE | Admit: 2023-08-17 | Discharge: 2023-08-17 | Disposition: A | Discharge status: Home / Self Care | Source: Ambulatory Visit | Attending: Family Medicine | Admitting: Family Medicine

## 2023-08-17 ENCOUNTER — Encounter: Payer: Self-pay | Admitting: Family Medicine

## 2023-08-17 ENCOUNTER — Encounter (HOSPITAL_COMMUNITY): Admission: RE | Payer: Self-pay | Source: Home / Self Care

## 2023-08-17 DIAGNOSIS — R600 Localized edema: Secondary | ICD-10-CM | POA: Diagnosis present

## 2023-08-17 DIAGNOSIS — M79605 Pain in left leg: Secondary | ICD-10-CM | POA: Diagnosis present

## 2023-08-17 DIAGNOSIS — M79604 Pain in right leg: Secondary | ICD-10-CM

## 2023-08-17 LAB — TYPE AND SCREEN
ABO/RH(D): B POS
Antibody Screen: NEGATIVE

## 2023-08-17 SURGERY — ARTHROPLASTY, HIP, TOTAL, ANTERIOR APPROACH
Anesthesia: Choice | Site: Hip | Laterality: Left

## 2023-08-17 NOTE — Patient Instructions (Signed)
Edema  Edema is an abnormal buildup of fluids in the body tissues and under the skin. Swelling of the legs, feet, and ankles is a common symptom that becomes more likely as you get older. Swelling is also common in looser tissues, such as around the eyes. Pressing on the area may make a temporary dent in your skin (pitting edema). This fluid may also accumulate in your lungs (pulmonary edema). There are many possible causes of edema. Eating too much salt (sodium) and being on your feet or sitting for a long time can cause edema in your legs, feet, and ankles. Common causes of edema include: Certain medical conditions, such as heart failure, liver or kidney disease, and cancer. Weak leg blood vessels. An injury. Pregnancy. Medicines. Being obese. Low protein levels in the blood. Hot weather may make edema worse. Edema is usually painless. Your skin may look swollen or shiny. Follow these instructions at home: Medicines Take over-the-counter and prescription medicines only as told by your health care provider. Your health care provider may prescribe a medicine to help your body get rid of extra water (diuretic). Take this medicine if you are told to take it. Eating and drinking Eat a low-salt (low-sodium) diet to reduce fluid as told by your health care provider. Sometimes, eating less salt may reduce swelling. Depending on the cause of your swelling, you may need to limit how much fluid you drink (fluid restriction). General instructions Raise (elevate) the injured area above the level of your heart while you are sitting or lying down. Do not sit still or stand for long periods of time. Do not wear tight clothing. Do not wear garters on your upper legs. Exercise your legs to get your circulation going. This helps to move the fluid back into your blood vessels, and it may help the swelling go down. Wear compression stockings as told by your health care provider. These stockings help to prevent  blood clots and reduce swelling in your legs. It is important that these are the correct size. These stockings should be prescribed by your health care provider to prevent possible injuries. If elastic bandages or wraps are recommended, use them as told by your health care provider. Contact a health care provider if: Your edema does not get better with treatment. You have heart, liver, or kidney disease and have symptoms of edema. You have sudden and unexplained weight gain. Get help right away if: You develop shortness of breath or chest pain. You cannot breathe when you lie down. You develop pain, redness, or warmth in the swollen areas. You have heart, liver, or kidney disease and suddenly get edema. You have a fever and your symptoms suddenly get worse. These symptoms may be an emergency. Get help right away. Call 911. Do not wait to see if the symptoms will go away. Do not drive yourself to the hospital. Summary Edema is an abnormal buildup of fluids in the body tissues and under the skin. Eating too much salt (sodium)and being on your feet or sitting for a long time can cause edema in your legs, feet, and ankles. Raise (elevate) the injured area above the level of your heart while you are sitting or lying down. Follow your health care provider's instructions about diet and how much fluid you can drink. This information is not intended to replace advice given to you by your health care provider. Make sure you discuss any questions you have with your health care provider. Document Revised: 02/02/2021 Document  Reviewed: 02/02/2021 Elsevier Patient Education  2024 [employer].

## 2023-08-18 ENCOUNTER — Encounter (INDEPENDENT_AMBULATORY_CARE_PROVIDER_SITE_OTHER): Payer: Self-pay

## 2023-08-24 ENCOUNTER — Other Ambulatory Visit: Payer: Self-pay | Admitting: Family Medicine

## 2023-08-24 DIAGNOSIS — M25551 Pain in right hip: Secondary | ICD-10-CM

## 2023-08-24 MED ORDER — OXYCODONE-ACETAMINOPHEN 10-325 MG PO TABS: 1.0000 | ORAL_TABLET | Freq: Three times a day (TID) | ORAL | 0 refills | Status: DC | PRN

## 2023-08-24 NOTE — Telephone Encounter (Signed)
 Requesting: Percocet Contract: 2023 UDS: 2023 Last OV: 08/16/23 Next OV: n/a Last Refill: 07/27/23, #90--0 RF Database:   Please advise

## 2023-09-22 ENCOUNTER — Other Ambulatory Visit: Payer: Self-pay | Admitting: Family Medicine

## 2023-09-22 DIAGNOSIS — M25551 Pain in right hip: Secondary | ICD-10-CM

## 2023-09-22 NOTE — Telephone Encounter (Signed)
 Requesting: oxycodone 10-325mg   Contract: 12/10/21 UDS: 12/01/21 Last Visit: 08/16/23 Next Visit: None Last Refill: 08/24/23 #90 and 0RF   Please Advise

## 2023-09-24 MED ORDER — OXYCODONE-ACETAMINOPHEN 10-325 MG PO TABS: 1.0000 | ORAL_TABLET | Freq: Three times a day (TID) | ORAL | 0 refills | Status: DC | PRN

## 2023-10-06 ENCOUNTER — Encounter: Payer: Self-pay | Admitting: Family Medicine

## 2023-10-06 ENCOUNTER — Ambulatory Visit: Admitting: Family Medicine

## 2023-10-06 VITALS — BP 138/78 | HR 91 | Temp 98.2°F | Resp 20 | Ht 70.0 in | Wt 288.8 lb

## 2023-10-06 DIAGNOSIS — E1159 Type 2 diabetes mellitus with other circulatory complications: Secondary | ICD-10-CM | POA: Diagnosis not present

## 2023-10-06 DIAGNOSIS — E1151 Type 2 diabetes mellitus with diabetic peripheral angiopathy without gangrene: Secondary | ICD-10-CM | POA: Diagnosis not present

## 2023-10-06 DIAGNOSIS — M25551 Pain in right hip: Secondary | ICD-10-CM

## 2023-10-06 DIAGNOSIS — I152 Hypertension secondary to endocrine disorders: Secondary | ICD-10-CM | POA: Diagnosis not present

## 2023-10-06 DIAGNOSIS — Z7984 Long term (current) use of oral hypoglycemic drugs: Secondary | ICD-10-CM

## 2023-10-06 DIAGNOSIS — E118 Type 2 diabetes mellitus with unspecified complications: Secondary | ICD-10-CM | POA: Diagnosis not present

## 2023-10-06 DIAGNOSIS — I10 Essential (primary) hypertension: Secondary | ICD-10-CM | POA: Diagnosis not present

## 2023-10-06 MED ORDER — ONETOUCH ULTRA VI STRP
ORAL_STRIP | 12 refills | Status: AC
Start: 2023-10-06 — End: ?

## 2023-10-06 MED ORDER — OLMESARTAN MEDOXOMIL-HCTZ 40-25 MG PO TABS: 1.0000 | ORAL_TABLET | Freq: Every day | ORAL | 1 refills | Status: DC

## 2023-10-06 MED ORDER — DAPAGLIFLOZIN PROPANEDIOL 5 MG PO TABS: 5.0000 mg | ORAL_TABLET | Freq: Every day | ORAL | 1 refills | Status: DC

## 2023-10-06 MED ORDER — METFORMIN HCL 500 MG PO TABS: 1000.0000 mg | ORAL_TABLET | Freq: Two times a day (BID) | ORAL | 1 refills | Status: DC

## 2023-10-06 MED ORDER — TIZANIDINE HCL 4 MG PO TABS: 4.0000 mg | ORAL_TABLET | Freq: Four times a day (QID) | ORAL | 1 refills | Status: DC | PRN

## 2023-10-06 MED ORDER — DILTIAZEM HCL ER COATED BEADS 180 MG PO CP24: 180.0000 mg | ORAL_CAPSULE | Freq: Every day | ORAL | 1 refills | Status: DC

## 2023-10-06 NOTE — Progress Notes (Signed)
 Established Patient Office Visit  Subjective   Patient ID: Richard [name], male    DOB: [birth year]  Age: 73 y.o. MRN: [id number]  Chief Complaint  Patient presents with   Edema   Follow-up    HPI Discussed the use of AI scribe software for clinical note transcription with the patient, who gave verbal consent to proceed.  History of Present Illness Richard [name] "Mont Antis" is a 73 year old male who presents for follow-up regarding weight management and fluid retention.  He has made significant progress in weight loss but still needs to lose an additional 15 pounds to reach the target weight of 273 pounds for surgery eligibility. His current weight is 288 pounds, and his insurance requires a BMI below 40 for surgery approval.  He experienced back pain that resolved two weeks after starting a new medication. The pain was located in the lower back and has since subsided. He has been managing fluid retention with the medication, which he describes as 'twice this size'. He stopped the medication for a week without recurrence of symptoms.  He has a history of fluid retention, which led to a rapid weight increase from 283 to 298 pounds. He attributes this to fluid buildup, possibly related to a knee injury where fluid was aspirated. He is concerned about fluid retention affecting his weight loss goals and is considering adjusting the frequency of his medication use. He is also increasing his physical activity to aid in weight loss.  He inquires about alternative medications to assist with weight loss but has had adverse reactions to Rybelsus  in the past, which caused neck pain. He is cautious about trying new medications due to previous experiences.   Patient Active Problem List   Diagnosis Date Noted   Vitamin D  deficiency 07/04/2023   Therapeutic opioid-induced constipation (OIC) 05/06/2023   OA (osteoarthritis) of hip 03/14/2023   Osteoarthritis of right hip 03/14/2023   Preventative  health care 02/25/2023   Cardiac murmur 02/25/2023   Right bundle branch block 02/23/2023   Pre-op evaluation 02/23/2023   Syncope 02/23/2023   Right shoulder pain 02/25/2022   Acute right otitis media 02/25/2022   Morbid obesity (HCC) 02/25/2022   Type 2 diabetes mellitus with hyperglycemia, without long-term current use of insulin  (HCC) 06/02/2021   Cough 12/30/2020   Controlled type 2 diabetes mellitus with complication, without long-term current use of insulin  (HCC) 02/21/2020   Right hip pain 08/16/2018   Viral upper respiratory tract infection 03/10/2017   Hx of adenomatous colonic polyps    Benign neoplasm of descending colon    Hypertension associated with diabetes (HCC) 09/06/2016   Tooth abscess 09/01/2015   Sinusitis 11/08/2014   Sinusitis, acute 10/28/2014   Trigger ring finger of right hand 02/06/2013   Suspicious nevus 02/06/2013   Avulsion of toenail 12/11/2010   Acute sinusitis 08/17/2010   Open wound of finger 07/10/2010   SCIATICA, LEFT 09/02/2009   UNSPECIFIED PERIPHERAL VASCULAR DISEASE 07/04/2009   Hyperlipidemia 05/05/2009   HEARTBURN 04/22/2009   Type 2 diabetes mellitus with diabetic peripheral angiopathy without gangrene (HCC) 01/23/2009   Past Medical History:  Diagnosis Date   Allergy    seasonal   Arthritis     left knee,  right hip   Back pain    Bilateral swelling of feet    Complication of anesthesia    half awake during last colonscopy   Constipation    Diabetes mellitus    type 2   Heart murmur  Hx of adenomatous colonic polyps    2010 6 mm adenoma 10/06/2016 2 5 mm descending polyps    Hyperlipidemia    Hypertension    Joint pain    Lactose intolerance    Past Surgical History:  Procedure Laterality Date   COLONOSCOPY     COLONOSCOPY WITH PROPOFOL  N/A 10/06/2016   Procedure: COLONOSCOPY WITH PROPOFOL ;  Surgeon: Kenney Peacemaker, MD;  Location: [facility];  Service: Endoscopy;  Laterality: N/A;   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   Procedure: TOTAL HIP ARTHROPLASTY ANTERIOR APPROACH;  Surgeon: Liliane Rei, MD;  Location: [facility];  Service: Orthopedics;  Laterality: Right;   Social History   Tobacco Use   Smoking status: Never   Smokeless tobacco: Never  Vaping Use   Vaping status: Never Used  Substance Use Topics   Alcohol use: Yes    Alcohol/week: 7.0 standard drinks of alcohol    Types: 7 Standard drinks or equivalent per week    Comment: daily   Drug use: No   Social History   Socioeconomic History   Marital status: Married    Spouse name: Not on file   Number of children: Not on file   Years of education: Not on file   Highest education level: Not on file  Occupational History   Not on file  Tobacco Use   Smoking status: Never   Smokeless tobacco: Never  Vaping Use   Vaping status: Never Used  Substance and Sexual Activity   Alcohol use: Yes    Alcohol/week: 7.0 standard drinks of alcohol    Types: 7 Standard drinks or equivalent per week    Comment: daily   Drug use: No   Sexual activity: Not on file  Other Topics Concern   Not on file  Social History Narrative   Not on file   Social Drivers of Health   Financial Resource Strain: Not on file  Food Insecurity: No Food Insecurity (03/15/2023)   Hunger Vital Sign    Worried About Running Out of Food in the Last Year: Never true    Ran Out of Food in the Last Year: Never true  Transportation Needs: No Transportation Needs (03/15/2023)   PRAPARE - Administrator, Civil Service (Medical): No    Lack of Transportation (Non-Medical): No  Physical Activity: Not on file  Stress: Not on file  Social Connections: Not on file  Intimate Partner Violence: Not At Risk (03/15/2023)   Humiliation, Afraid, Rape, and Kick questionnaire    Fear of Current or Ex-Partner: No    Emotionally Abused: No    Physically Abused: No    Sexually Abused: No   Family Status  Relation Name Status   Mother  (Not Specified)   Sister   (Not Specified)   Brother  (Not Specified)   Neg Hx  (Not Specified)  No partnership data on file   Family History  Problem Relation Age of Onset   Hyperlipidemia Mother    Hypertension Mother    Diabetes Mother    Diabetes Sister    Hypertension Sister    Stroke Sister    Diabetes Brother    Colon cancer Neg Hx    Colon polyps Neg Hx    Rectal cancer Neg Hx    Stomach cancer Neg Hx    Esophageal cancer Neg Hx    Allergies  Allergen Reactions   Semaglutide  Other (See Comments)    RYBELSUS . Severe headaches.  Review of Systems  Constitutional:  Negative for fever and malaise/fatigue.  HENT:  Negative for congestion.   Eyes:  Negative for blurred vision.  Respiratory:  Negative for cough and shortness of breath.   Cardiovascular:  Negative for chest pain, palpitations and leg swelling.  Gastrointestinal: Negative.  Negative for vomiting.  Musculoskeletal:  Negative for back pain.  Skin:  Negative for rash.  Neurological:  Negative for loss of consciousness and headaches.      Objective:     BP 138/78 (BP Location: Right Arm, Patient Position: Sitting, Cuff Size: Large)   Pulse 91   Temp 98.2 F (36.8 C) (Oral)   Resp 20   Ht 5\' 10"  (1.778 m)   Wt 288 lb 12.8 oz (131 kg)   SpO2 98%   BMI 41.44 kg/m  BP Readings from Last 3 Encounters:  10/06/23 138/78  08/16/23 (!) 160/70  08/08/23 (!) 154/69   Wt Readings from Last 3 Encounters:  10/06/23 288 lb 12.8 oz (131 kg)  08/16/23 (!) 304 lb (137.9 kg)  08/08/23 296 lb (134.3 kg)   SpO2 Readings from Last 3 Encounters:  10/06/23 98%  08/16/23 97%  08/08/23 100%      Physical Exam Vitals and nursing note reviewed.  Constitutional:      General: He is not in acute distress.    Appearance: Normal appearance. He is well-developed.  HENT:     Head: Normocephalic and atraumatic.  Eyes:     General: No scleral icterus.       Right eye: No discharge.        Left eye: No discharge.  Cardiovascular:      Rate and Rhythm: Normal rate and regular rhythm.     Heart sounds: No murmur heard. Pulmonary:     Effort: Pulmonary effort is normal. No respiratory distress.     Breath sounds: Normal breath sounds.  Musculoskeletal:        General: Tenderness present.     Cervical back: Normal range of motion and neck supple.     Left hip: Tenderness present. Decreased range of motion.     Right lower leg: No edema.     Left lower leg: No edema.  Skin:    General: Skin is warm and dry.  Neurological:     General: No focal deficit present.     Mental Status: He is alert and oriented to person, place, and time.  Psychiatric:        Mood and Affect: Mood normal.        Behavior: Behavior normal.        Thought Content: Thought content normal.        Judgment: Judgment normal.      No results found for any visits on 10/06/23.  Last CBC Lab Results  Component Value Date   WBC 6.2 08/08/2023   HGB 12.6 (L) 08/08/2023   HCT 39.1 08/08/2023   MCV 100.5 (H) 08/08/2023   MCH 32.4 08/08/2023   RDW 13.2 08/08/2023   PLT 268 08/08/2023   Last metabolic panel Lab Results  Component Value Date   GLUCOSE 93 08/08/2023   NA 136 08/08/2023   K 4.7 08/08/2023   CL 99 08/08/2023   CO2 26 08/08/2023   BUN 23 08/08/2023   CREATININE 0.82 08/08/2023   GFRNONAA >60 08/08/2023   CALCIUM  9.5 08/08/2023   PROT 7.4 07/15/2023   ALBUMIN 4.5 06/27/2023   BILITOT 0.8 07/15/2023   ALKPHOS 60 06/27/2023  AST 14 07/15/2023   ALT 10 07/15/2023   ANIONGAP 11 08/08/2023   Last lipids Lab Results  Component Value Date   CHOL 132 07/15/2023   HDL 79 07/15/2023   LDLCALC 38 07/15/2023   LDLDIRECT 133.0 12/05/2017   TRIG 69 07/15/2023   CHOLHDL 1.7 07/15/2023   Last hemoglobin A1c Lab Results  Component Value Date   HGBA1C 5.9 (H) 07/15/2023   Last thyroid  functions Lab Results  Component Value Date   TSH 0.41 07/15/2023   T3TOTAL 90 06/22/2022   Last vitamin D  Lab Results  Component  Value Date   VD25OH 39.18 11/30/2022   Last vitamin B12 and Folate Lab Results  Component Value Date   VITAMINB12 121 (L) 11/30/2022   FOLATE 9.6 06/22/2022      The 10-year ASCVD risk score (Arnett DK, et al., 2019) is: 29.9%    Assessment & Plan:   Problem List Items Addressed This Visit       Unprioritized   Type 2 diabetes mellitus with diabetic peripheral angiopathy without gangrene (HCC) - Primary   Relevant Medications   metFORMIN  (GLUCOPHAGE ) 500 MG tablet   dapagliflozin  propanediol (FARXIGA ) 5 MG TABS tablet   olmesartan -hydrochlorothiazide  (BENICAR  HCT) 40-25 MG tablet   diltiazem  (CARDIZEM  CD) 180 MG 24 hr capsule   glucose blood (ONETOUCH ULTRA) test strip   Controlled type 2 diabetes mellitus with complication, without long-term current use of insulin  (HCC)   Relevant Medications   metFORMIN  (GLUCOPHAGE ) 500 MG tablet   dapagliflozin  propanediol (FARXIGA ) 5 MG TABS tablet   olmesartan -hydrochlorothiazide  (BENICAR  HCT) 40-25 MG tablet   Right hip pain   Relevant Medications   tiZANidine  (ZANAFLEX ) 4 MG tablet   Hypertension associated with diabetes (HCC)   Relevant Medications   metFORMIN  (GLUCOPHAGE ) 500 MG tablet   dapagliflozin  propanediol (FARXIGA ) 5 MG TABS tablet   olmesartan -hydrochlorothiazide  (BENICAR  HCT) 40-25 MG tablet   diltiazem  (CARDIZEM  CD) 180 MG 24 hr capsule   Other Relevant Orders   Basic metabolic panel with GFR   Other Visit Diagnoses       Essential hypertension       Relevant Medications   olmesartan -hydrochlorothiazide  (BENICAR  HCT) 40-25 MG tablet   diltiazem  (CARDIZEM  CD) 180 MG 24 hr capsule     Assessment and Plan Assessment & Plan Obesity   He has obesity with a BMI over 40, currently weighing 288 lbs. He needs to lose 15 lbs to reach a BMI under 40 for surgical clearance, as required by insurance due to increased surgical risk. Encourage weight loss to reach 273 lbs and discuss insurance requirements. Increase physical  activity to aid weight loss.  Hip pain   He experiences chronic left hip pain, worsened by certain positions, and persists despite using a hip chair. Hip surgery is planned once the weight loss goal is achieved. Encourage continued use of the hip chair to alleviate pain.  Fluid retention   Fluid retention resolved after medication use. He stopped the medication for a week without recurrence. Discussed the potential for recurrence and the importance of monitoring. Advised to keep medication on hand for future use if fluid retention recurs. Check electrolytes to ensure stable potassium levels.    No follow-ups on file.    Carston Riedl R Lowne Chase, DO

## 2023-10-07 ENCOUNTER — Encounter: Payer: Self-pay | Admitting: Family Medicine

## 2023-10-07 LAB — BASIC METABOLIC PANEL WITH GFR
BUN: 15 mg/dL (ref 6–23)
CO2: 25 meq/L (ref 19–32)
Calcium: 9.9 mg/dL (ref 8.4–10.5)
Chloride: 101 meq/L (ref 96–112)
Creatinine, Ser: 0.93 mg/dL (ref 0.40–1.50)
GFR: 83.07 mL/min (ref >60.00)
Glucose, Bld: 104 mg/dL — ABNORMAL HIGH (ref 70–99)
Potassium: 4.6 meq/L (ref 3.5–5.1)
Sodium: 140 meq/L (ref 135–145)

## 2023-10-17 ENCOUNTER — Other Ambulatory Visit (INDEPENDENT_AMBULATORY_CARE_PROVIDER_SITE_OTHER): Payer: Self-pay | Admitting: Family Medicine

## 2023-10-17 ENCOUNTER — Encounter: Payer: Self-pay | Admitting: Family Medicine

## 2023-10-17 DIAGNOSIS — E559 Vitamin D deficiency, unspecified: Secondary | ICD-10-CM

## 2023-10-17 DIAGNOSIS — T148XXA Other injury of unspecified body region, initial encounter: Secondary | ICD-10-CM

## 2023-10-17 MED ORDER — TRIAMCINOLONE ACETONIDE 0.1 % EX CREA: 1.0000 | TOPICAL_CREAM | Freq: Two times a day (BID) | CUTANEOUS | 5 refills | Status: AC

## 2023-10-21 ENCOUNTER — Other Ambulatory Visit: Payer: Self-pay | Admitting: Family Medicine

## 2023-10-21 DIAGNOSIS — M25551 Pain in right hip: Secondary | ICD-10-CM

## 2023-10-21 MED ORDER — OXYCODONE-ACETAMINOPHEN 10-325 MG PO TABS
1.0000 | ORAL_TABLET | Freq: Three times a day (TID) | ORAL | 0 refills | Status: DC | PRN
Start: 2023-10-21 — End: 2023-11-24

## 2023-10-21 NOTE — Telephone Encounter (Signed)
 Requesting: Percocet 10-325 mg Contract: N/A UDS: N/A Last Visit: 10/06/2023 Next Visit: N/A Last Refill: 09/24/2023  Please Advise

## 2023-11-22 ENCOUNTER — Other Ambulatory Visit (INDEPENDENT_AMBULATORY_CARE_PROVIDER_SITE_OTHER): Payer: Self-pay | Admitting: Family Medicine

## 2023-11-22 DIAGNOSIS — E559 Vitamin D deficiency, unspecified: Secondary | ICD-10-CM

## 2023-11-24 ENCOUNTER — Other Ambulatory Visit: Payer: Self-pay | Admitting: Family Medicine

## 2023-11-24 DIAGNOSIS — M25551 Pain in right hip: Secondary | ICD-10-CM

## 2023-11-24 DIAGNOSIS — E785 Hyperlipidemia, unspecified: Secondary | ICD-10-CM

## 2023-11-24 MED ORDER — ATORVASTATIN CALCIUM 40 MG PO TABS: 40.0000 mg | ORAL_TABLET | Freq: Every day | ORAL | 0 refills | Status: DC

## 2023-11-24 MED ORDER — OXYCODONE-ACETAMINOPHEN 10-325 MG PO TABS: 1.0000 | ORAL_TABLET | Freq: Three times a day (TID) | ORAL | 0 refills | Status: DC | PRN

## 2023-11-24 NOTE — Telephone Encounter (Signed)
 Copied from CRM [id number]. Topic: Clinical - Medication Refill >> Nov 24, 2023  8:47 AM Allyne Areola wrote: Medication: oxyCODONE -acetaminophen  (PERCOCET) 10-325 MG tablet & atorvastatin  (LIPITOR) 40 MG tablet  Has the patient contacted their pharmacy? No (Agent: If no, request that the patient contact the pharmacy for the refill. If patient does not wish to contact the pharmacy document the reason why and proceed with request.) (Agent: If yes, when and what did the pharmacy advise?)  This is the patient's preferred pharmacy:  [phone] [place] - [address] [place] [address] Phone: [phone] Fax: [phone]  Is this the correct pharmacy for this prescription? Yes If no, delete pharmacy and type the correct one.   Has the prescription been filled recently? No  Is the patient out of the medication? Yes  Has the patient been seen for an appointment in the last year OR does the patient have an upcoming appointment? Yes  Can we respond through MyChart? Yes  Agent: Please be advised that Rx refills may take up to 3 business days. We ask that you follow-up with your pharmacy.

## 2023-11-24 NOTE — Telephone Encounter (Signed)
 Requesting: oxycodone  10-325mg   Contract: 12/01/21 UDS: 12/01/21 Last Visit: 10/06/23 Next Visit: None Last Refill: 10/21/23 #90 and 0RF   Please Advise

## 2023-12-21 ENCOUNTER — Telehealth: Payer: Self-pay

## 2023-12-21 NOTE — Telephone Encounter (Signed)
 During office visit on 10/07/23, patient asked if he could go to the [place] closer to him for weight checks. We advised he could but we need to let that office know. Dr. Antonio verbalized that patient schedule nurse visits for weight checks.

## 2023-12-22 ENCOUNTER — Ambulatory Visit: Admitting: Family Medicine

## 2023-12-22 ENCOUNTER — Ambulatory Visit

## 2023-12-22 VITALS — Wt 289.4 lb

## 2023-12-22 DIAGNOSIS — E669 Obesity, unspecified: Secondary | ICD-10-CM

## 2023-12-22 DIAGNOSIS — Z6841 Body Mass Index (BMI) 40.0 and over, adult: Secondary | ICD-10-CM | POA: Diagnosis not present

## 2023-12-22 NOTE — Progress Notes (Signed)
  Patient presents today for weight check with the goal of reaching a BMI below 40 Weight today is 289.4lbs

## 2023-12-23 ENCOUNTER — Other Ambulatory Visit: Payer: Self-pay

## 2023-12-23 ENCOUNTER — Other Ambulatory Visit (INDEPENDENT_AMBULATORY_CARE_PROVIDER_SITE_OTHER): Payer: Self-pay | Admitting: Family Medicine

## 2023-12-23 DIAGNOSIS — L853 Xerosis cutis: Secondary | ICD-10-CM

## 2023-12-23 DIAGNOSIS — E118 Type 2 diabetes mellitus with unspecified complications: Secondary | ICD-10-CM

## 2023-12-23 DIAGNOSIS — M25551 Pain in right hip: Secondary | ICD-10-CM

## 2023-12-23 DIAGNOSIS — I10 Essential (primary) hypertension: Secondary | ICD-10-CM

## 2023-12-23 DIAGNOSIS — E559 Vitamin D deficiency, unspecified: Secondary | ICD-10-CM

## 2023-12-23 DIAGNOSIS — E1151 Type 2 diabetes mellitus with diabetic peripheral angiopathy without gangrene: Secondary | ICD-10-CM

## 2023-12-23 MED ORDER — METFORMIN HCL 500 MG PO TABS: 1000.0000 mg | ORAL_TABLET | Freq: Two times a day (BID) | ORAL | 1 refills | Status: DC

## 2023-12-23 MED ORDER — DAPAGLIFLOZIN PROPANEDIOL 5 MG PO TABS: 5.0000 mg | ORAL_TABLET | Freq: Every day | ORAL | 1 refills | Status: DC

## 2023-12-23 MED ORDER — AMMONIUM LACTATE 12 % EX CREA: TOPICAL_CREAM | CUTANEOUS | 0 refills | Status: AC | PRN

## 2023-12-23 MED ORDER — OLMESARTAN MEDOXOMIL-HCTZ 40-25 MG PO TABS: 1.0000 | ORAL_TABLET | Freq: Every day | ORAL | 1 refills | Status: DC

## 2023-12-23 MED ORDER — TIZANIDINE HCL 4 MG PO TABS: 4.0000 mg | ORAL_TABLET | Freq: Four times a day (QID) | ORAL | 1 refills | Status: DC | PRN

## 2023-12-23 MED ORDER — OXYCODONE-ACETAMINOPHEN 10-325 MG PO TABS: 1.0000 | ORAL_TABLET | Freq: Three times a day (TID) | ORAL | 0 refills | Status: DC | PRN

## 2023-12-23 NOTE — Telephone Encounter (Signed)
 Pt requesting refill on cream but d/c in 2024    Requesting: Percocet Contract: n/a UDS: n/a Last OV: 10/06/23 Next OV: n/a Last Refill: 11/24/23, #90--0 RF Database:   Please advise

## 2023-12-23 NOTE — Progress Notes (Signed)
 Pt requesting refill on cream but d/c in 2024    Requesting: Percocet Contract: n/a UDS: n/a Last OV: 10/06/23 Next OV: n/a Last Refill: 11/24/23, #90--0 RF Database:   Please advise

## 2024-01-19 ENCOUNTER — Other Ambulatory Visit: Payer: Self-pay | Admitting: Family Medicine

## 2024-01-19 DIAGNOSIS — M25551 Pain in right hip: Secondary | ICD-10-CM

## 2024-01-19 DIAGNOSIS — E1159 Type 2 diabetes mellitus with other circulatory complications: Secondary | ICD-10-CM

## 2024-01-19 MED ORDER — OXYCODONE-ACETAMINOPHEN 10-325 MG PO TABS: 1.0000 | ORAL_TABLET | Freq: Three times a day (TID) | ORAL | 0 refills | Status: DC | PRN

## 2024-01-19 MED ORDER — DILTIAZEM HCL ER COATED BEADS 180 MG PO CP24: 180.0000 mg | ORAL_CAPSULE | Freq: Every day | ORAL | 1 refills | Status: AC

## 2024-01-19 NOTE — Telephone Encounter (Signed)
 Copied from CRM [id number]. Topic: Clinical - Medication Refill >> Jan 19, 2024  8:57 AM Mia F wrote: Medication: diltiazem  (CARDIZEM  CD) 180 MG 24 hr capsule   oxyCODONE -acetaminophen  (PERCOCET) 10-325 MG tablet     Has the patient contacted their pharmacy? Yes (Agent: If no, request that the patient contact the pharmacy for the refill. If patient does not wish to contact the pharmacy document the reason why and proceed with request.) (Agent: If yes, when and what did the pharmacy advise?)  This is the patient's preferred pharmacy:  [phone], [place] - [address] [place] [address] Phone: [phone] Fax: [phone]  Is this the correct pharmacy for this prescription? Yes If no, delete pharmacy and type the correct one.   Has the prescription been filled recently? No  Is the patient out of the medication? Yes  Has the patient been seen for an appointment in the last year OR does the patient have an upcoming appointment? Yes  Can we respond through MyChart? Yes  Agent: Please be advised that Rx refills may take up to 3 business days. We ask that you follow-up with your pharmacy.

## 2024-01-19 NOTE — Telephone Encounter (Signed)
 Requesting: Percocet Contract: 2023 UDS: 2023 Last OV: 10/06/2023 Next OV: n/a Last Refill: 12/23/2023, #90--0 RF Database:   Please advise

## 2024-02-17 ENCOUNTER — Other Ambulatory Visit: Payer: Self-pay | Admitting: Family Medicine

## 2024-02-17 DIAGNOSIS — E785 Hyperlipidemia, unspecified: Secondary | ICD-10-CM

## 2024-02-17 DIAGNOSIS — M25551 Pain in right hip: Secondary | ICD-10-CM

## 2024-02-17 MED ORDER — ATORVASTATIN CALCIUM 40 MG PO TABS: 40.0000 mg | ORAL_TABLET | Freq: Every day | ORAL | 0 refills | Status: DC

## 2024-02-17 NOTE — Telephone Encounter (Signed)
 Copied from CRM [id number]. Topic: Clinical - Medication Refill >> Feb 17, 2024 12:59 PM Lauren C wrote: Medication:  atorvastatin  (LIPITOR) 40 MG tablet  oxyCODONE -acetaminophen  (PERCOCET) 10-325 MG tablet   Has the patient contacted their pharmacy? No (Agent: If no, request that the patient contact the pharmacy for the refill. If patient does not wish to contact the pharmacy document the reason why and proceed with request.) (Agent: If yes, when and what did the pharmacy advise?)  This is the patient's preferred pharmacy:  [phone] [facility], [place] - [address] [place] [address] Phone: [phone] Fax: [phone]  Is this the correct pharmacy for this prescription? Yes If no, delete pharmacy and type the correct one.   Has the prescription been filled recently? Yes  Is the patient out of the medication? Yes  Has the patient been seen for an appointment in the last year OR does the patient have an upcoming appointment? Yes  Can we respond through MyChart? Yes  Agent: Please be advised that Rx refills may take up to 3 business days. We ask that you follow-up with your pharmacy.

## 2024-02-17 NOTE — Telephone Encounter (Signed)
 01/19/24 90 tabs/0 RF (30 day)

## 2024-02-17 NOTE — Telephone Encounter (Signed)
 Requesting: oxycodone  10-325mg   Contract: 12/01/21 UDS: 12/01/21 Last Visit: 10/06/23 Next Visit: None Last Refill: 01/19/24 #90 and 0RF   Please Advise

## 2024-02-19 MED ORDER — OXYCODONE-ACETAMINOPHEN 10-325 MG PO TABS: 1.0000 | ORAL_TABLET | Freq: Three times a day (TID) | ORAL | 0 refills | Status: DC | PRN

## 2024-03-19 ENCOUNTER — Other Ambulatory Visit: Payer: Self-pay | Admitting: Family Medicine

## 2024-03-19 DIAGNOSIS — M25551 Pain in right hip: Secondary | ICD-10-CM

## 2024-03-19 NOTE — Telephone Encounter (Signed)
 Copied from CRM [id number]. Topic: Clinical - Medication Refill >> Mar 19, 2024  4:25 PM Corin V wrote: Medication: oxyCODONE -acetaminophen  (PERCOCET) 10-325 MG tablet Accu-Chek Softclix Lancets lancets  Has the patient contacted their pharmacy? Yes (Agent: If no, request that the patient contact the pharmacy for the refill. If patient does not wish to contact the pharmacy document the reason why and proceed with request.) (Agent: If yes, when and what did the pharmacy advise?)  This is the patient's preferred pharmacy:  [facility] [phone], [place] - [address] [place] [address] Phone: [phone] Fax: [phone]  Is this the correct pharmacy for this prescription? Yes If no, delete pharmacy and type the correct one.   Has the prescription been filled recently? No  Is the patient out of the medication? Yes  Has the patient been seen for an appointment in the last year OR does the patient have an upcoming appointment? Yes  Can we respond through MyChart? Yes  Agent: Please be advised that Rx refills may take up to 3 business days. We ask that you follow-up with your pharmacy.

## 2024-03-19 NOTE — Telephone Encounter (Signed)
 Pt informed 02/2024 that he is due for follow-up (see below). No appt scheduled. Rx's denied.   Me to Myshawn [place]ir[place]     02/17/24  1:21 PM Good afternoon, Per our records, you are due for a follow-up with Dr. Antonio. Please call the office at your earliest convenience to schedule an appointment.    Thank you, KDC  Last read by Sheena Leonce Gear at 5:38PM on 02/17/2024.

## 2024-03-19 NOTE — Telephone Encounter (Deleted)
 Requesting: oxycodone  10-325mg  Contract:12/01/21 UDS:12/01/21 Last Visit: 10/06/23 Next Visit: None Last Refill: 02/19/24 #90 and 0RF   Please Advise

## 2024-03-23 ENCOUNTER — Other Ambulatory Visit: Payer: Self-pay | Admitting: Family Medicine

## 2024-03-23 DIAGNOSIS — M25551 Pain in right hip: Secondary | ICD-10-CM

## 2024-03-23 NOTE — Telephone Encounter (Signed)
 Copied from CRM [id number]. Topic: Clinical - Medication Refill >> Mar 23, 2024  2:32 PM Shereese L wrote: Medication: Accu-Chek Softclix Lancets lancets  oxyCODONE -acetaminophen  (PERCOCET) 10-325 MG tablet   Has the patient contacted their pharmacy? Yes (Agent: If no, request that the patient contact the pharmacy for the refill. If patient does not wish to contact the pharmacy document the reason why and proceed with request.) (Agent: If yes, when and what did the pharmacy advise?)  This is the patient's preferred pharmacy:  [phone] [facility], [place] - [address] [place] [address] Phone: [phone] Fax: [phone]  Is this the correct pharmacy for this prescription? Yes If no, delete pharmacy and type the correct one.   Has the prescription been filled recently? Yes  Is the patient out of the medication? Yes  Has the patient been seen for an appointment in the last year OR does the patient have an upcoming appointment? Yes  Can we respond through MyChart? Yes  Agent: Please be advised that Rx refills may take up to 3 business days. We ask that you follow-up with your pharmacy.

## 2024-03-23 NOTE — Telephone Encounter (Signed)
 Requesting: Percocet 10-325 Contract: N/A UDS: N/A Last Visit: 10/06/2023 Next Visit: N/A Last Refill: 02/19/2024  Please Advise

## 2024-03-23 NOTE — Telephone Encounter (Signed)
 Requesting: Percocet Contract: 2023 UDS: 2023 Last OV: 10/06/2023 Next OV: n/a Last Refill: 09/07/202, #90--0 RF Database:   Please advise

## 2024-03-24 MED ORDER — ACCU-CHEK SOFTCLIX LANCETS MISC: 12 refills | Status: AC

## 2024-03-24 MED ORDER — OXYCODONE-ACETAMINOPHEN 10-325 MG PO TABS: 1.0000 | ORAL_TABLET | Freq: Three times a day (TID) | ORAL | 0 refills | Status: DC | PRN

## 2024-03-27 ENCOUNTER — Other Ambulatory Visit: Payer: Self-pay | Admitting: Family Medicine

## 2024-03-27 ENCOUNTER — Other Ambulatory Visit (INDEPENDENT_AMBULATORY_CARE_PROVIDER_SITE_OTHER): Payer: Self-pay | Admitting: Family Medicine

## 2024-03-27 DIAGNOSIS — R600 Localized edema: Secondary | ICD-10-CM

## 2024-03-27 DIAGNOSIS — E785 Hyperlipidemia, unspecified: Secondary | ICD-10-CM

## 2024-03-27 DIAGNOSIS — E559 Vitamin D deficiency, unspecified: Secondary | ICD-10-CM

## 2024-04-23 ENCOUNTER — Other Ambulatory Visit: Payer: Self-pay | Admitting: Family Medicine

## 2024-04-23 DIAGNOSIS — M25551 Pain in right hip: Secondary | ICD-10-CM

## 2024-04-23 DIAGNOSIS — E785 Hyperlipidemia, unspecified: Secondary | ICD-10-CM

## 2024-04-23 MED ORDER — OXYCODONE-ACETAMINOPHEN 10-325 MG PO TABS: 1.0000 | ORAL_TABLET | Freq: Three times a day (TID) | ORAL | 0 refills | Status: DC | PRN

## 2024-04-23 MED ORDER — ATORVASTATIN CALCIUM 40 MG PO TABS: 40.0000 mg | ORAL_TABLET | Freq: Every day | ORAL | 1 refills | Status: DC

## 2024-04-23 NOTE — Telephone Encounter (Signed)
 Copied from CRM [id number]. Topic: Clinical - Medication Refill >> Apr 23, 2024 10:22 AM Robinson H wrote: Medication: oxyCODONE -acetaminophen  (PERCOCET) 10-325 MG tablet, atorvastatin  (LIPITOR) 40 MG tablet  Has the patient contacted their pharmacy? Yes, provider has to approve OxyCodone  (Agent: If no, request that the patient contact the pharmacy for the refill. If patient does not wish to contact the pharmacy document the reason why and proceed with request.) (Agent: If yes, when and what did the pharmacy advise?)  This is the patient's preferred pharmacy:  [facility] [phone] [facility], [place] - [address] [place] [address] Phone: [phone] Fax: [phone]  Is this the correct pharmacy for this prescription? Yes If no, delete pharmacy and type the correct one.   Has the prescription been filled recently? No  Is the patient out of the medication? Yes  Has the patient been seen for an appointment in the last year OR does the patient have an upcoming appointment? Yes  Can we respond through MyChart? No  Agent: Please be advised that Rx refills may take up to 3 business days. We ask that you follow-up with your pharmacy.

## 2024-04-23 NOTE — Telephone Encounter (Signed)
 Requesting: oxycodone  10-325mg  Contract:12/01/21 UDS:12/01/21 Last Visit: 10/06/23 Next Visit:04/30/24 Last Refill: 03/24/24 #90 and 0RF   Please Advise

## 2024-04-30 ENCOUNTER — Encounter: Payer: Self-pay | Admitting: Family Medicine

## 2024-04-30 ENCOUNTER — Ambulatory Visit: Admitting: Family Medicine

## 2024-04-30 ENCOUNTER — Other Ambulatory Visit (HOSPITAL_BASED_OUTPATIENT_CLINIC_OR_DEPARTMENT_OTHER): Payer: Self-pay

## 2024-04-30 VITALS — BP 160/90 | HR 87 | Temp 98.4°F | Resp 18 | Ht 70.0 in | Wt 295.0 lb

## 2024-04-30 DIAGNOSIS — Z7985 Long-term (current) use of injectable non-insulin antidiabetic drugs: Secondary | ICD-10-CM

## 2024-04-30 DIAGNOSIS — E1165 Type 2 diabetes mellitus with hyperglycemia: Secondary | ICD-10-CM | POA: Diagnosis not present

## 2024-04-30 DIAGNOSIS — Z23 Encounter for immunization: Secondary | ICD-10-CM | POA: Diagnosis not present

## 2024-04-30 DIAGNOSIS — I10 Essential (primary) hypertension: Secondary | ICD-10-CM | POA: Diagnosis not present

## 2024-04-30 DIAGNOSIS — E1151 Type 2 diabetes mellitus with diabetic peripheral angiopathy without gangrene: Secondary | ICD-10-CM

## 2024-04-30 DIAGNOSIS — Z7984 Long term (current) use of oral hypoglycemic drugs: Secondary | ICD-10-CM

## 2024-04-30 DIAGNOSIS — E785 Hyperlipidemia, unspecified: Secondary | ICD-10-CM

## 2024-04-30 DIAGNOSIS — M25551 Pain in right hip: Secondary | ICD-10-CM | POA: Diagnosis not present

## 2024-04-30 DIAGNOSIS — I152 Hypertension secondary to endocrine disorders: Secondary | ICD-10-CM

## 2024-04-30 DIAGNOSIS — E1159 Type 2 diabetes mellitus with other circulatory complications: Secondary | ICD-10-CM

## 2024-04-30 MED ORDER — METFORMIN HCL 500 MG PO TABS: 1000.0000 mg | ORAL_TABLET | Freq: Two times a day (BID) | ORAL | 1 refills | Status: DC

## 2024-04-30 MED ORDER — TIRZEPATIDE 2.5 MG/0.5ML ~~LOC~~ SOAJ: 2.5000 mg | SUBCUTANEOUS | 0 refills | Status: DC

## 2024-04-30 MED ORDER — OLMESARTAN MEDOXOMIL-HCTZ 40-25 MG PO TABS: 1.0000 | ORAL_TABLET | Freq: Every day | ORAL | 1 refills | Status: DC

## 2024-04-30 MED ORDER — OXYCODONE-ACETAMINOPHEN 10-325 MG PO TABS: 1.0000 | ORAL_TABLET | Freq: Three times a day (TID) | ORAL | 0 refills | Status: DC | PRN

## 2024-04-30 MED ORDER — ATORVASTATIN CALCIUM 40 MG PO TABS: 40.0000 mg | ORAL_TABLET | Freq: Every day | ORAL | 1 refills | Status: AC

## 2024-04-30 MED ORDER — COMIRNATY 30 MCG/0.3ML IM SUSY
0.3000 mL | PREFILLED_SYRINGE | Freq: Once | INTRAMUSCULAR | 0 refills | Status: AC
  Filled 2024-04-30: qty 0.3, 1d supply, fill #0

## 2024-04-30 MED ORDER — TIZANIDINE HCL 4 MG PO TABS: 4.0000 mg | ORAL_TABLET | Freq: Four times a day (QID) | ORAL | 1 refills | Status: DC | PRN

## 2024-04-30 NOTE — Progress Notes (Signed)
 "  Subjective:    Patient ID: Richard [name], male    DOB: 06/13/1951, 73 y.o.   MRN: [id number]  Chief Complaint  Patient presents with   Diabetes   Hyperlipidemia   Follow-up    HPI Patient is in today for f/u dm, chol and bp.  Discussed the use of AI scribe software for clinical note transcription with the patient, who gave verbal consent to proceed.  History of Present Illness Richard [name] is a 73 year old male who presents with concerns about weight management for upcoming surgery.  He is concerned about his weight not decreasing in preparation for an upcoming surgery. He needs to reach a weight of 273 pounds and is currently at 295 pounds.  He has previously tried semaglutide  (Rybelsus ) but experienced adverse effects and could not tolerate it.  His blood sugar levels have been stable, with recent readings ranging from 80s to 104, and he feels well at these levels.  He discusses difficulties with medication refills through MyChart, noting issues with accessing his account due to changes requiring an Epic ID and password reset. He expresses frustration with the centralized call center for medication refills and prefers using MyChart for communication.  He recalls a past interaction with a weight loss clinic and mentions a disagreement with a staff member there, which led to him not returning since February.    Past Medical History:  Diagnosis Date   Allergy    seasonal   Arthritis     left knee,  right hip   Back pain    Bilateral swelling of feet    Complication of anesthesia    half awake during last colonscopy   Constipation    Diabetes mellitus    type 2   Heart murmur    Hx of adenomatous colonic polyps    2010 6 mm adenoma 10/06/2016 2 5 mm descending polyps    Hyperlipidemia    Hypertension    Joint pain    Lactose intolerance     Past Surgical History:  Procedure Laterality Date   COLONOSCOPY     COLONOSCOPY WITH PROPOFOL  N/A 10/06/2016    Procedure: COLONOSCOPY WITH PROPOFOL ;  Surgeon: Lupita FORBES Commander, MD;  Location: [facility];  Service: Endoscopy;  Laterality: N/A;   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   Procedure: TOTAL HIP ARTHROPLASTY ANTERIOR APPROACH;  Surgeon: Melodi Lerner, MD;  Location: [facility];  Service: Orthopedics;  Laterality: Right;    Family History  Problem Relation Age of Onset   Hyperlipidemia Mother    Hypertension Mother    Diabetes Mother    Diabetes Sister    Hypertension Sister    Stroke Sister    Diabetes Brother    Colon cancer Neg Hx    Colon polyps Neg Hx    Rectal cancer Neg Hx    Stomach cancer Neg Hx    Esophageal cancer Neg Hx     Social History   Socioeconomic History   Marital status: Married    Spouse name: Not on file   Number of children: Not on file   Years of education: Not on file   Highest education level: Not on file  Occupational History   Not on file  Tobacco Use   Smoking status: Never   Smokeless tobacco: Never  Vaping Use   Vaping status: Never Used  Substance and Sexual Activity   Alcohol use: Yes    Alcohol/week: 7.0 standard drinks of alcohol  Types: 7 Standard drinks or equivalent per week    Comment: daily   Drug use: No   Sexual activity: Not on file  Other Topics Concern   Not on file  Social History Narrative   Not on file   Social Drivers of Health   Financial Resource Strain: Not on file  Food Insecurity: No Food Insecurity (03/15/2023)   Hunger Vital Sign    Worried About Running Out of Food in the Last Year: Never true    Ran Out of Food in the Last Year: Never true  Transportation Needs: No Transportation Needs (03/15/2023)   PRAPARE - Administrator, Civil Service (Medical): No    Lack of Transportation (Non-Medical): No  Physical Activity: Not on file  Stress: Not on file  Social Connections: Not on file  Intimate Partner Violence: Not At Risk (03/15/2023)   Humiliation, Afraid, Rape, and Kick questionnaire     Fear of Current or Ex-Partner: No    Emotionally Abused: No    Physically Abused: No    Sexually Abused: No    Outpatient Medications Prior to Visit  Medication Sig Dispense Refill   Accu-Chek Softclix Lancets lancets Check blood sugars once daily 100 each 12   acetaminophen  (TYLENOL ) 500 MG tablet Take 500-1,000 mg by mouth every 6 (six) hours as needed for mild pain or moderate pain (pain.).     ammonium lactate  (AMLACTIN) 12 % cream Apply topically as needed for dry skin. 385 g 0   aspirin  81 MG chewable tablet Chew 1 tablet (81 mg total) by mouth 2 (two) times daily for 20 days. Then take one 81 mg aspirin  once a day for three weeks. Then discontinue aspirin . 63 tablet 0   Blood Glucose Monitoring Suppl (ACCU-CHEK GUIDE ME) w/Device KIT Check blood sugars once daily 1 kit 0   dapagliflozin  propanediol (FARXIGA ) 5 MG TABS tablet Take 1 tablet (5 mg total) by mouth daily. 90 tablet 1   diltiazem  (CARDIZEM  CD) 180 MG 24 hr capsule Take 1 capsule (180 mg total) by mouth daily. 90 capsule 1   furosemide  (LASIX ) 40 MG tablet TAKE 1 TABLET BY MOUTH EVERY DAY 30 tablet 3   glucose blood (ONETOUCH ULTRA) test strip Use to check sugars once daily 100 strip 12   glucose blood test strip Check blood sugars once daily 100 each 12   ibuprofen (ADVIL) 200 MG tablet Take 400 mg by mouth every 6 (six) hours as needed for moderate pain (pain score 4-6).     Insulin  Pen Needle (B-D UF III MINI PEN NEEDLES) 31G X 5 MM MISC USE AS DIRECTED 100 each 0   lubiprostone  (AMITIZA ) 24 MCG capsule Take 1 capsule (24 mcg total) by mouth 2 (two) times daily with a meal. (Patient taking differently: Take 24 mcg by mouth 2 (two) times daily as needed for constipation.) 60 capsule 5   triamcinolone  cream (KENALOG ) 0.1 % Apply 1 Application topically 2 (two) times daily. 80 g 5   atorvastatin  (LIPITOR) 40 MG tablet Take 1 tablet (40 mg total) by mouth daily. 90 tablet 1   metFORMIN  (GLUCOPHAGE ) 500 MG tablet Take 2 tablets  (1,000 mg total) by mouth 2 (two) times daily with a meal. 360 tablet 1   olmesartan -hydrochlorothiazide  (BENICAR  HCT) 40-25 MG tablet Take 1 tablet by mouth daily. 90 tablet 1   oxyCODONE -acetaminophen  (PERCOCET) 10-325 MG tablet Take 1 tablet by mouth every 8 (eight) hours as needed for pain. 90 tablet 0  tiZANidine  (ZANAFLEX ) 4 MG tablet Take 1 tablet (4 mg total) by mouth every 6 (six) hours as needed for muscle spasms. 90 tablet 1   ondansetron  (ZOFRAN ) 4 MG tablet Take 1 tablet (4 mg total) by mouth every 6 (six) hours as needed for nausea. (Patient not taking: Reported on 04/30/2024) 20 tablet 0   traMADol  (ULTRAM ) 50 MG tablet Take 1-2 tablets (50-100 mg total) by mouth every 6 (six) hours as needed for moderate pain. (Patient not taking: Reported on 04/30/2024) 40 tablet 0   Vitamin D , Ergocalciferol , (DRISDOL ) 1.25 MG (50000 UNIT) CAPS capsule Take 1 capsule (50,000 Units total) by mouth every 7 (seven) days. (Patient not taking: Reported on 04/30/2024) 4 capsule 0   No facility-administered medications prior to visit.    Allergies  Allergen Reactions   Semaglutide  Other (See Comments)    RYBELSUS . Severe headaches.     Review of Systems  Constitutional:  Negative for fever and malaise/fatigue.  HENT:  Negative for congestion.   Eyes:  Negative for blurred vision.  Respiratory:  Negative for shortness of breath.   Cardiovascular:  Negative for chest pain, palpitations and leg swelling.  Gastrointestinal:  Negative for abdominal pain, blood in stool and nausea.  Genitourinary:  Negative for dysuria and frequency.  Musculoskeletal:  Negative for falls.  Skin:  Negative for rash.  Neurological:  Negative for dizziness, loss of consciousness and headaches.  Endo/Heme/Allergies:  Negative for environmental allergies.  Psychiatric/Behavioral:  Negative for depression. The patient is not nervous/anxious.        Objective:    Physical Exam Vitals and nursing note reviewed.   Constitutional:      General: He is not in acute distress.    Appearance: Normal appearance. He is well-developed.  HENT:     Head: Normocephalic and atraumatic.  Eyes:     General: No scleral icterus.       Right eye: No discharge.        Left eye: No discharge.  Cardiovascular:     Rate and Rhythm: Normal rate and regular rhythm.     Heart sounds: No murmur heard. Pulmonary:     Effort: Pulmonary effort is normal. No respiratory distress.     Breath sounds: Normal breath sounds.  Musculoskeletal:        General: Normal range of motion.     Cervical back: Normal range of motion and neck supple.     Right lower leg: No edema.     Left lower leg: No edema.  Skin:    General: Skin is warm and dry.  Neurological:     Mental Status: He is alert and oriented to person, place, and time.  Psychiatric:        Mood and Affect: Mood normal.        Behavior: Behavior normal.        Thought Content: Thought content normal.        Judgment: Judgment normal.     BP (!) 160/90 (BP Location: Left Arm, Patient Position: Sitting, Cuff Size: Large)   Pulse 87   Temp 98.4 F (36.9 C) (Oral)   Resp 18   Ht 5' 10 (1.778 m)   Wt 295 lb (133.8 kg)   SpO2 97%   BMI 42.33 kg/m  Wt Readings from Last 3 Encounters:  04/30/24 295 lb (133.8 kg)  12/22/23 289 lb 6.4 oz (131.3 kg)  10/06/23 288 lb 12.8 oz (131 kg)    Diabetic Foot Exam -  Simple   No data filed    Lab Results  Component Value Date   WBC 5.4 04/30/2024   HGB 13.7 04/30/2024   HCT 41.0 04/30/2024   PLT 256.0 04/30/2024   GLUCOSE 90 04/30/2024   CHOL 135 04/30/2024   TRIG 63.0 04/30/2024   HDL 64.60 04/30/2024   LDLDIRECT 133.0 12/05/2017   LDLCALC 58 04/30/2024   ALT 9 04/30/2024   AST 14 04/30/2024   NA 136 04/30/2024   K 4.4 04/30/2024   CL 96 04/30/2024   CREATININE 0.92 04/30/2024   BUN 22 04/30/2024   CO2 29 04/30/2024   TSH 0.41 07/15/2023   PSA 0.25 12/01/2021   INR 1.0 07/15/2023   HGBA1C 5.9  04/30/2024   MICROALBUR 1.0 04/30/2024    Lab Results  Component Value Date   TSH 0.41 07/15/2023   Lab Results  Component Value Date   WBC 5.4 04/30/2024   HGB 13.7 04/30/2024   HCT 41.0 04/30/2024   MCV 98.5 04/30/2024   PLT 256.0 04/30/2024   Lab Results  Component Value Date   NA 136 04/30/2024   K 4.4 04/30/2024   CO2 29 04/30/2024   GLUCOSE 90 04/30/2024   BUN 22 04/30/2024   CREATININE 0.92 04/30/2024   BILITOT 0.8 04/30/2024   ALKPHOS 47 04/30/2024   AST 14 04/30/2024   ALT 9 04/30/2024   PROT 7.4 04/30/2024   ALBUMIN 4.6 04/30/2024   CALCIUM  9.6 04/30/2024   ANIONGAP 11 08/08/2023   GFR 83.82 04/30/2024   Lab Results  Component Value Date   CHOL 135 04/30/2024   Lab Results  Component Value Date   HDL 64.60 04/30/2024   Lab Results  Component Value Date   LDLCALC 58 04/30/2024   Lab Results  Component Value Date   TRIG 63.0 04/30/2024   Lab Results  Component Value Date   CHOLHDL 2 04/30/2024   Lab Results  Component Value Date   HGBA1C 5.9 04/30/2024       Assessment & Plan:  Type 2 diabetes mellitus with hyperglycemia, without long-term current use of insulin  (HCC) -     metFORMIN  HCl; Take 2 tablets (1,000 mg total) by mouth 2 (two) times daily with a meal.  Dispense: 360 tablet; Refill: 1 -     Tirzepatide ; Inject 2.5 mg into the skin once a week.  Dispense: 2 mL; Refill: 0 -     Comprehensive metabolic panel with GFR -     Hemoglobin A1c -     Microalbumin / creatinine urine ratio  Hyperlipidemia, unspecified hyperlipidemia type Assessment & Plan: Tolerating statin, encouraged heart healthy diet, avoid trans fats, minimize simple carbs and saturated fats. Increase exercise as tolerated   Orders: -     Atorvastatin  Calcium ; Take 1 tablet (40 mg total) by mouth daily.  Dispense: 90 tablet; Refill: 1 -     Lipid panel -     CBC with Differential/Platelet -     Comprehensive metabolic panel with GFR  Right hip pain -      tiZANidine  HCl; Take 1 tablet (4 mg total) by mouth every 6 (six) hours as needed for muscle spasms.  Dispense: 90 tablet; Refill: 1 -     oxyCODONE -Acetaminophen ; Take 1 tablet by mouth every 8 (eight) hours as needed for pain.  Dispense: 90 tablet; Refill: 0  Essential hypertension -     Olmesartan  Medoxomil-HCTZ; Take 1 tablet by mouth daily.  Dispense: 90 tablet; Refill: 1 -  Lipid panel -     CBC with Differential/Platelet -     Comprehensive metabolic panel with GFR  Need for influenza vaccination -     Flu vaccine HIGH DOSE PF(Fluzone Trivalent)  Hypertension associated with diabetes (HCC) Assessment & Plan: Well controlled, no changes to meds. Encouraged heart healthy diet such as the DASH diet and exercise as tolerated.     Assessment and Plan Assessment & Plan Obesity   Management is crucial for upcoming surgery. He currently weighs 295 lbs, with a target of 273 lbs. A previous trial of Rybelsus  was not tolerated due to adverse effects. Mounjaro , a combination medication, is considered as an alternative due to its different composition and potential for lower side effects. Start Mounjaro  at the lowest dose with weekly administration. He is instructed to notify the office before using the fourth pen to arrange for the next dose. Monitor weight loss progress and adjust dosage as needed.  Type 2 diabetes mellitus with diabetic peripheral angiopathy without gangrene   Blood sugar levels are well-controlled with recent readings ranging from 80s to 104 mg/dL.  Essential hypertension   Blood pressure readings are well-controlled with recent readings in the 90s to 104 mmHg range.  General Health Maintenance   A flu shot is due and administered today. COVID-19 vaccinations are available at the clinic.    Lacora Folmer R Lowne Chase, DO "

## 2024-05-01 LAB — CBC WITH DIFFERENTIAL/PLATELET
Basophils Absolute: 0 K/uL (ref 0.0–0.1)
Basophils Relative: 0.9 % (ref 0.0–3.0)
Eosinophils Absolute: 0.2 K/uL (ref 0.0–0.7)
Eosinophils Relative: 3.4 % (ref 0.0–5.0)
HCT: 41 % (ref 39.0–52.0)
Hemoglobin: 13.7 g/dL (ref 13.0–17.0)
Lymphocytes Relative: 18.8 % (ref 12.0–46.0)
Lymphs Abs: 1 K/uL (ref 0.7–4.0)
MCHC: 33.4 g/dL (ref 30.0–36.0)
MCV: 98.5 fl (ref 78.0–100.0)
Monocytes Absolute: 0.6 K/uL (ref 0.1–1.0)
Monocytes Relative: 11.3 % (ref 3.0–12.0)
Neutro Abs: 3.5 K/uL (ref 1.4–7.7)
Neutrophils Relative %: 65.6 % (ref 43.0–77.0)
Platelets: 256 K/uL (ref 150.0–400.0)
RBC: 4.16 Mil/uL — ABNORMAL LOW (ref 4.22–5.81)
RDW: 12.8 % (ref 11.5–15.5)
WBC: 5.4 K/uL (ref 4.0–10.5)

## 2024-05-01 LAB — COMPREHENSIVE METABOLIC PANEL WITH GFR
ALT: 9 U/L (ref 0–53)
AST: 14 U/L (ref 0–37)
Albumin: 4.6 g/dL (ref 3.5–5.2)
Alkaline Phosphatase: 47 U/L (ref 39–117)
BUN: 22 mg/dL (ref 6–23)
CO2: 29 meq/L (ref 19–32)
Calcium: 9.6 mg/dL (ref 8.4–10.5)
Chloride: 96 meq/L (ref 96–112)
Creatinine, Ser: 0.92 mg/dL (ref 0.40–1.50)
GFR: 83.82 mL/min (ref >60.00)
Glucose, Bld: 90 mg/dL (ref 70–99)
Potassium: 4.4 meq/L (ref 3.5–5.1)
Sodium: 136 meq/L (ref 135–145)
Total Bilirubin: 0.8 mg/dL (ref 0.2–1.2)
Total Protein: 7.4 g/dL (ref 6.0–8.3)

## 2024-05-01 LAB — LIPID PANEL
Cholesterol: 135 mg/dL (ref 0–200)
HDL: 64.6 mg/dL (ref >39.00)
LDL Cholesterol: 58 mg/dL (ref 0–99)
NonHDL: 70.81
Total CHOL/HDL Ratio: 2
Triglycerides: 63 mg/dL (ref 0.0–149.0)
VLDL: 12.6 mg/dL (ref 0.0–40.0)

## 2024-05-01 LAB — MICROALBUMIN / CREATININE URINE RATIO
Creatinine,U: 77.9 mg/dL
Microalb Creat Ratio: 12.4 mg/g (ref 0.0–30.0)
Microalb, Ur: 1 mg/dL (ref 0.0–1.9)

## 2024-05-01 LAB — HEMOGLOBIN A1C: Hgb A1c MFr Bld: 5.9 % (ref 4.6–6.5)

## 2024-05-03 ENCOUNTER — Ambulatory Visit: Payer: Self-pay | Admitting: Family Medicine

## 2024-05-03 NOTE — Assessment & Plan Note (Signed)
 Well controlled, no changes to meds. Encouraged heart healthy diet such as the DASH diet and exercise as tolerated.

## 2024-05-03 NOTE — Assessment & Plan Note (Signed)
 Tolerating statin, encouraged heart healthy diet, avoid trans fats, minimize simple carbs and saturated fats. Increase exercise as tolerated

## 2024-05-21 ENCOUNTER — Other Ambulatory Visit: Payer: Self-pay | Admitting: Family Medicine

## 2024-05-21 ENCOUNTER — Telehealth: Payer: Self-pay

## 2024-05-21 DIAGNOSIS — M25551 Pain in right hip: Secondary | ICD-10-CM

## 2024-05-21 NOTE — Telephone Encounter (Unsigned)
 Copied from CRM [id number]. Topic: Clinical - Medication Question >> May 21, 2024  2:11 PM Suzen RAMAN wrote: Reason for CRM: Pharmacy called to inquiry if a copy of a prescription was given to patient for oxyCODONE -acetaminophen  (PERCOCET) 10-325 MG tablet. Per pharmacy its dated for 04/30/24 with a handwritten signature. Pharmacy received an electronic script for a 90 day supply on 04/23/24 and then patient present in the pharmacy with a copy requesting another 90 days supply 04/30/24. Please contact pharmacy to confirm script is accurate.    CB#[phone]

## 2024-05-22 ENCOUNTER — Other Ambulatory Visit: Payer: Self-pay | Admitting: Family Medicine

## 2024-05-22 DIAGNOSIS — M25551 Pain in right hip: Secondary | ICD-10-CM

## 2024-05-22 MED ORDER — OXYCODONE-ACETAMINOPHEN 10-325 MG PO TABS: 1.0000 | ORAL_TABLET | Freq: Three times a day (TID) | ORAL | 0 refills | Status: DC | PRN

## 2024-05-22 NOTE — Telephone Encounter (Signed)
 Copied from CRM #[id number]. Topic: Clinical - Prescription Issue >> May 21, 2024  4:56 PM Winona R wrote: Pt at the pharmacy with a photo copied printed script for oxyCODONE -acetaminophen  (PERCOCET) 10-325 MG tablet however the pharmacy is stating it needs to be called in or on authintic scrip paper as it is a controlled substance. . Pharmacy also called with concerns as the office called in a refill on 11/10 and then printed a refill on 11/ 17 Pt is now trying to fill the 11/17 script. They accepted the other scripts as those request were not for controlled substances.  Please contact the pharmacy to correct this.

## 2024-05-22 NOTE — Telephone Encounter (Signed)
Pharmacy calling again regarding this.

## 2024-05-25 ENCOUNTER — Other Ambulatory Visit: Payer: Self-pay | Admitting: Family Medicine

## 2024-05-25 DIAGNOSIS — E1165 Type 2 diabetes mellitus with hyperglycemia: Secondary | ICD-10-CM

## 2024-05-25 NOTE — Telephone Encounter (Unsigned)
 Copied from CRM #[id number]. Topic: Clinical - Medication Refill >> May 25, 2024  1:31 PM Suzen RAMAN wrote: Medication: tirzepatide  (MOUNJARO ) 2.5 MG/0.5ML Pen   Has the patient contacted their pharmacy? Yes   This is the patient's preferred pharmacy:  [phone], [place] - [address] [place] [address] Phone: [phone] Fax: [phone]  Is this the correct pharmacy for this prescription? Yes If no, delete pharmacy and type the correct one.   Has the prescription been filled recently? No  Is the patient out of the medication? Yes  Has the patient been seen for an appointment in the last year OR does the patient have an upcoming appointment? Yes  Can we respond through MyChart? Yes  Agent: Please be advised that Rx refills may take up to 3 business days. We ask that you follow-up with your pharmacy.

## 2024-05-28 MED ORDER — TIRZEPATIDE 2.5 MG/0.5ML ~~LOC~~ SOAJ: 2.5000 mg | SUBCUTANEOUS | 1 refills | Status: AC

## 2024-06-19 ENCOUNTER — Other Ambulatory Visit: Payer: Self-pay | Admitting: Family Medicine

## 2024-06-19 DIAGNOSIS — M25551 Pain in right hip: Secondary | ICD-10-CM

## 2024-06-19 MED ORDER — OXYCODONE-ACETAMINOPHEN 10-325 MG PO TABS: 1.0000 | ORAL_TABLET | Freq: Three times a day (TID) | ORAL | 0 refills | Status: DC | PRN

## 2024-06-19 MED ORDER — TIZANIDINE HCL 4 MG PO TABS: 4.0000 mg | ORAL_TABLET | Freq: Four times a day (QID) | ORAL | 1 refills | Status: AC | PRN

## 2024-06-19 NOTE — Telephone Encounter (Signed)
 Copied from CRM [id number]. Topic: Clinical - Medication Refill >> Jun 19, 2024  3:48 PM Alexandria E wrote: Medication: oxyCODONE -acetaminophen  (PERCOCET) 10-325 MG tablet  tiZANidine  (ZANAFLEX ) 4 MG tablet   Has the patient contacted their pharmacy? No (Agent: If no, request that the patient contact the pharmacy for the refill. If patient does not wish to contact the pharmacy document the reason why and proceed with request.) (Agent: If yes, when and what did the pharmacy advise?)  This is the patient's preferred pharmacy:  [phone], [place] - [address] [place] [address] Phone: [phone] Fax: [phone]  Is this the correct pharmacy for this prescription? Yes If no, delete pharmacy and type the correct one.   Has the prescription been filled recently? No  Is the patient out of the medication? No  Has the patient been seen for an appointment in the last year OR does the patient have an upcoming appointment? Yes  Can we respond through MyChart? Yes  Agent: Please be advised that Rx refills may take up to 3 business days. We ask that you follow-up with your pharmacy.

## 2024-06-19 NOTE — Telephone Encounter (Signed)
 Requesting: oxycodone   Contract: 12/01/21 UDS: 12/01/21 Last Visit: 04/30/24 Next Visit: None Last Refill: 05/22/24 #90 and 0RF   Please Advise

## 2024-07-18 ENCOUNTER — Other Ambulatory Visit: Payer: Self-pay

## 2024-07-18 DIAGNOSIS — M25551 Pain in right hip: Secondary | ICD-10-CM

## 2024-07-18 DIAGNOSIS — E118 Type 2 diabetes mellitus with unspecified complications: Secondary | ICD-10-CM

## 2024-07-18 DIAGNOSIS — E1165 Type 2 diabetes mellitus with hyperglycemia: Secondary | ICD-10-CM

## 2024-07-18 DIAGNOSIS — I10 Essential (primary) hypertension: Secondary | ICD-10-CM

## 2024-07-18 MED ORDER — OLMESARTAN MEDOXOMIL-HCTZ 40-25 MG PO TABS: 1.0000 | ORAL_TABLET | Freq: Every day | ORAL | 1 refills | Status: AC

## 2024-07-18 MED ORDER — OXYCODONE-ACETAMINOPHEN 10-325 MG PO TABS: 1.0000 | ORAL_TABLET | Freq: Three times a day (TID) | ORAL | 0 refills | Status: AC | PRN

## 2024-07-18 MED ORDER — METFORMIN HCL 500 MG PO TABS: 1000.0000 mg | ORAL_TABLET | Freq: Two times a day (BID) | ORAL | 1 refills | Status: AC

## 2024-07-18 MED ORDER — DAPAGLIFLOZIN PROPANEDIOL 5 MG PO TABS: 5.0000 mg | ORAL_TABLET | Freq: Every day | ORAL | 1 refills | Status: AC

## 2024-07-18 NOTE — Telephone Encounter (Signed)
 Copied from CRM #[id number]. Topic: Clinical - Medication Refill >> Jul 18, 2024  9:48 AM Laymon HERO wrote: Medication: metFORMIN  (GLUCOPHAGE ) 500 MG tablet ;  oxyCODONE -acetaminophen  (PERCOCET) 10-325 MG tablet ;  dapagliflozin  propanediol (FARXIGA ) 5 MG TABS tablet ;  olmesartan -hydrochlorothiazide  (BENICAR  HCT) 40-25 MG tablet    Has the patient contacted their pharmacy? Yes (Agent: If no, request that the patient contact the pharmacy for the refill. If patient does not wish to contact the pharmacy document the reason why and proceed with request.) (Agent: If yes, when and what did the pharmacy advise?)  This is the patient's preferred pharmacy:  [facility] [address], [place] - [address] [place] [address] Phone: [phone] Fax: [phone]  Is this the correct pharmacy for this prescription? Yes If no, delete pharmacy and type the correct one.   Has the prescription been filled recently? Yes  Is the patient out of the medication? Yes  Has the patient been seen for an appointment in the last year OR does the patient have an upcoming appointment? Yes  Can we respond through MyChart? Yes  Agent: Please be advised that Rx refills may take up to 3 business days. We ask that you follow-up with your pharmacy.

## 2024-07-18 NOTE — Telephone Encounter (Signed)
 Requesting: Percocet Contract: 2023 UDS: 2023 Last OV: 04/30/2024 Next OV: n/a Last Refill: 06/19/2024, #[id number] RF Database:   Please advise

## 2024-07-18 NOTE — Addendum Note (Signed)
 Addended by: ELOUISE POWELL HERO on: 07/18/2024 11:05 AM   Modules accepted: Orders

## 2024-07-18 NOTE — Telephone Encounter (Signed)
 Rx refilled. Controlled med request sent to pcp for approval
